# Patient Record
Sex: Male | Born: 1955 | Race: White | Hispanic: No | Marital: Single | Smoking: Current some day smoker
Health system: Southern US, Community
[De-identification: ages and names within clinical notes are randomized; demographics above are authoritative.]

## PROBLEM LIST (undated history)

## (undated) VITALS — BP 98/57 | HR 63 | Temp 97.7°F | Resp 18 | Ht 74.0 in | Wt 186.1 lb

## (undated) DIAGNOSIS — I519 Heart disease, unspecified: Secondary | ICD-10-CM

## (undated) DIAGNOSIS — M5136 Other intervertebral disc degeneration, lumbar region: Secondary | ICD-10-CM

## (undated) DIAGNOSIS — I5189 Other ill-defined heart diseases: Secondary | ICD-10-CM

## (undated) DIAGNOSIS — E785 Hyperlipidemia, unspecified: Secondary | ICD-10-CM

## (undated) DIAGNOSIS — I509 Heart failure, unspecified: Secondary | ICD-10-CM

## (undated) DIAGNOSIS — I200 Unstable angina: Secondary | ICD-10-CM

## (undated) DIAGNOSIS — Z7902 Long term (current) use of antithrombotics/antiplatelets: Secondary | ICD-10-CM

## (undated) DIAGNOSIS — I2510 Atherosclerotic heart disease of native coronary artery without angina pectoris: Secondary | ICD-10-CM

## (undated) DIAGNOSIS — M51369 Other intervertebral disc degeneration, lumbar region without mention of lumbar back pain or lower extremity pain: Secondary | ICD-10-CM

## (undated) DIAGNOSIS — I70229 Atherosclerosis of native arteries of extremities with rest pain, unspecified extremity: Secondary | ICD-10-CM

## (undated) DIAGNOSIS — I10 Essential (primary) hypertension: Secondary | ICD-10-CM

## (undated) DIAGNOSIS — I70221 Atherosclerosis of native arteries of extremities with rest pain, right leg: Secondary | ICD-10-CM

## (undated) HISTORY — PX: CORONARY ANGIOPLASTY: SHX604

---

## 1978-04-10 HISTORY — PX: HAND SURGERY: SHX662

## 1978-04-10 HISTORY — PX: OTHER SURGICAL HISTORY: SHX169

## 2011-09-02 ENCOUNTER — Emergency Department: Payer: Self-pay | Admitting: *Deleted

## 2014-08-09 ENCOUNTER — Inpatient Hospital Stay (HOSPITAL_COMMUNITY)
Admission: EM | Admit: 2014-08-09 | Discharge: 2014-08-12 | DRG: 247 | Disposition: A | Discharge status: Home / Self Care | Payer: Self-pay | Source: Other Acute Inpatient Hospital | Attending: Cardiovascular Disease | Admitting: Cardiovascular Disease

## 2014-08-09 ENCOUNTER — Other Ambulatory Visit: Payer: Self-pay

## 2014-08-09 ENCOUNTER — Emergency Department
Admission: EM | Admit: 2014-08-09 | Discharge: 2014-08-09 | Disposition: A | Discharge status: Other Acute Inpatient Hospital | Payer: Self-pay | Attending: Cardiovascular Disease | Admitting: Cardiovascular Disease

## 2014-08-09 ENCOUNTER — Encounter (HOSPITAL_COMMUNITY): Payer: Self-pay | Admitting: *Deleted

## 2014-08-09 ENCOUNTER — Encounter: Payer: Self-pay | Admitting: *Deleted

## 2014-08-09 ENCOUNTER — Encounter
Admission: EM | Disposition: A | Discharge status: Other Acute Inpatient Hospital | Payer: Self-pay | Source: Home / Self Care | Attending: Emergency Medicine

## 2014-08-09 DIAGNOSIS — I2129 ST elevation (STEMI) myocardial infarction involving other sites: Secondary | ICD-10-CM | POA: Insufficient documentation

## 2014-08-09 DIAGNOSIS — I2510 Atherosclerotic heart disease of native coronary artery without angina pectoris: Secondary | ICD-10-CM

## 2014-08-09 DIAGNOSIS — I213 ST elevation (STEMI) myocardial infarction of unspecified site: Secondary | ICD-10-CM | POA: Diagnosis present

## 2014-08-09 DIAGNOSIS — Z9861 Coronary angioplasty status: Secondary | ICD-10-CM | POA: Insufficient documentation

## 2014-08-09 DIAGNOSIS — F17200 Nicotine dependence, unspecified, uncomplicated: Secondary | ICD-10-CM

## 2014-08-09 DIAGNOSIS — Z886 Allergy status to analgesic agent status: Secondary | ICD-10-CM

## 2014-08-09 DIAGNOSIS — Z720 Tobacco use: Secondary | ICD-10-CM | POA: Insufficient documentation

## 2014-08-09 DIAGNOSIS — F17210 Nicotine dependence, cigarettes, uncomplicated: Secondary | ICD-10-CM | POA: Diagnosis present

## 2014-08-09 DIAGNOSIS — I2121 ST elevation (STEMI) myocardial infarction involving left circumflex coronary artery: Secondary | ICD-10-CM

## 2014-08-09 DIAGNOSIS — E876 Hypokalemia: Secondary | ICD-10-CM

## 2014-08-09 HISTORY — PX: CARDIAC CATHETERIZATION: SHX172

## 2014-08-09 HISTORY — PX: CORONARY ANGIOPLASTY: SHX604

## 2014-08-09 HISTORY — DX: ST elevation (STEMI) myocardial infarction involving other sites: I21.29

## 2014-08-09 LAB — CBC
HCT: 41.8 % (ref 40.0–52.0)
HCT: 37.4 % — ABNORMAL LOW (ref 39.0–52.0)
HEMOGLOBIN: 14 g/dL (ref 13.0–18.0)
HEMOGLOBIN: 12.6 g/dL — AB (ref 13.0–17.0)
MCH: 30.7 pg (ref 26.0–34.0)
MCH: 30.3 pg (ref 26.0–34.0)
MCHC: 33.5 g/dL (ref 32.0–36.0)
MCHC: 33.7 g/dL (ref 30.0–36.0)
MCV: 91.5 fL (ref 80.0–100.0)
MCV: 89.9 fL (ref 78.0–100.0)
PLATELETS: 187 10*3/uL (ref 150–440)
Platelets: 172 10*3/uL (ref 150–400)
RBC: 4.57 MIL/uL (ref 4.40–5.90)
RBC: 4.16 MIL/uL — AB (ref 4.22–5.81)
RDW: 12.9 % (ref 11.5–14.5)
RDW: 12.8 % (ref 11.5–15.5)
WBC: 7 10*3/uL (ref 3.8–10.6)
WBC: 5.7 10*3/uL (ref 4.0–10.5)

## 2014-08-09 LAB — COMPREHENSIVE METABOLIC PANEL
ALK PHOS: 83 U/L (ref 38–126)
ALT: 24 U/L (ref 17–63)
ANION GAP: 11 (ref 5–15)
AST: 46 U/L — ABNORMAL HIGH (ref 15–41)
Albumin: 4.4 g/dL (ref 3.5–5.0)
BUN: 12 mg/dL (ref 6–20)
CHLORIDE: 100 mmol/L — AB (ref 101–111)
CO2: 28 mmol/L (ref 22–32)
Calcium: 11.1 mg/dL — ABNORMAL HIGH (ref 8.9–10.3)
Creatinine, Ser: 1.16 mg/dL (ref 0.61–1.24)
GFR calc Af Amer: >60 mL/min (ref >60)
GLUCOSE: 99 mg/dL (ref 65–99)
POTASSIUM: 3.1 mmol/L — AB (ref 3.5–5.1)
SODIUM: 139 mmol/L (ref 135–145)
Total Bilirubin: 1 mg/dL (ref 0.3–1.2)
Total Protein: 7.4 g/dL (ref 6.5–8.1)

## 2014-08-09 LAB — CREATININE, SERUM
Creatinine, Ser: 1.14 mg/dL (ref 0.61–1.24)
GFR calc Af Amer: >60 mL/min (ref >60)
GFR calc non Af Amer: >60 mL/min (ref >60)

## 2014-08-09 LAB — MRSA PCR SCREENING: MRSA by PCR: NEGATIVE

## 2014-08-09 LAB — APTT: aPTT: 29 seconds (ref 24–36)

## 2014-08-09 LAB — TROPONIN I: TROPONIN I: 2.25 ng/mL — AB (ref <0.031)

## 2014-08-09 SURGERY — CORONARY STENT INTERVENTION

## 2014-08-09 MED ORDER — BIVALIRUDIN 250 MG IV SOLR
INTRAVENOUS | Status: DC | PRN
  Administered 2014-08-09: 142.8 mg via INTRAVENOUS

## 2014-08-09 MED ORDER — TICAGRELOR 90 MG PO TABS
180.0000 mg | ORAL_TABLET | ORAL | Status: AC
  Administered 2014-08-09: 180 mg via ORAL

## 2014-08-09 MED ORDER — ONDANSETRON HCL 4 MG/2ML IJ SOLN: 4.0000 mg | Freq: Four times a day (QID) | INTRAMUSCULAR | Status: DC | PRN

## 2014-08-09 MED ORDER — SODIUM CHLORIDE 0.9 % IV SOLN
INTRAVENOUS | Status: DC
  Administered 2014-08-09: 20 mL via INTRAVENOUS

## 2014-08-09 MED ORDER — TICAGRELOR 90 MG PO TABS
ORAL_TABLET | ORAL | Status: AC
  Filled 2014-08-09: qty 2

## 2014-08-09 MED ORDER — VERAPAMIL HCL 2.5 MG/ML IV SOLN
INTRAVENOUS | Status: DC | PRN
  Administered 2014-08-09: 2.5 mg via INTRA_ARTERIAL

## 2014-08-09 MED ORDER — ACETAMINOPHEN 325 MG PO TABS: 650.0000 mg | ORAL_TABLET | ORAL | Status: DC | PRN

## 2014-08-09 MED ORDER — ALPRAZOLAM 0.25 MG PO TABS: 0.2500 mg | ORAL_TABLET | Freq: Two times a day (BID) | ORAL | Status: DC | PRN

## 2014-08-09 MED ORDER — NITROGLYCERIN 1 MG/10 ML FOR IR/CATH LAB
INTRA_ARTERIAL | Status: DC | PRN
  Administered 2014-08-09: 100 ug via INTRA_ARTERIAL
  Administered 2014-08-09: 200 ug via INTRA_ARTERIAL

## 2014-08-09 MED ORDER — SODIUM CHLORIDE 0.9 % IV SOLN: 3.0000 mL/kg/h | INTRAVENOUS | Status: AC

## 2014-08-09 MED ORDER — SODIUM CHLORIDE 0.9 % IJ SOLN
3.0000 mL | INTRAMUSCULAR | Status: DC | PRN
Start: 2014-08-09 — End: 2014-08-12

## 2014-08-09 MED ORDER — FENTANYL BOLUS VIA INFUSION
INTRAVENOUS | Status: DC | PRN
  Administered 2014-08-09: 50 ug via INTRAVENOUS

## 2014-08-09 MED ORDER — BIVALIRUDIN 250 MG IV SOLR
INTRAVENOUS | Status: DC | PRN
  Administered 2014-08-09: 20.4 mg via INTRAVENOUS

## 2014-08-09 MED ORDER — HEPARIN SODIUM (PORCINE) 5000 UNIT/ML IJ SOLN
4000.0000 [IU] | INTRAMUSCULAR | Status: AC
  Administered 2014-08-09: 4000 [IU] via INTRAVENOUS

## 2014-08-09 MED ORDER — TICAGRELOR 90 MG PO TABS: 90.0000 mg | ORAL_TABLET | Freq: Two times a day (BID) | ORAL | Status: DC

## 2014-08-09 MED ORDER — SODIUM CHLORIDE 0.9 % IV SOLN
0.1500 mg/kg/h | INTRAVENOUS | Status: DC
  Filled 2014-08-09: qty 250

## 2014-08-09 MED ORDER — HEPARIN SODIUM (PORCINE) 1000 UNIT/ML IJ SOLN
INTRAMUSCULAR | Status: DC | PRN
Start: 2014-08-09 — End: 2014-08-09
  Administered 2014-08-09: 2000 [IU] via INTRAVENOUS

## 2014-08-09 MED ORDER — MORPHINE SULFATE 4 MG/ML IJ SOLN: 4.0000 mg | INTRAMUSCULAR | Status: DC | PRN

## 2014-08-09 MED ORDER — IOHEXOL 350 MG/ML SOLN
INTRAVENOUS | Status: DC | PRN
  Administered 2014-08-09: 100 mL via INTRA_ARTERIAL
  Administered 2014-08-09: 125 mL via INTRA_ARTERIAL

## 2014-08-09 MED ORDER — TICAGRELOR 90 MG PO TABS
90.0000 mg | ORAL_TABLET | Freq: Two times a day (BID) | ORAL | Status: DC
  Administered 2014-08-09 – 2014-08-12 (×6): 90 mg via ORAL
  Filled 2014-08-09 (×7): qty 1

## 2014-08-09 MED ORDER — MORPHINE SULFATE 4 MG/ML IJ SOLN
INTRAMUSCULAR | Status: AC
  Administered 2014-08-09: 15:00:00
  Filled 2014-08-09: qty 1

## 2014-08-09 MED ORDER — ZOLPIDEM TARTRATE 5 MG PO TABS: 5.0000 mg | ORAL_TABLET | Freq: Every evening | ORAL | Status: DC | PRN

## 2014-08-09 MED ORDER — BIVALIRUDIN BOLUS VIA INFUSION
INTRAVENOUS | Status: DC | PRN
  Administered 2014-08-09: .75 mg/kg/h via INTRAVENOUS

## 2014-08-09 MED ORDER — ATORVASTATIN CALCIUM 80 MG PO TABS
80.0000 mg | ORAL_TABLET | Freq: Every day | ORAL | Status: DC
  Administered 2014-08-09 – 2014-08-11 (×3): 80 mg via ORAL
  Filled 2014-08-09 (×4): qty 1

## 2014-08-09 MED ORDER — SODIUM CHLORIDE 0.9 % IJ SOLN: 3.0000 mL | Freq: Two times a day (BID) | INTRAMUSCULAR | Status: DC

## 2014-08-09 MED ORDER — LISINOPRIL 5 MG PO TABS: 5.0000 mg | ORAL_TABLET | Freq: Every day | ORAL | Status: DC

## 2014-08-09 MED ORDER — POTASSIUM CHLORIDE CRYS ER 20 MEQ PO TBCR: 20.0000 meq | EXTENDED_RELEASE_TABLET | Freq: Once | ORAL | Status: DC

## 2014-08-09 MED ORDER — BIVALIRUDIN 250 MG IV SOLR
0.2500 mg/kg/h | INTRAVENOUS | Status: DC
  Filled 2014-08-09: qty 250

## 2014-08-09 MED ORDER — CARVEDILOL 3.125 MG PO TABS: 3.1250 mg | ORAL_TABLET | Freq: Two times a day (BID) | ORAL | Status: DC

## 2014-08-09 MED ORDER — MIDAZOLAM HCL 2 MG/2ML IJ SOLN
INTRAMUSCULAR | Status: DC | PRN
  Administered 2014-08-09: 1 mg via INTRAVENOUS

## 2014-08-09 MED ORDER — HEPARIN SODIUM (PORCINE) 1000 UNIT/ML IJ SOLN
INTRAMUSCULAR | Status: DC | PRN
  Administered 2014-08-09: 2000 [IU] via INTRAVENOUS

## 2014-08-09 MED ORDER — SODIUM CHLORIDE 0.9 % IV SOLN: 250.0000 mL | INTRAVENOUS | Status: DC | PRN

## 2014-08-09 MED ORDER — SODIUM CHLORIDE 0.9 % IV SOLN: INTRAVENOUS | Status: DC

## 2014-08-09 MED ORDER — ATORVASTATIN CALCIUM 20 MG PO TABS: 80.0000 mg | ORAL_TABLET | Freq: Every day | ORAL | Status: DC

## 2014-08-09 MED ORDER — HEPARIN SODIUM (PORCINE) 5000 UNIT/ML IJ SOLN
5000.0000 [IU] | Freq: Three times a day (TID) | INTRAMUSCULAR | Status: DC
  Administered 2014-08-10 – 2014-08-12 (×7): 5000 [IU] via SUBCUTANEOUS
  Filled 2014-08-09 (×10): qty 1

## 2014-08-09 MED ORDER — SODIUM CHLORIDE 0.9 % IJ SOLN: 3.0000 mL | INTRAMUSCULAR | Status: DC | PRN

## 2014-08-09 SURGICAL SUPPLY — 21 items
BALLN EUPHORA RX 2.5X12 (BALLOONS) ×3
BALLN ~~LOC~~ EUPHORA RX 3.5X8 (BALLOONS) ×3
BALLOON EUPHORA RX 2.5X12 (BALLOONS) ×1 IMPLANT
BALLOON ~~LOC~~ EUPHORA RX 3.5X8 (BALLOONS) ×1 IMPLANT
CATH HEARTRAIL 6F IL3.5 (CATHETERS) ×3 IMPLANT
CATH INFINITI 5FR ANG PIGTAIL (CATHETERS) ×3 IMPLANT
CATH INFINITI 5FR MULTPACK ANG (CATHETERS) IMPLANT
CATH OPTITORQUE JACKY 4.0 5F (CATHETERS) ×3 IMPLANT
DEVICE RAD COMP TR BAND LRG (VASCULAR PRODUCTS) ×3 IMPLANT
GLIDESHEATH SLEND SS 6F .021 (SHEATH) ×3 IMPLANT
KIT ENCORE 26 ADVANTAGE (KITS) ×3 IMPLANT
KIT HEART LEFT (KITS) ×3 IMPLANT
PACK CARDIAC CATHETERIZATION (CUSTOM PROCEDURE TRAY) ×3 IMPLANT
SHEATH PINNACLE 5F 10CM (SHEATH) IMPLANT
STENT RESOLUTE INTEG 3.0X15 (Permanent Stent) ×3 IMPLANT
SYR MEDRAD MARK V 150ML (SYRINGE) ×3 IMPLANT
TRANSDUCER W/STOPCOCK (MISCELLANEOUS) ×3 IMPLANT
TUBING CIL FLEX 10 FLL-RA (TUBING) ×3 IMPLANT
WIRE EMERALD 3MM-J .035X150CM (WIRE) IMPLANT
WIRE INTUITION PROPEL ST 180CM (WIRE) ×3 IMPLANT
WIRE SAFE-T 1.5MM-J .035X260CM (WIRE) ×3 IMPLANT

## 2014-08-09 NOTE — ED Notes (Signed)
Pt left ED via  EMS, report given to Cath lab at [facility].

## 2014-08-09 NOTE — ED Provider Notes (Signed)
[facility] Emergency Department Provider Note    ____________________________________________  Time seen: 2:52 PM I have reviewed the triage vital signs and the nursing notes.   HISTORY  Chief Complaint Chest Pain   EM caveat. History of exam is limited by acuity of condition. Patient presented with acute cardiovascular event requiring immediate stabilization and transfer.    HPI Aaron Knox is a 59 y.o. male presents with sudden onset of 30 minutes of substernal chest pain rated as a 10 out of 10. Pain is nonradiating, severe, associated with mild dyspnea sweats and nausea.  EM caveat history of present illness review systems exam is limited due to acuity of condition or need for emergent transfer   No past medical history on file.  There are no active problems to display for this patient.   No past surgical history on file.  No current outpatient prescriptions on file.  Allergies Review of patient's allergies indicates not on file.  No family history on file.  Social History History  Substance Use Topics  . Smoking status: Not on file  . Smokeless tobacco: Not on file  . Alcohol Use: Not on file   patient reports he is a long-standing smoker.    Review of Systems   EM caveat  Severe substernal chest pain starting 30 minutes ago as noted in history of present illness. Patient denies history of bleeding disorder. Patient does not take any medications for erectile dysfunction. Patient denies any other acute conditions in his normal state of health until approximately 30 minutes ago.    ____________________________________________   PHYSICAL EXAM:  VITAL SIGNS: ED Triage Vitals  Enc Vitals Group     BP --      Pulse --      Resp --      Temp --      Temp src --      SpO2 --      Weight --      Height --      Head Cir --      Peak Flow --      Pain Score --      Pain Loc --      Pain Edu? --      Excl. in GC? --       Constitutional: Alert and oriented. Well appearing and in no distress. Cardiovascular: Normal rate, regular rhythm. Normal and symmetric distal pulses are present in all extremities. No murmurs, rubs, or gallops. Respiratory: Normal respiratory effort without tachypnea nor retractions. Breath sounds are clear and equal bilaterally. No wheezes/rales/rhonchi. Gastrointestinal: Soft and nontender. No distention. No abdominal bruits. There is no CVA tenderness.Neurologic:  Normal speech and language. No gross focal neurologic deficits are appreciated. Speech is normal. No gait instability. Skin:  Skin is warm, dry and intact. No rash noted. Psychiatric: Mood and affect are normal. Speech and behavior are normal. Patient exhibits appropriate insight and judgment.  ____________________________________________   EKG  EKG demonstrates acute ST elevation MI. Heart rate 114. A 144 QRS 86 QTc 485 demonstrates diffuse ST depressions in V2 through V6 greater than 2 mm. This appears to be consistent with acute posterior myocardial infarction. _Discussed with Dr. Sloan Leiter  RADIOLOGY    258p discussed with Dr. Romilda Joy at [facility] accepted in transfer for STEMI service. ____________________________________________   PROCEDURES  Procedure(s) performed: None  Critical Care performed: Yes, see critical care note(s)  CRITICAL CARE Performed by: Sharyn Creamer   Total  critical care time: 35  Critical care time was exclusive of separately billable procedures and treating other patients.  Critical care was necessary to treat or prevent imminent or life-threatening deterioration.  Critical care was time spent personally by me on the following activities: development of treatment plan with patient and/or surrogate as well as nursing, discussions with consultants, evaluation of patient's response to treatment, examination of patient, obtaining history from patient or surrogate, ordering  and performing treatments and interventions, ordering and review of laboratory studies, ordering and review of radiographic studies, pulse oximetry and re-evaluation of patient's condition.   ____________________________________________   INITIAL IMPRESSION / ASSESSMENT AND PLAN / ED COURSE  Pertinent labs & imaging results that were available during my care of the patient were reviewed by me and considered in my medical decision making (see chart for details).  Patient presentation appears most consistent with acute ST elevation MI. Catheter lab is not open at [facility] at this time. Discussed with interventional cardiology, given patient's allergy to aspirin we will treat him with Brilinta, heparin, morphine, nitroglycerin and [facility].  Discussed the risks of transfer and the benefits with the patient who is very agreeable to transfer given his acute heart attack concern.  ----------------------------------------- 3:07 PM on 08/09/2014 ----------------------------------------- Patient vital signs remained stable pain is slightly improved however ongoing at this time. Patient has received heparin, morphine, nitroglycerin, Brilinta. Blood pressure 159/99, pulse 123, temperature 97.6 F (36.4 C), temperature source Oral, resp. rate 20, height 6\' 2"  (1.88 m), weight 180 lb (81.647 kg), SpO2 100 %.      ____________________________________________   FINAL CLINICAL IMPRESSION(S) / ED DIAGNOSES  Final diagnoses:  ST elevation myocardial infarction (STEMI) of true posterior wall, initial episode of care     Sharyn CreamerMark Cason Dabney, MD 08/09/14 [phone]

## 2014-08-09 NOTE — ED Notes (Signed)
Pt to ED from home with onset of right substernal chest pain that began this morning around 1130. Pt denies n/v/d. On assesment pt is AAOx3, lethargic, pale, SOB. Pain 10/10. Pt tachy at 135-155. MD quale at bedside

## 2014-08-09 NOTE — ED Notes (Signed)
CRITICAL VALUE ALERT  Critical value received:  Troponin 2.25  Date of notification:  08/09/14  Time of notification:  1535  Critical value read back:Yes.    Nurse who received alert:  Junie PanningLauryn RN  MD notified (1st page):  MD Fanny BienQuale

## 2014-08-09 NOTE — H&P (Signed)
History and Physical  Patient ID: Aaron Knox MRN: [id number] DOB/AGE: 61-Apr-1957 61 y.o. Admit date: 08/09/2014  Primary Care Physician: No primary care provider on file. Primary Cardiologist : New (will follow up with Dr. Kirke Corin in [place])  HPI:  This is a 59 year old male with no previous cardiac history. He has prolonged history of tobacco use and has been smoking one and a half pack per day since he was 59 years old. He has no chronic medical conditions and does not have a primary care physician. He does not take any medications. He started having intermittent chest pain on Wednesday which resolved without intervention. This continued since then but was not constant. However, today around 11 PM, he started having severe substernal chest tightness associated with shortness of breath with no radiation. The pain was severe and thus he went [facility] the emergency room at [facility]. His ECG showed evidence of posterior myocardial infarction on thus he was transferred emergently. The patient received heparin and Bilinta. He reports allergy to aspirin with previous throat swelling. By the time he arrived to the cath lab, his chest pain was down to 2 out of 10.  Review of systems complete and found to be negative unless listed above  History reviewed. No pertinent past medical history.  History reviewed. No pertinent family history.  History   Social History  . Marital Status: Single    Spouse Name: N/A  . Number of Children: N/A  . Years of Education: N/A   Occupational History  . Not on file.   Social History Main Topics  . Smoking status: Current Every Day Smoker -- 1.00 packs/day    Types: Cigarettes  . Smokeless tobacco: Not on file  . Alcohol Use: 1.8 oz/week    3 Glasses of wine per week  . Drug Use: No  . Sexual Activity: Not on file   Other Topics Concern  . Not on file   Social History Narrative    Past Surgical History  Procedure Laterality Date  . Coronary  angioplasty        (Not in a hospital admission)  Physical Exam: Blood pressure 145/95, pulse 98, temperature 97.6 F (36.4 C), temperature source Oral, resp. rate 25, height  (1.88 m), weight 81.647 kg (180 lb), SpO2 100 %.  Constitutional: He is oriented to person, place, and time. He appears well-developed and well-nourished. No distress.  HENT: No nasal discharge.  Head: Normocephalic and atraumatic.  Eyes: Pupils are equal and round.  No discharge. Neck: Normal range of motion. Neck supple. No JVD present. No thyromegaly present.  Cardiovascular: Normal rate, regular rhythm, normal heart sounds. Exam reveals no gallop and no friction rub. No murmur heard.  Pulmonary/Chest: Effort normal and breath sounds normal. No stridor. No respiratory distress. He has no wheezes. He has no rales. He exhibits no tenderness.  Abdominal: Soft. Bowel sounds are normal. He exhibits no distension. There is no tenderness. There is no rebound and no guarding.  Musculoskeletal: Normal range of motion. He exhibits no edema and no tenderness.  Neurological: He is alert and oriented to person, place, and time. Coordination normal.  Skin: Skin is warm and dry. No rash noted. He is not diaphoretic. No erythema. No pallor.  Psychiatric: He has a normal mood and affect. His behavior is normal. Judgment and thought content normal.      Labs:   Lab Results  Component Value Date   WBC 7.0 08/09/2014   HGB 14.0 08/09/2014  HCT 41.8 08/09/2014   MCV 91.5 08/09/2014   PLT 187 08/09/2014    Recent Labs Lab 08/09/14 1452  NA 139  K 3.1*  CL 100*  CO2 28  BUN 12  CREATININE 1.16  CALCIUM 11.1*  PROT 7.4  BILITOT 1.0  ALKPHOS 83  ALT 24  AST 46*  GLUCOSE 99   Lab Results  Component Value Date   TROPONINI 2.25* 08/09/2014   Radiology: No results found.  EKG:  Sinus tachycardia with significant ST depression in the precordial leads suggestive of posterior myocardial  infarction.  ASSESSMENT AND PLAN:   1. Posterior ST elevation myocardial infarction: Emergent cardiac catheterization was done via the right radial artery which showed 99% subtotal occlusion in the proximal left circumflex which was the culprit for presentation. He underwent successful angioplasty and drug-eluting stent placement. He has residual mild to moderate coronary artery disease in the LAD, left circumflex and RCA. Given that he is truly allergic to aspirin, he was treated with Bilinta monotherapy. This should be continued for a minimum of 12 months with the option of switching to clopidogrel after that. I started high dose atorvastatin.  Aggressive treatment of risk factors recommended.  2. Ischemic cardiomyopathy with an ejection fraction of 40%: I started small dose carvedilol and lisinopril. I ordered an echocardiogram.  3. Tobacco use: I strongly advised him to quit smoking.  Signed: Lorine BearsMuhammad Katey Barrie MD, [facility] 08/09/2014, 5:18 PM

## 2014-08-10 ENCOUNTER — Encounter (HOSPITAL_COMMUNITY): Payer: Self-pay | Admitting: Cardiovascular Disease

## 2014-08-10 DIAGNOSIS — I2121 ST elevation (STEMI) myocardial infarction involving left circumflex coronary artery: Secondary | ICD-10-CM

## 2014-08-10 LAB — CBC
HEMATOCRIT: 36.1 % — AB (ref 39.0–52.0)
Hemoglobin: 12.3 g/dL — ABNORMAL LOW (ref 13.0–17.0)
MCH: 31 pg (ref 26.0–34.0)
MCHC: 34.1 g/dL (ref 30.0–36.0)
MCV: 90.9 fL (ref 78.0–100.0)
Platelets: 160 10*3/uL (ref 150–400)
RBC: 3.97 MIL/uL — ABNORMAL LOW (ref 4.22–5.81)
RDW: 12.7 % (ref 11.5–15.5)
WBC: 6.1 10*3/uL (ref 4.0–10.5)

## 2014-08-10 LAB — BASIC METABOLIC PANEL
Anion gap: 9 (ref 5–15)
BUN: 8 mg/dL (ref 6–20)
CHLORIDE: 104 mmol/L (ref 101–111)
CO2: 26 mmol/L (ref 22–32)
CREATININE: 1.18 mg/dL (ref 0.61–1.24)
Calcium: 9.5 mg/dL (ref 8.9–10.3)
GFR calc Af Amer: >60 mL/min (ref >60)
Glucose, Bld: 101 mg/dL — ABNORMAL HIGH (ref 70–99)
Potassium: 3.4 mmol/L — ABNORMAL LOW (ref 3.5–5.1)
Sodium: 139 mmol/L (ref 135–145)

## 2014-08-10 LAB — POCT ACTIVATED CLOTTING TIME: Activated Clotting Time: 589 seconds

## 2014-08-10 MED ORDER — POTASSIUM CHLORIDE CRYS ER 20 MEQ PO TBCR
40.0000 meq | EXTENDED_RELEASE_TABLET | Freq: Two times a day (BID) | ORAL | Status: DC
  Administered 2014-08-10 – 2014-08-11 (×3): 40 meq via ORAL
  Filled 2014-08-10 (×4): qty 2

## 2014-08-10 MED FILL — Midazolam HCl Inj 2 MG/2ML (Base Equivalent): INTRAMUSCULAR | Qty: 2 | Status: AC

## 2014-08-10 MED FILL — Bivalirudin For IV Soln 250 MG: INTRAVENOUS | Qty: 250 | Status: AC

## 2014-08-10 MED FILL — Fentanyl Citrate Preservative Free (PF) Inj 100 MCG/2ML: INTRAMUSCULAR | Qty: 2 | Status: AC

## 2014-08-10 MED FILL — Nitroglycerin IV Soln 100 MCG/ML in D5W: INTRA_ARTERIAL | Qty: 10 | Status: AC

## 2014-08-10 MED FILL — Verapamil HCl IV Soln 2.5 MG/ML: INTRAVENOUS | Qty: 2 | Status: AC

## 2014-08-10 NOTE — Care Management Note (Addendum)
Case Management Note  Patient Details  Name: Aaron Knox MRN: [id number] Date of Birth: [birth year]  Subjective/Objective:                    Action/Plan:   Expected Discharge Date:                  Expected Discharge Plan:  Home/Self Care  In-House Referral:     Discharge planning Services  CM Consult, Medication Assistance, Indigent Health Clinic  Post Acute Care Choice:    Choice offered to:     DME Arranged:    DME Agency:     HH Arranged:    HH Agency:     Status of Service:  Completed, signed off  Medicare Important Message Given:    Date Medicare IM Given:    Medicare IM give by:    Date Additional Medicare IM Given:    Additional Medicare Important Message give by:     If discussed at Long Length of Stay Meetings, dates discussed:    Additional Comments: spoke w pt. 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 Gave pt 30day brilinta card. Placed brilinta pt assist form on shadow chart. Gave pt inform on [place] [facility] where he could establish for pcp.  Hanley Haysowell, Kayron Kalmar T, RN 08/10/2014, 10:00 AM

## 2014-08-10 NOTE — Progress Notes (Signed)
CARDIAC REHAB PHASE I   PRE:  Rate/Rhythm: 70 SR    BP: sitting 113/71 (just out of bathroom)    SaO2:   MODE:  Ambulation: 350 ft   POST:  Rate/Rhythm: 98 SR    BP: sitting 96/60     SaO2:   Pt eager to walk. Denies c/o. No SOB or dizziness. To bed, BP low. Began discussing MI/stent. Reinforced Brilinta. Pt is wanting to quit smoking and discussed ways to quit. Will f/u tomorrow for more ed. [id number]  Aaron LovettReeve, Aaron Knox Aaron Knox CES, ACSM 08/10/2014 1:36 PM

## 2014-08-10 NOTE — Progress Notes (Signed)
Patient ID: Aaron BleacherGary W Helmes, male   DOB: 01-24-1956, 59 y.o.   MRN: [id number]    Subjective:  Denies SSCP, palpitations or Dyspnea Allergic to aspirin.    Objective:  Filed Vitals:   08/10/14 0600 08/10/14 0630 08/10/14 0700 08/10/14 0705  BP: 86/54 82/50 63/32  83/62  Pulse: 68 59 62 71  Temp:      TempSrc:      Resp: 15 17 19 20   Height:      Weight:      SpO2: 97% 96% 96% 98%    Intake/Output from previous day:  Intake/Output Summary (Last 24 hours) at 08/10/14 [id number] Last data filed at 08/10/14 0800  Gross per 24 hour  Intake    826 ml  Output   1150 ml  Net   -324 ml    Physical Exam: Affect appropriate Healthy:  appears stated age HEENT: normal Neck supple with no adenopathy JVP normal no bruits no thyromegaly Lungs clear with no wheezing and good diaphragmatic motion Heart:  S1/S2 no murmur, no rub, gallop or click PMI normal Abdomen: benighn, BS positve, no tenderness, no AAA no bruit.  No HSM or HJR Distal pulses intact with no bruits No edema Neuro non-focal Skin warm and dry No muscular weakness   Lab Results: Basic Metabolic Panel:  Recent Labs  56/21/3003/04/26 1452 08/09/14 2039 08/10/14 0242  NA 139  --  139  K 3.1*  --  3.4*  CL 100*  --  104  CO2 28  --  26  GLUCOSE 99  --  101*  BUN 12  --  8  CREATININE 1.16 1.14 1.18  CALCIUM 11.1*  --  9.5   Liver Function Tests:  Recent Labs  08/09/14 1452  AST 46*  ALT 24  ALKPHOS 83  BILITOT 1.0  PROT 7.4  ALBUMIN 4.4   CBC:  Recent Labs  08/09/14 2039 08/10/14 0242  WBC 5.7 6.1  HGB 12.6* 12.3*  HCT 37.4* 36.1*  MCV 89.9 90.9  PLT 172 160   Cardiac Enzymes:  Recent Labs  08/09/14 1452  TROPONINI 2.25*   BNP: Invalid input(s): POCBNP D-Dimer: No results for input(s): DDIMER in the last 72 hours. Hemoglobin A1C: No results for input(s): HGBA1C in the last 72 hours. Fasting Lipid Panel: No results for input(s): CHOL, HDL, LDLCALC, TRIG, CHOLHDL, LDLDIRECT in the last  72 hours. Thyroid Function Tests: No results for input(s): TSH, T4TOTAL, T3FREE, THYROIDAB in the last 72 hours.  Invalid input(s): FREET3 Anemia Panel: No results for input(s): VITAMINB12, FOLATE, FERRITIN, TIBC, IRON, RETICCTPCT in the last 72 hours.  Imaging: No results found.  Cardiac Studies:  ECG:  SR QT 537c  Inferolateral T wave changes   Telemetry:  SB rate 60 no VT   Echo:  pending  Medications:   . atorvastatin  80 mg Oral q1800  . heparin  5,000 Units Subcutaneous 3 times per day  . ticagrelor  90 mg Oral BID       Assessment/Plan:  CAD:  Posterior MI no murmur Echo pending allergic to aspirin ticagrelor monoRx.  No beta blocker due to bradycardia Smoking :  Discussed cessation no nicotine patch in setting of acute MI Chol:  On statin  Tentative d/c 48 hrs  Charlton Hawseter Masaji Billups 08/10/2014, 8:33 AM

## 2014-08-11 ENCOUNTER — Inpatient Hospital Stay: Payer: MEDICAID

## 2014-08-11 ENCOUNTER — Inpatient Hospital Stay (HOSPITAL_COMMUNITY): Payer: Self-pay

## 2014-08-11 DIAGNOSIS — E876 Hypokalemia: Secondary | ICD-10-CM

## 2014-08-11 DIAGNOSIS — I2129 ST elevation (STEMI) myocardial infarction involving other sites: Principal | ICD-10-CM

## 2014-08-11 DIAGNOSIS — I2510 Atherosclerotic heart disease of native coronary artery without angina pectoris: Secondary | ICD-10-CM

## 2014-08-11 DIAGNOSIS — F17200 Nicotine dependence, unspecified, uncomplicated: Secondary | ICD-10-CM

## 2014-08-11 LAB — BASIC METABOLIC PANEL
ANION GAP: 10 (ref 5–15)
BUN: 11 mg/dL (ref 6–20)
CO2: 26 mmol/L (ref 22–32)
Calcium: 9.7 mg/dL (ref 8.9–10.3)
Chloride: 105 mmol/L (ref 101–111)
Creatinine, Ser: 1.07 mg/dL (ref 0.61–1.24)
GFR calc Af Amer: >60 mL/min (ref >60)
GFR calc non Af Amer: >60 mL/min (ref >60)
GLUCOSE: 91 mg/dL (ref 70–99)
POTASSIUM: 4.6 mmol/L (ref 3.5–5.1)
SODIUM: 141 mmol/L (ref 135–145)

## 2014-08-11 MED FILL — Heparin Sodium (Porcine) 2 Unit/ML in Sodium Chloride 0.9%: INTRAMUSCULAR | Qty: 1500 | Status: AC

## 2014-08-11 MED FILL — Lidocaine HCl Local Preservative Free (PF) Inj 1%: INTRAMUSCULAR | Qty: 30 | Status: AC

## 2014-08-11 NOTE — Progress Notes (Signed)
Echocardiogram 2D Echocardiogram has been performed.  Dorothey BasemanReel, Skyy Mcknight M 08/11/2014, 12:12 PM

## 2014-08-11 NOTE — Progress Notes (Signed)
CARDIAC REHAB PHASE I   PRE:  Rate/Rhythm: 62 SR    BP: sitting 105/62    SaO2:   MODE:  Ambulation: 700 ft   POST:  Rate/Rhythm: 108 ST with fast week    BP: sitting 112/70     SaO2:   Pt wanted to walk fast pace, able to do so twice around unit without sx. Sts he feels normal. HR 108 ST, BP stable. Ed completed. Pt motivated to quit smoking. Not interested in CRPII as he wants to ex on his own. [id number]   Elissa LovettReeve, Cresencio Reesor ValmyKristan CES, ACSM 08/11/2014 1:15 PM

## 2014-08-11 NOTE — Progress Notes (Signed)
Patient ID: Aaron BleacherGary W Knox, male   DOB: June 04, 1955, 59 y.o.   MRN: [id number]    Subjective:  [name] was seen and examined this morning.  He denies CP, palpitation, dyspnea or lightheadedness.  His appetite, bowel and bladder are normal.  He ambulated around unit yesterday without problem.   Objective:  Filed Vitals:   08/11/14 0000 08/11/14 0200 08/11/14 0400 08/11/14 0600  BP: 89/56 84/43 101/57 96/49  Pulse:      Temp: 98.4 F (36.9 C)  98.2 F (36.8 C)   TempSrc: Oral  Oral   Resp:      Height:      Weight:      SpO2: 95%  96%     Intake/Output from previous day:  Intake/Output Summary (Last 24 hours) at 08/11/14 0810 Last data filed at 08/10/14 2000  Gross per 24 hour  Intake    860 ml  Output      0 ml  Net    860 ml    Physical Exam: General: sitting up in chair, in NAD, pleasant and cooperative with exam HEENT: [name], MMM, no JVD, no thyromegaly, no carotid bruits Cardiac: RRR, no rubs, murmurs or gallops; distal pulses intact and equal Pulm: clear to auscultation bilaterally, moving normal volumes of air, no wheezes/rales/rhonchi Abd: soft, nontender, nondistended, BS present Ext: warm and well perfused, no pedal edema Neuro: alert and oriented X3, moving extremities spontaneously, 5/5 MMS upper and lower extremities  Lab Results: Basic Metabolic Panel:  Recent Labs  40/98/1103/04/26 1452 08/09/14 2039 08/10/14 0242  NA 139  --  139  K 3.1*  --  3.4*  CL 100*  --  104  CO2 28  --  26  GLUCOSE 99  --  101*  BUN 12  --  8  CREATININE 1.16 1.14 1.18  CALCIUM 11.1*  --  9.5   Liver Function Tests:  Recent Labs  08/09/14 1452  AST 46*  ALT 24  ALKPHOS 83  BILITOT 1.0  PROT 7.4  ALBUMIN 4.4   CBC:  Recent Labs  08/09/14 2039 08/10/14 0242  WBC 5.7 6.1  HGB 12.6* 12.3*  HCT 37.4* 36.1*  MCV 89.9 90.9  PLT 172 160   Cardiac Enzymes:  Recent Labs  08/09/14 1452  TROPONINI 2.25*   BNP: Invalid input(s): POCBNP D-Dimer: No  results for input(s): DDIMER in the last 72 hours. Hemoglobin A1C: No results for input(s): HGBA1C in the last 72 hours. Fasting Lipid Panel: No results for input(s): CHOL, HDL, LDLCALC, TRIG, CHOLHDL, LDLDIRECT in the last 72 hours. Thyroid Function Tests: No results for input(s): TSH, T4TOTAL, T3FREE, THYROIDAB in the last 72 hours.  Invalid input(s): FREET3 Anemia Panel: No results for input(s): VITAMINB12, FOLATE, FERRITIN, TIBC, IRON, RETICCTPCT in the last 72 hours.  Imaging: No results found.  Cardiac Studies:  ECG:  SR QT 537c  Inferolateral T wave changes             Telemetry:  Sinus 70-90s no VT   Echo:  pending  Medications:   . atorvastatin  80 mg Oral q1800  . heparin  5,000 Units Subcutaneous 3 times per day  . potassium chloride  40 mEq Oral BID  . ticagrelor  90 mg Oral BID       Assessment/Plan:  Posterior MI s/p DES LCX:  He is on ticagrelor because of ASA allergy.  He is not on a beta blocker due to bradycardia.  Atorvastatin 80mg  was started this  admission.  There are no murmurs on cardiac exam.  2D ECHO is pending.  Will repeat EKG to check QTc.  He is stable for transfer to telemetry today with expected discharge home tomorrow. Tobacco abuse:  Encouraged cessation.  Patient interested in quitting.  No nicotine patch in setting of acute MI. Hypokalemia:  Improving but still low at 3.4.  Continue BID po K supplement while inpatient.  Recheck BMP tomorrow AM.  Doubt he will need continued supplementation at discharge.  Tentative d/c 24 hrs  Evelena Peat, DO IMTS, PGY2 08/11/2014, 8:10 AM   Patient examined chart reviewed  Suspect EF about 45%  RAO v gram would not show lateral infarcted wall No pain transfer to floor.  Tent d/c in am.  Discussed smoking cessation  Supple K no murmur on exam  Charlton Haws

## 2014-08-12 LAB — BASIC METABOLIC PANEL
ANION GAP: 9 (ref 5–15)
BUN: 13 mg/dL (ref 6–20)
CALCIUM: 8.9 mg/dL (ref 8.9–10.3)
CO2: 25 mmol/L (ref 22–32)
Chloride: 102 mmol/L (ref 101–111)
Creatinine, Ser: 0.99 mg/dL (ref 0.61–1.24)
GFR calc non Af Amer: >60 mL/min (ref >60)
Glucose, Bld: 99 mg/dL (ref 70–99)
Potassium: 3.8 mmol/L (ref 3.5–5.1)
SODIUM: 136 mmol/L (ref 135–145)

## 2014-08-12 MED ORDER — TICAGRELOR 90 MG PO TABS: 90.0000 mg | ORAL_TABLET | Freq: Two times a day (BID) | ORAL | Status: DC

## 2014-08-12 MED ORDER — ATORVASTATIN CALCIUM 80 MG PO TABS: 80.0000 mg | ORAL_TABLET | Freq: Every day | ORAL | Status: DC

## 2014-08-12 NOTE — Progress Notes (Signed)
    Subjective: No CP or SOB  Objective: Vital signs in last 24 hours: Temp:  [97.5 F (36.4 C)-98.3 F (36.8 C)] 97.7 F (36.5 C) (05/04 0344) Pulse Rate:  [63-80] 63 (05/04 0344) Resp:  [15-18] 18 (05/04 0344) BP: (98-122)/(42-62) 98/57 mmHg (05/04 0344) SpO2:  [94 %-100 %] 94 % (05/04 0344) Last BM Date: 08/10/14  Intake/Output from previous day: 05/03 0701 - 05/04 0700 In: 1320 [P.O.:1320] Out: 1850 [Urine:1850] Intake/Output this shift:    Medications Scheduled Meds: . atorvastatin  80 mg Oral q1800  . heparin  5,000 Units Subcutaneous 3 times per day  . ticagrelor  90 mg Oral BID   Continuous Infusions:  PRN Meds:.sodium chloride, acetaminophen, ALPRAZolam, morphine injection, ondansetron (ZOFRAN) IV, sodium chloride, zolpidem  PE: General appearance: alert, cooperative and no distress Lungs: clear to auscultation bilaterally Heart: regular rate and rhythm, S1, S2 normal, no murmur, click, rub or gallop Extremities: No LEE Pulses: 2+ and symmetric Skin: Warm and dry.  Cath site stable.  Neurologic: Grossly normal  Lab Results:   Recent Labs  08/09/14 1452 08/09/14 2039 08/10/14 0242  WBC 7.0 5.7 6.1  HGB 14.0 12.6* 12.3*  HCT 41.8 37.4* 36.1*  PLT 187 172 160   BMET  Recent Labs  08/10/14 0242 08/11/14 1215 08/12/14 0335  NA 139 141 136  K 3.4* 4.6 3.8  CL 104 105 102  CO2 26 26 25   GLUCOSE 101* 91 99  BUN 8 11 13   CREATININE 1.18 1.07 0.99  CALCIUM 9.5 9.7 8.9     Assessment/Plan   Principal Problem:   ST elevation myocardial infarction (STEMI) of true posterior wall Active Problems:   Hypokalemia   Tobacco abuse  Posterior MI s/p DES LCX: He is on ticagrelor.   Has ASA allergy(Years ago-throat swelled.  Never took it since). He is not on a beta blocker due to bradycardia. Atorvastatin 80mg  was started this admission. 2D ECHO:  EF 45-50%. Mild LVH, Inferior hypokinesis, G1DD. Repeat EKG, QTc-4257ms.   Ambulated 73000ft  yesterday at quick pace without difficulties.  Not interested in CRPII.    Tobacco abuse: Encouraged cessation. Patient interested in quitting. Doing well without nicotine patch.  Hypokalemia: WNL now.     Looks great.  DC home today.  [place] in [place] with Dr. Kirke CorinArida.      LOS: 3 days    HAGER, BRYAN PA-C 08/12/2014 8:37 AM  Patient examined chart reviewed Type A circumflex lesion nice result  DAT  No murmurs D/C home  Charlton HawsPeter Alexys Gassett

## 2014-08-12 NOTE — Progress Notes (Signed)
Pt discharging to discharge lounge via wheelchair, escorted by staff. Daughter to pick him up after her class. Discharge instructions provided to patient with emphasis on follow up care, cardiac diet and smoking cessation; verbalizes understanding and able to provide appropriate teach back. PIVs discontinued, sites without s/s of complication. Tele box removed and returned to unit. Denies needs or questions at this time.

## 2014-08-12 NOTE — Discharge Summary (Signed)
Physician Discharge Summary    Cardiologist:  Kirke CorinArida: new  Patient ID: Aaron BleacherGary W Knox MRN: [id number] DOB/AGE: [birth year] 59 y.o.  Admit date: 08/09/2014 Discharge date: 08/12/2014  Admission Diagnoses:  STEMI  Discharge Diagnoses:  Principal Problem:   ST elevation myocardial infarction (STEMI) of true posterior wall Active Problems:   Hypokalemia   Tobacco abuse   Discharged Condition: stable  Hospital Course:   This is a 59 year old male with no previous cardiac history. He has prolonged history of tobacco use and has been smoking one and a half pack per day since he was 59 years old. He has no chronic medical conditions and does not have a primary care physician. He does not take any medications. He started having intermittent chest pain on Wednesday which resolved without intervention. This continued since then but was not constant. However, today around 11 PM, he started having severe substernal chest tightness associated with shortness of breath with no radiation. The pain was severe and thus he went [facility]. His ECG showed evidence of posterior myocardial infarction on thus he was transferred emergently. The patient received heparin and Bilinta. He reports allergy to aspirin with previous throat swelling. By the time he arrived to the cath lab, his chest pain was down to 2 out of 10.  The left heart cath revealed prox Cx lesion, 99% stenosed. He underwent successful angioplasty and drug-eluting stent placement to the proximal left circumflex.(see cath note below).   No beta blocker due to bradycardia.  Tobacco cessation discussed.  On a statin.  2D ECHO: EF 45-50%. Mild LVH, Inferior hypokinesis, G1DD. Repeat EKG, QTc-48357ms. Ambulated with cardiac rehab 74200ft at quick pace without difficulties. Not interested in CRPII. Motivated to quit smoking.  The patient was seen by Dr. Eden EmmsNishan who felt he was stable for DC home.  Follow up with Dr. Kirke CorinArida arranged.  Will  need LFTs in 6 weeks since new to statin.   Consults: Cardiac Rehab  Significant Diagnostic Studies:    Mid LAD lesion, 30% stenosed.  Mid Cx lesion, 45% stenosed.  Prox RCA lesion, 20% stenosed.  Dist RCA lesion, 40% stenosed.  Prox Cx lesion, 99% stenosed. There is a 10% residual stenosis post intervention.  A drug-eluting stent was placed.  1. Severe one-vessel coronary artery disease. 2. Mildly to moderately reduced LV systolic function. 3. Successful angioplasty and drug-eluting stent placement to the proximal left circumflex. There was a 40-50% mid left circumflex stenosis which was left to be treated medically. 4. LV pressure was 113/12 with a left ventricular end-diastolic pressure of 22. Aortic pressure was 110/68.  Echo cardiogram- Study Conclusions  - Left ventricle: The cavity size was normal. Wall thickness was increased in a pattern of mild LVH. Systolic function was mildly reduced. The estimated ejection fraction was in the range of 45% to 50%. Inferior hypokinesis. Doppler parameters are consistent with abnormal left ventricular relaxation (grade 1 diastolic dysfunction). The E/e&' ratio is <8, suggesting normal LV filling pressure. - Aortic valve: Sclerosis without stenosis. There was no regurgitation. - Left atrium: The atrium was normal in size.  Impressions:  - LVEF 45-50%, inferior hypokinesis, diastolic dysfunction, elevated LV filling pressure, aortic sclerosis, normal LA size.   Treatments: See Above  Discharge Exam: Blood pressure 98/57, pulse 63, temperature 97.7 F (36.5 C), temperature source Oral, resp. rate 18, height 6\' 2"  (1.88 m), weight 186 lb 1.1 oz (84.4 kg), SpO2 94 %.   Dis[facility]  Medication List    TAKE these medications        atorvastatin 80 MG tablet  Commonly known as:  LIPITOR  Take 1 tablet (80 mg total) by mouth daily at 6 PM.     ticagrelor 90 MG Tabs tablet    Commonly known as:  BRILINTA  Take 1 tablet (90 mg total) by mouth 2 (two) times daily.       Follow-up Information    Follow up with Lorine BearsMuhammad Arida, MD On 08/31/2014.   Specialty:  Cardiology   Why:  11:30 AM   Contact information:   [address] [place] [address] [phone]      Greater than 30 minutes was spent completing the patient's discharge.    SignedWilburt Finlay: Zuriel Roskos, PAC 08/12/2014, 9:59 AM

## 2014-08-20 ENCOUNTER — Encounter (HOSPITAL_COMMUNITY): Payer: Self-pay | Admitting: Cardiovascular Disease

## 2014-08-31 ENCOUNTER — Encounter: Payer: Self-pay | Admitting: Cardiovascular Disease

## 2014-08-31 ENCOUNTER — Ambulatory Visit (INDEPENDENT_AMBULATORY_CARE_PROVIDER_SITE_OTHER): Payer: Self-pay | Admitting: Cardiovascular Disease

## 2014-08-31 VITALS — BP 128/78 | HR 70 | Ht 74.0 in | Wt 181.0 lb

## 2014-08-31 DIAGNOSIS — I2510 Atherosclerotic heart disease of native coronary artery without angina pectoris: Secondary | ICD-10-CM | POA: Insufficient documentation

## 2014-08-31 DIAGNOSIS — E785 Hyperlipidemia, unspecified: Secondary | ICD-10-CM

## 2014-08-31 DIAGNOSIS — Z720 Tobacco use: Secondary | ICD-10-CM

## 2014-08-31 DIAGNOSIS — Z9889 Other specified postprocedural states: Secondary | ICD-10-CM

## 2014-08-31 MED ORDER — CLOPIDOGREL BISULFATE 75 MG PO TABS: 75.0000 mg | ORAL_TABLET | Freq: Every day | ORAL | Status: DC

## 2014-08-31 NOTE — Assessment & Plan Note (Signed)
Continue high dose atorvastatin. Check fasting lipid and liver profile in 3 weeks.

## 2014-08-31 NOTE — Patient Instructions (Addendum)
Medication Instructions:  Your physician has recommended you make the following change in your medication:  1) START taking Plavix 75mg  daily after you finish Brilinta that you have at home.  2)STOP taking Brilinta after you finish what you have at home. Do not refill Brilinta.  Labwork: Your physician recommends that you return for a FASTING lipid and liver profile in THREE WEEKS.   Testing/Procedures: None  Follow-Up: Your physician recommends that you schedule a follow-up appointment in: THREE MONTHS with Dr. Kirke CorinArida.    Any Other Special Instructions Will Be Listed Below (If Applicable).

## 2014-08-31 NOTE — Progress Notes (Signed)
HPI  This is a 59 year old man who is here today for a follow-up visit after recent hospitalization for posterior ST elevation myocardial infarction. He has known history of tobacco use. He presented on May 1 with acute onset of chest pain and was found to have evidence of posterior ST elevation myocardial infarction. I proceeded with emergent cardiac catheterization via the right renal artery which showed 99% stenosis in the proximal left circumflex, 40-50% mid left circumflex stenosis and mild nonobstructive disease affecting the LAD and RCA. I performed successful angioplasty and drug-eluting stent placement to the proximal left circumflex. Ejection fraction was 40%. He did not tolerate an ACE inhibitor or a beta blocker due to hypotension. He is allergic to aspirin and thus was treated with Brilinta   monotherapy. He has been doing reasonably well and denies any recurrent chest pain. He has been taking his medications regularly. He does not have any health insurance and thus has hard time affording brand name medications. 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 He is trying to quit smoking and down from 40 cigarettes a day to 10 cigarettes a day.     Allergies  Allergen Reactions  . Aspirin Anaphylaxis  . Penicillins Anaphylaxis     Current Outpatient Prescriptions on File Prior to Visit  Medication Sig Dispense Refill  . atorvastatin (LIPITOR) 80 MG tablet Take 1 tablet (80 mg total) by mouth daily at 6 PM. 30 tablet 11   No current facility-administered medications on file prior to visit.     History reviewed. No pertinent past medical history.   Past Surgical History  Procedure Laterality Date  . Coronary angioplasty    . Cardiac catheterization N/A 08/09/2014    Procedure: Left Heart Cath and Coronary Angiography;  Surgeon: Iran Ouch, MD;  Location: [facility];  Service: Cardiovascular;  Laterality: N/A;  . Cardiac catheterization N/A 08/09/2014    Procedure: Coronary Stent Intervention;  Surgeon: Iran Ouch, MD;  Location: [facility];  Service: Cardiovascular;  Laterality: N/A;     History reviewed. No pertinent family history.   History   Social History  . Marital Status: Single    Spouse Name: N/A  . Number of Children: N/A  . Years of Education: N/A   Occupational History  . Not on file.   Social History Main Topics  . Smoking status: Current Every Day Smoker -- 1.00 packs/day    Types: Cigarettes  . Smokeless tobacco: Not on file  . Alcohol Use: 1.8 oz/week    3 Glasses of wine per week  . Drug Use: No  . Sexual Activity: Not on file   Other Topics Concern  . Not on file   Social History Narrative      PHYSICAL EXAM   BP 128/78 mmHg  Pulse 70  Ht  (1.88 m)  Wt 82.101 kg (181 lb)  BMI 23.23 kg/m2 Constitutional: He is oriented to person, place, and time. He appears well-developed and well-nourished. No distress.  HENT: No nasal discharge.  Head: Normocephalic and atraumatic.  Eyes: Pupils are equal and round.  No discharge. Neck: Normal range of motion. Neck supple. No JVD present. No thyromegaly present.  Cardiovascular: Normal rate, regular rhythm, normal heart sounds. Exam reveals no gallop and no friction rub. No murmur heard.  Pulmonary/Chest: Effort normal and breath sounds normal. No stridor. No respiratory distress. He has no wheezes. He has no rales. He exhibits  no tenderness.  Abdominal: Soft. Bowel sounds are normal. He exhibits no distension. There is no tenderness. There is no rebound and no guarding.  Musculoskeletal: Normal range of motion. He exhibits no edema and no tenderness.  Neurological: He is alert and oriented to person, place, and time. Coordination normal.  Skin: Skin is warm and dry. No rash noted. He is not diaphoretic. No erythema. No pallor.  Psychiatric: He has a normal mood and affect. His behavior is normal. Judgment and thought content normal.    Right radial pulse is normal with no hematoma.     ZOX:WRUEAEKG:Sinus  Rhythm  -  T-abnormality  -Possible inferior ischemia  -consider inferior infarct (age undetermined).   ABNORMAL     ASSESSMENT AND PLAN

## 2014-08-31 NOTE — Assessment & Plan Note (Signed)
He is trying to quit smoking and currently is down to 10 cigarettes a day. He is determined to quit and this was discussed with him.

## 2014-08-31 NOTE — Assessment & Plan Note (Signed)
He is doing reasonably well after recent posterior myocardial infarction status post drug-eluting stent placement to the left circumflex. He is truly allergic to aspirin and thus he is being treated with Brilinta. However, he is not able to afford this medication long term and thus I decided to switch him to Plavix 75 mg once daily after he finishes current supplies. He can resume work on June 1 with no restrictions. He has been doing very well with no recurrent angina. Unfortunately, he is not able to attend cardiac rehabilitation for financial reasons. He is going to exercise on his own. I will consider starting small dose carvedilol upon follow-up.

## 2014-09-10 ENCOUNTER — Telehealth: Payer: Self-pay | Admitting: *Deleted

## 2014-09-10 MED ORDER — ATORVASTATIN CALCIUM 80 MG PO TABS
80.0000 mg | ORAL_TABLET | Freq: Every day | ORAL | Status: DC
Start: 2014-09-10 — End: 2018-01-30

## 2014-09-10 MED ORDER — CLOPIDOGREL BISULFATE 75 MG PO TABS: 75.0000 mg | ORAL_TABLET | Freq: Every day | ORAL | Status: DC

## 2014-09-10 NOTE — Telephone Encounter (Signed)
Rx sent for generic plavix and lipitor 90 day supply sent to [facility].

## 2014-09-10 NOTE — Telephone Encounter (Signed)
Pt went to his pharmacy and it came out to being 500 would like us to send the medication to  [facility] [place] will be only 50 dollars  Generic please on lipitor and plavix for 90 days.

## 2014-09-22 ENCOUNTER — Other Ambulatory Visit: Payer: Self-pay

## 2014-12-03 ENCOUNTER — Ambulatory Visit: Payer: Self-pay | Admitting: Cardiovascular Disease

## 2014-12-03 ENCOUNTER — Encounter: Payer: Self-pay | Admitting: *Deleted

## 2014-12-24 ENCOUNTER — Encounter: Payer: Self-pay | Admitting: *Deleted

## 2014-12-24 ENCOUNTER — Ambulatory Visit: Payer: Self-pay | Admitting: Cardiovascular Disease

## 2018-01-28 ENCOUNTER — Encounter: Payer: Self-pay | Admitting: Emergency Medicine

## 2018-01-28 ENCOUNTER — Observation Stay
Admission: EM | Admit: 2018-01-28 | Discharge: 2018-01-30 | Disposition: A | Discharge status: Home / Self Care | Payer: Self-pay | Attending: Internal Medicine | Admitting: Internal Medicine

## 2018-01-28 ENCOUNTER — Other Ambulatory Visit: Payer: Self-pay

## 2018-01-28 ENCOUNTER — Emergency Department: Payer: Self-pay

## 2018-01-28 ENCOUNTER — Observation Stay (HOSPITAL_BASED_OUTPATIENT_CLINIC_OR_DEPARTMENT_OTHER)
Admit: 2018-01-28 | Discharge: 2018-01-28 | Disposition: A | Discharge status: Home / Self Care | Payer: Self-pay | Attending: Physician Assistant | Admitting: Physician Assistant

## 2018-01-28 DIAGNOSIS — I5020 Unspecified systolic (congestive) heart failure: Secondary | ICD-10-CM | POA: Insufficient documentation

## 2018-01-28 DIAGNOSIS — I252 Old myocardial infarction: Secondary | ICD-10-CM | POA: Insufficient documentation

## 2018-01-28 DIAGNOSIS — R072 Precordial pain: Secondary | ICD-10-CM

## 2018-01-28 DIAGNOSIS — I200 Unstable angina: Secondary | ICD-10-CM

## 2018-01-28 DIAGNOSIS — I25110 Atherosclerotic heart disease of native coronary artery with unstable angina pectoris: Principal | ICD-10-CM | POA: Insufficient documentation

## 2018-01-28 DIAGNOSIS — Z955 Presence of coronary angioplasty implant and graft: Secondary | ICD-10-CM | POA: Insufficient documentation

## 2018-01-28 DIAGNOSIS — Z8249 Family history of ischemic heart disease and other diseases of the circulatory system: Secondary | ICD-10-CM | POA: Insufficient documentation

## 2018-01-28 DIAGNOSIS — F17210 Nicotine dependence, cigarettes, uncomplicated: Secondary | ICD-10-CM | POA: Insufficient documentation

## 2018-01-28 DIAGNOSIS — Z720 Tobacco use: Secondary | ICD-10-CM

## 2018-01-28 DIAGNOSIS — E785 Hyperlipidemia, unspecified: Secondary | ICD-10-CM | POA: Insufficient documentation

## 2018-01-28 DIAGNOSIS — Z886 Allergy status to analgesic agent status: Secondary | ICD-10-CM | POA: Insufficient documentation

## 2018-01-28 DIAGNOSIS — R079 Chest pain, unspecified: Secondary | ICD-10-CM

## 2018-01-28 DIAGNOSIS — I5030 Unspecified diastolic (congestive) heart failure: Secondary | ICD-10-CM

## 2018-01-28 DIAGNOSIS — Z880 Allergy status to penicillin: Secondary | ICD-10-CM | POA: Insufficient documentation

## 2018-01-28 HISTORY — DX: Atherosclerotic heart disease of native coronary artery without angina pectoris: I25.10

## 2018-01-28 HISTORY — DX: Heart disease, unspecified: I51.9

## 2018-01-28 LAB — LIPID PANEL
CHOLESTEROL: 215 mg/dL — AB (ref 0–200)
HDL: 28 mg/dL — ABNORMAL LOW (ref >40)
LDL CALC: 135 mg/dL — AB (ref 0–99)
TRIGLYCERIDES: 260 mg/dL — AB (ref <150)
Total CHOL/HDL Ratio: 7.7 RATIO
VLDL: 52 mg/dL — AB (ref 0–40)

## 2018-01-28 LAB — BASIC METABOLIC PANEL
ANION GAP: 8 (ref 5–15)
BUN: 13 mg/dL (ref 8–23)
CHLORIDE: 105 mmol/L (ref 98–111)
CO2: 25 mmol/L (ref 22–32)
Calcium: 9.3 mg/dL (ref 8.9–10.3)
Creatinine, Ser: 0.98 mg/dL (ref 0.61–1.24)
GFR calc non Af Amer: >60 mL/min (ref >60)
Glucose, Bld: 113 mg/dL — ABNORMAL HIGH (ref 70–99)
POTASSIUM: 3.9 mmol/L (ref 3.5–5.1)
Sodium: 138 mmol/L (ref 135–145)

## 2018-01-28 LAB — CBC
HEMATOCRIT: 42.5 % (ref 39.0–52.0)
HEMOGLOBIN: 14.4 g/dL (ref 13.0–17.0)
MCH: 31.2 pg (ref 26.0–34.0)
MCHC: 33.9 g/dL (ref 30.0–36.0)
MCV: 92 fL (ref 80.0–100.0)
NRBC: 0 % (ref 0.0–0.2)
Platelets: 183 10*3/uL (ref 150–400)
RBC: 4.62 MIL/uL (ref 4.22–5.81)
RDW: 12.5 % (ref 11.5–15.5)
WBC: 5.1 10*3/uL (ref 4.0–10.5)

## 2018-01-28 LAB — TROPONIN I
Troponin I: <0.03 ng/mL (ref <0.03)
Troponin I: <0.03 ng/mL (ref <0.03)
Troponin I: <0.03 ng/mL (ref <0.03)

## 2018-01-28 LAB — ECHOCARDIOGRAM COMPLETE
Height: 74 in
WEIGHTICAEL: 2960 [oz_av]

## 2018-01-28 MED ORDER — CLOPIDOGREL BISULFATE 75 MG PO TABS
300.0000 mg | ORAL_TABLET | Freq: Once | ORAL | Status: AC
  Administered 2018-01-28: 300 mg via ORAL
  Filled 2018-01-28: qty 4

## 2018-01-28 MED ORDER — CLOPIDOGREL BISULFATE 75 MG PO TABS
75.0000 mg | ORAL_TABLET | Freq: Every day | ORAL | Status: DC
  Administered 2018-01-29 – 2018-01-30 (×2): 75 mg via ORAL
  Filled 2018-01-28 (×2): qty 1

## 2018-01-28 MED ORDER — METOPROLOL TARTRATE 25 MG PO TABS
25.0000 mg | ORAL_TABLET | Freq: Two times a day (BID) | ORAL | Status: DC
  Administered 2018-01-28 (×2): 25 mg via ORAL
  Filled 2018-01-28 (×3): qty 1

## 2018-01-28 MED ORDER — ACETAMINOPHEN 325 MG PO TABS: 650.0000 mg | ORAL_TABLET | ORAL | Status: DC | PRN

## 2018-01-28 MED ORDER — SODIUM CHLORIDE 0.9 % IV SOLN: 250.0000 mL | INTRAVENOUS | Status: DC | PRN

## 2018-01-28 MED ORDER — ENOXAPARIN SODIUM 40 MG/0.4ML ~~LOC~~ SOLN
40.0000 mg | SUBCUTANEOUS | Status: DC
  Administered 2018-01-28: 40 mg via SUBCUTANEOUS
  Filled 2018-01-28: qty 0.4

## 2018-01-28 MED ORDER — ATORVASTATIN CALCIUM 20 MG PO TABS
80.0000 mg | ORAL_TABLET | Freq: Every day | ORAL | Status: DC
  Administered 2018-01-28 – 2018-01-29 (×2): 80 mg via ORAL
  Filled 2018-01-28 (×2): qty 4

## 2018-01-28 MED ORDER — NITROGLYCERIN 0.4 MG SL SUBL: 0.4000 mg | SUBLINGUAL_TABLET | SUBLINGUAL | Status: DC | PRN

## 2018-01-28 MED ORDER — SODIUM CHLORIDE 0.9 % WEIGHT BASED INFUSION
3.0000 mL/kg/h | INTRAVENOUS | Status: DC
  Administered 2018-01-29: 3 mL/kg/h via INTRAVENOUS

## 2018-01-28 MED ORDER — SODIUM CHLORIDE 0.9 % WEIGHT BASED INFUSION
1.0000 mL/kg/h | INTRAVENOUS | Status: DC
  Administered 2018-01-29 (×2): 1 mL/kg/h via INTRAVENOUS

## 2018-01-28 MED ORDER — ONDANSETRON HCL 4 MG/2ML IJ SOLN: 4.0000 mg | Freq: Four times a day (QID) | INTRAMUSCULAR | Status: DC | PRN

## 2018-01-28 MED ORDER — SODIUM CHLORIDE 0.9% FLUSH: 3.0000 mL | INTRAVENOUS | Status: DC | PRN

## 2018-01-28 MED ORDER — SODIUM CHLORIDE 0.9% FLUSH
3.0000 mL | Freq: Two times a day (BID) | INTRAVENOUS | Status: DC
  Administered 2018-01-28 – 2018-01-29 (×2): 3 mL via INTRAVENOUS

## 2018-01-28 NOTE — ED Triage Notes (Signed)
Pt reports awoke this am and had  CP, and SOB. Pt reports pain was radiating to left side. Pt reports hx of MI 3-4 years ago.

## 2018-01-28 NOTE — H&P (View-Only) (Signed)
   Cardiology Consultation:   Patient ID: Aaron Knox; [id number]; 05/19/1955   Admit date: 01/28/2018 Date of Consult: 01/28/2018  Primary Care Provider: Patient, No Pcp Per Primary Cardiologist: Arida (lost to follow up since 2016)   Patient Profile:   Aaron W Lynch is a 62 y.o. male with a hx of CAD with posterior ST elevation MI in 08/2014 as detailed below, systolic dysfunction, medical noncompliance, and tobacco abuse abuse who is being seen today for the evaluation of chest pain at the request of Dr. Patel.  History of Present Illness:   Mr. Raptis was admitted in 08/2014 with acute onset of chest pain and was found to have evidence of posterior ST elevation myocardial infarction. He underwent emergent cardiac catheterization via the right radial artery which showed 99% stenosis in the proximal left circumflex, 40-50% mid left circumflex stenosis and mild nonobstructive disease affecting the LAD and RCA. He underwent successful angioplasty and drug-eluting stent placement to the proximal left circumflex. Ejection fraction was 40%. He did not tolerate an ACE inhibitor or a beta blocker due to hypotension. He was allergic to aspirin and thus was treated with Brilinta monotherapy. He was last seen in the office in hospital follow up in 08/2014 and was doing reasonably well. He was having a hard time affording medications. He was down from 40 cigarettes daily to 10 daily at that time. He was changed to Plavix monotherapy given ASA allergy. He has been lost to follow up since.   Patient has been feeling quite fatigued for the past 2-3 weeks noting he needs to stop and take a break after working for just 1-2 hours. In this setting, he woke up at his typical 5 AM this morning and developed severe, sudden onset SOB and chest pressure that radiated from substernal to the left lateral chest wall. There was no associated nausea, vomiting, diaphoresis, palpitations, dizziness, presyncope  or syncope. Pain was rated a 2/10 and not as severe as his MI. Pain lasted from 5 AM until he got to [facility] and was given Plavix. He continues to smoke 1 pack of cigarettes about every 2-2.5 days. He rarely drinks etoh and denies illegal drugs (prior THC in the military years ago). He reports compliance with his Plavix for ~ 12 months following his PCI in 2016 and since then has not been on any medications.   Upon the patient's arrival to [facility] they were found to have stable vitals. EKG as below, CXR showed no acute process. Labs showed troponin negative x 1, cbc unremarkable, K+ 3.9, SCr 0.98. In the ED he was given Plavix 300 mg x 1, due to ASA allergy. Upon admission, cardiology was asked to evaluate. Currently, chest pain free.   Past Medical History:  Diagnosis Date  . CAD in native artery    a. posterior STEMI 08/2014; b. LHC 5/16 - 99% stenosis in the proximal left circumflex, 40-50% mid left circumflex stenosis and mild nonobstructive disease affecting the LAD and RCA, successful PCI/DES to pLCx, EF 40%  . Systolic dysfunction    a. TTE 5/16: EF 45-50%, Gr1DD, inferior HK, aortic sclerosis w/o stenosis    Past Surgical History:  Procedure Laterality Date  . CARDIAC CATHETERIZATION N/A 08/09/2014   Procedure: Left Heart Cath and Coronary Angiography;  Surgeon: Muhammad A Arida, MD;  Location: [facility];  Service: Cardiovascular;  Laterality: N/A;  . CARDIAC CATHETERIZATION N/A 08/09/2014   Procedure: Coronary Stent Intervention;  Surgeon: Muhammad A Arida, MD;    Location: [facility];  Service: Cardiovascular;  Laterality: N/A;  . CORONARY ANGIOPLASTY       Home Meds: Prior to Admission medications   Medication Sig Start Date End Date Taking? Authorizing Provider  atorvastatin (LIPITOR) 80 MG tablet Take 1 tablet (80 mg total) by mouth daily at 6 PM. Patient not taking: Reported on 01/28/2018 09/10/14   Gollan, Timothy J, MD  clopidogrel (PLAVIX) 75 MG tablet Take 1 tablet (75  mg total) by mouth daily. Patient not taking: Reported on 01/28/2018 09/10/14   Gollan, Timothy J, MD    Inpatient Medications: Scheduled Meds: . [START ON 01/29/2018] clopidogrel  75 mg Oral Daily  . enoxaparin (LOVENOX) injection  40 mg Subcutaneous Q24H  . metoprolol tartrate  25 mg Oral BID   Continuous Infusions:  PRN Meds: acetaminophen, nitroGLYCERIN, ondansetron (ZOFRAN) IV  Allergies:   Allergies  Allergen Reactions  . Aspirin Anaphylaxis  . Penicillins Anaphylaxis    Has patient had a PCN reaction causing immediate rash, facial/tongue/throat swelling, SOB or lightheadedness with hypotension: Yes Has patient had a PCN reaction causing severe rash involving mucus membranes or skin necrosis: No Has patient had a PCN reaction that required hospitalization: Yes Has patient had a PCN reaction occurring within the last 10 years: No If all of the above answers are "NO", then may proceed with Cephalosporin use.    Social History:   Social History   Socioeconomic History  . Marital status: Single    Spouse name: Not on file  . Number of children: Not on file  . Years of education: Not on file  . Highest education level: Not on file  Occupational History  . Not on file  Social Needs  . Financial resource strain: Not on file  . Food insecurity:    Worry: Not on file    Inability: Not on file  . Transportation needs:    Medical: Not on file    Non-medical: Not on file  Tobacco Use  . Smoking status: Current Every Day Smoker    Packs/day: 1.00    Types: Cigarettes  Substance and Sexual Activity  . Alcohol use: Yes    Alcohol/week: 3.0 standard drinks    Types: 3 Glasses of wine per week  . Drug use: No  . Sexual activity: Not on file  Lifestyle  . Physical activity:    Days per week: Not on file    Minutes per session: Not on file  . Stress: Not on file  Relationships  . Social connections:    Talks on phone: Not on file    Gets together: Not on file     Attends religious service: Not on file    Active member of club or organization: Not on file    Attends meetings of clubs or organizations: Not on file    Relationship status: Not on file  . Intimate partner violence:    Fear of current or ex partner: Not on file    Emotionally abused: Not on file    Physically abused: Not on file    Forced sexual activity: Not on file  Other Topics Concern  . Not on file  Social History Narrative  . Not on file     Family History:   Family History  Problem Relation Age of Onset  . CAD Maternal Grandfather     ROS:  Review of Systems  Constitutional: Positive for malaise/fatigue. Negative for chills, diaphoresis, fever and weight loss.  HENT:   Negative for congestion.   Eyes: Negative for discharge and redness.  Respiratory: Negative for cough, hemoptysis, sputum production, shortness of breath and wheezing.   Cardiovascular: Positive for chest pain. Negative for palpitations, orthopnea, claudication, leg swelling and PND.  Gastrointestinal: Negative for abdominal pain, blood in stool, heartburn, melena, nausea and vomiting.  Genitourinary: Negative for hematuria.  Musculoskeletal: Negative for falls and myalgias.  Skin: Negative for rash.  Neurological: Positive for dizziness and weakness. Negative for tingling, tremors, sensory change, speech change, focal weakness and loss of consciousness.       Dizzy if stands too fast  Endo/Heme/Allergies: Does not bruise/bleed easily.  Psychiatric/Behavioral: Negative for substance abuse. The patient is not nervous/anxious.   All other systems reviewed and are negative.     Physical Exam/Data:   Vitals:   01/28/18 0923 01/28/18 1000 01/28/18 1100 01/28/18 1200  BP:  (!) 143/77 136/77 139/83  Pulse: 87 85 66 71  Resp: 20 12 14 13  Temp: 97.8 F (36.6 C)     TempSrc: Oral     SpO2: 100% 98% 98% 96%  Weight: 83.9 kg     Height: 6' 2" (1.88 m)      No intake or output data in the 24 hours ending  01/28/18 1216 Filed Weights   01/28/18 0923  Weight: 83.9 kg   Body mass index is 23.75 kg/m.   Physical Exam: General: Well developed, well nourished, in no acute distress. Head: Normocephalic, atraumatic, sclera non-icteric, no xanthomas, nares without discharge.  Neck: Negative for carotid bruits. JVD not elevated. Lungs: Clear bilaterally to auscultation without wheezes, rales, or rhonchi. Breathing is unlabored. Heart: RRR with S1 S2. No murmurs, rubs, or gallops appreciated. Abdomen: Soft, non-tender, non-distended with normoactive bowel sounds. No hepatomegaly. No rebound/guarding. No obvious abdominal masses. Msk:  Strength and tone appear normal for age. Extremities: No clubbing or cyanosis. No edema. Distal pedal pulses are 2+ and equal bilaterally. Neuro: Alert and oriented X 3. No facial asymmetry. No focal deficit. Moves all extremities spontaneously. Psych:  Responds to questions appropriately with a normal affect.   EKG:  The EKG was personally reviewed and demonstrates: sinus tachycardia, 101 bpm, PAC, nonspecific st/t changes Telemetry:  Telemetry was personally reviewed and demonstrates: NSR  Weights: Filed Weights   01/28/18 0923  Weight: 83.9 kg    Relevant CV Studies: LHC 08/2014: Conclusion    Mid LAD lesion, 30% stenosed.  Mid Cx lesion, 45% stenosed.  Prox RCA lesion, 20% stenosed.  Dist RCA lesion, 40% stenosed.  Prox Cx lesion, 99% stenosed. There is a 10% residual stenosis post intervention.  A drug-eluting stent was placed.   1. Severe one-vessel coronary artery disease. 2. Mildly to moderately reduced LV systolic function. 3. Successful angioplasty and drug-eluting stent placement to the proximal left circumflex. There was a 40-50% mid left circumflex stenosis which was left to be treated medically. 4. LV pressure was 113/12 with a left ventricular end-diastolic pressure of 22. Aortic pressure was 110/68.  Recommendations: The patient  is allergic to aspirin. Thus, he will be treated with Brilinta monotherapy. Aggressive treatment of risk factors and smoking cessation is recommended. I started small dose carvedilol. Start small dose ACE inhibitor if blood pressure tolerates.    __________  Echo 08/2014: Study Conclusions  - Left ventricle: The cavity size was normal. Wall thickness was   increased in a pattern of mild LVH. Systolic function was mildly   reduced. The estimated ejection fraction was in the range   of 45%   to 50%. Inferior hypokinesis. Doppler parameters are consistent   with abnormal left ventricular relaxation (grade 1 diastolic   dysfunction). The E/e&' ratio is <8, suggesting normal LV filling   pressure. - Aortic valve: Sclerosis without stenosis. There was no   regurgitation. - Left atrium: The atrium was normal in size.  Impressions:  - LVEF 45-50%, inferior hypokinesis, diastolic dysfunction,   elevated LV filling pressure, aortic sclerosis, normal LA size. __________   Laboratory Data:  Chemistry Recent Labs  Lab 01/28/18 1005  NA 138  K 3.9  CL 105  CO2 25  GLUCOSE 113*  BUN 13  CREATININE 0.98  CALCIUM 9.3  GFRNONAA >60  GFRAA >60  ANIONGAP 8    No results for input(s): PROT, ALBUMIN, AST, ALT, ALKPHOS, BILITOT in the last 168 hours. Hematology Recent Labs  Lab 01/28/18 1005  WBC 5.1  RBC 4.62  HGB 14.4  HCT 42.5  MCV 92.0  MCH 31.2  MCHC 33.9  RDW 12.5  PLT 183   Cardiac Enzymes Recent Labs  Lab 01/28/18 1005  TROPONINI <0.03   No results for input(s): TROPIPOC in the last 168 hours.  BNPNo results for input(s): BNP, PROBNP in the last 168 hours.  DDimer No results for input(s): DDIMER in the last 168 hours.  Radiology/Studies:  Dg Chest 2 View  Result Date: 01/28/2018 IMPRESSION: No acute cardiopulmonary disease. Electronically Signed   By: Hetal  Patel   On: 01/28/2018 10:10    Assessment and Plan:   1. Unstable angina/CAD: -Currently, chest  pain free -Initial troponin negative, continue to cycle until rule out -No indication for heparin gtt at this time unless there is dynamic elevation of troponin (> 1.0 generally) -Not on ASA due to allergy (anaphylaxis) -Loaded with 300 mg Plavix in the ED, continue Plavix 75 mg daily monotherapy  -Lopressor (previously did not tolerate secondary to hypotension  -Check lipid and A1c for further risk stratification  -Goal LDL < 70 -Given his history, ongoing tobacco abuse, and prior loss to follow up; it is likely best if we proceed with a LHC on 10/22 to definitively evaluate for ischemia. Options of Myoview and cath were discussed. He does agree to proceed with LHC on 10/22 with Dr. End -Check echo -NPO at midnight -Risks and benefits of cardiac catheterization have been discussed with the patient including risks of bleeding, bruising, infection, kidney damage, stroke, heart attack, and death. The patient understands these risks and is willing to proceed with the procedure. All questions have been answered and concerns listened to  2. Systolic dysfunction: -He does not appear grossly volume up at this time -Check echo -Lopressor  3. Tobacco abuse: -Complete cessation advised -Declines nicotine patch at this time   For questions or updates, please contact [facility] Please consult [email or web address] for contact info under Cardiology/STEMI.   Signed, Franck Vinal, PA-C [facility] Pager: [phone] 01/28/2018, 12:16 PM    

## 2018-01-28 NOTE — H&P (Signed)
[facility] - [place] at [facility]   PATIENT NAME: Aaron Knox    MR#:  [id number]  DATE OF BIRTH:  [birth year]  DATE OF ADMISSION:  01/28/2018  PRIMARY CARE PHYSICIAN: Patient, No Pcp Per   REQUESTING/REFERRING PHYSICIAN: Dr. Scotty Court  CHIEF COMPLAINT:  chest pain shortness of breath since morning  HISTORY OF PRESENT ILLNESS:  Aaron Knox  is a 62 y.o. male with a known history of CAD status post drug-eluting stent in proximal circumflex three years ago finished the course with Belinda and statin, ongoing tobacco abuse comes to the emergency room after he woke up feeling short winded and heaviness around his chest. Patient said it was around 7/10 and at present it is around 4/10. He doesn't take any medications. Does not have primary care and neither does he follow up with Dr. Kirke Corin due to insurance issues.  Troponin is negative. EKG shows sinus tachycardia.  Patient is being admitted for chest pain rule out MI.  PAST MEDICAL HISTORY:   Past Medical History:  Diagnosis Date  . MI (myocardial infarction) (HCC)     PAST SURGICAL HISTOIRY:   Past Surgical History:  Procedure Laterality Date  . CARDIAC CATHETERIZATION N/A 08/09/2014   Procedure: Left Heart Cath and Coronary Angiography;  Surgeon: Iran Ouch, MD;  Location: [facility] INVASIVE CV LAB;  Service: Cardiovascular;  Laterality: N/A;  . CARDIAC CATHETERIZATION N/A 08/09/2014   Procedure: Coronary Stent Intervention;  Surgeon: Iran Ouch, MD;  Location: [facility] INVASIVE CV LAB;  Service: Cardiovascular;  Laterality: N/A;  . CORONARY ANGIOPLASTY      SOCIAL HISTORY:   Social History   Tobacco Use  . Smoking status: Current Every Day Smoker    Packs/day: 1.00    Types: Cigarettes  Substance Use Topics  . Alcohol use: Yes    Alcohol/week: 3.0 standard drinks    Types: 3 Glasses of wine per week    FAMILY HISTORY:  No family history on file.  DRUG ALLERGIES:   Allergies   Allergen Reactions  . Aspirin Anaphylaxis  . Penicillins Anaphylaxis    Has patient had a PCN reaction causing immediate rash, facial/tongue/throat swelling, SOB or lightheadedness with hypotension: Yes Has patient had a PCN reaction causing severe rash involving mucus membranes or skin necrosis: No Has patient had a PCN reaction that required hospitalization: Yes Has patient had a PCN reaction occurring within the last 10 years: No If all of the above answers are "NO", then may proceed with Cephalosporin use.    REVIEW OF SYSTEMS:  Review of Systems  Constitutional: Negative for chills, fever and weight loss.  HENT: Negative for ear discharge, ear pain and nosebleeds.   Eyes: Negative for blurred vision, pain and discharge.  Respiratory: Positive for shortness of breath. Negative for sputum production, wheezing and stridor.   Cardiovascular: Positive for chest pain. Negative for palpitations, orthopnea and PND.  Gastrointestinal: Negative for abdominal pain, diarrhea, nausea and vomiting.  Genitourinary: Negative for frequency and urgency.  Musculoskeletal: Negative for back pain and joint pain.  Neurological: Negative for sensory change, speech change, focal weakness and weakness.  Psychiatric/Behavioral: Negative for depression and hallucinations. The patient is not nervous/anxious.      MEDICATIONS AT HOME:   Prior to Admission medications   Medication Sig Start Date End Date Taking? Authorizing Provider  atorvastatin (LIPITOR) 80 MG tablet Take 1 tablet (80 mg total) by mouth daily at 6 PM. Patient not taking: Reported on 01/28/2018 09/10/14  Antonieta Iba, MD  clopidogrel (PLAVIX) 75 MG tablet Take 1 tablet (75 mg total) by mouth daily. Patient not taking: Reported on 01/28/2018 09/10/14   Antonieta Iba, MD      VITAL SIGNS:  Blood pressure (!) 143/77, pulse 85, temperature 97.8 F (36.6 C), temperature source Oral, resp. rate 12, height 6\' 2"  (1.88 m), weight 83.9  kg, SpO2 98 %.  PHYSICAL EXAMINATION:  GENERAL:  62 y.o.-year-old patient lying in the bed with no acute distress.  EYES: Pupils equal, round, reactive to light and accommodation. No scleral icterus. Extraocular muscles intact.  HEENT: Head atraumatic, normocephalic. Oropharynx and nasopharynx clear.  NECK:  Supple, no jugular venous distention. No thyroid enlargement, no tenderness.  LUNGS: Normal breath sounds bilaterally, no wheezing, rales,rhonchi or crepitation. No use of accessory muscles of respiration.  CARDIOVASCULAR: S1, S2 normal. No murmurs, rubs, or gallops.  ABDOMEN: Soft, nontender, nondistended. Bowel sounds present. No organomegaly or mass.  EXTREMITIES: No pedal edema, cyanosis, or clubbing.  NEUROLOGIC: Cranial nerves II through XII are intact. Muscle strength 5/5 in all extremities. Sensation intact. Gait not checked.  PSYCHIATRIC: The patient is alert and oriented x 3.  SKIN: No obvious rash, lesion, or ulcer.   LABORATORY PANEL:   CBC Recent Labs  Lab 01/28/18 1005  WBC 5.1  HGB 14.4  HCT 42.5  PLT 183   ------------------------------------------------------------------------------------------------------------------  Chemistries  Recent Labs  Lab 01/28/18 1005  NA 138  K 3.9  CL 105  CO2 25  GLUCOSE 113*  BUN 13  CREATININE 0.98  CALCIUM 9.3   ------------------------------------------------------------------------------------------------------------------  Cardiac Enzymes Recent Labs  Lab 01/28/18 1005  TROPONINI <0.03   ------------------------------------------------------------------------------------------------------------------  RADIOLOGY:  Dg Chest 2 View  Result Date: 01/28/2018 CLINICAL DATA:  Chest pain and shortness of breath EXAM: CHEST - 2 VIEW COMPARISON:  None. FINDINGS: There is a nodular opacity over the left mid lung most consistent with a nipple shadow on the lateral view. There is no focal consolidation. The lungs are  hyperinflated likely secondary to COPD. There is no pleural effusion or pneumothorax. The heart and mediastinal contours are unremarkable. There is no acute osseous abnormality. There is mild wedge compression deformity of a midthoracic vertebral body likely chronic. IMPRESSION: No acute cardiopulmonary disease. Electronically Signed   By: Elige Ko   On: 01/28/2018 10:10    EKG:   Sinus tachycardia IMPRESSION AND PLAN:   Aaron Knox  is a 62 y.o. male with a known history of CAD status post drug-eluting stent in proximal circumflex three years ago finished the course with Belinda and statin, ongoing tobacco abuse comes to the emergency room after he woke up feeling short winded and heaviness around his chest. Patient said it was around 7/10 and at present it is around 4/10  1. chest pain rule out MI -patient has history of CAD with stent placement in proximal circumflex three years ago -currently does not take any medication. Continues with tobacco use. -Admit to overnight observation -start Plavix and beta-blockers -cycle cardiac enzymes times three -Dr. Kirke Corin to see patient. Message sent  2. tobacco abuse smoking cessation discussed  3. Hypertension start beta-blockers  4. DVT prophylaxis subcu Lovenox   All the records are reviewed and case discussed with ED provider. Management plans discussed with the patient, family and they are in agreement.  CODE STATUS: full  TOTAL TIME TAKING CARE OF THIS PATIENT: *50* minutes.    Enedina Finner M.D on 01/28/2018 at 11:26 AM  Between 7am to 6pm - Pager - [phone]  After 6pm go to [email or web address] - password [email or web address] [facility]  [facility]  Office  [phone]  CC: Primary care physician; Patient, No Pcp Per

## 2018-01-28 NOTE — Consult Note (Signed)
Cardiology Consultation:   Patient ID: Aaron Knox [facility]; [id number]; [birth year]   Admit date: 01/28/2018 Date of Consult: 01/28/2018  Primary Care Provider: Patient, No Pcp Per Primary Cardiologist: Kirke Corin (lost to follow up since 2016)   Patient Profile:   Aaron Knox is a 62 y.o. male with a hx of CAD with posterior ST elevation MI in 08/2014 as detailed below, systolic dysfunction, medical noncompliance, and tobacco abuse abuse who is being seen today for the evaluation of chest pain at the request of Dr. Allena Katz.  History of Present Illness:   [name] was admitted in 08/2014 with acute onset of chest pain and was found to have evidence of posterior ST elevation myocardial infarction. He underwent emergent cardiac catheterization via the right radial artery which showed 99% stenosis in the proximal left circumflex, 40-50% mid left circumflex stenosis and mild nonobstructive disease affecting the LAD and RCA. He underwent successful angioplasty and drug-eluting stent placement to the proximal left circumflex. Ejection fraction was 40%. He did not tolerate an ACE inhibitor or a beta blocker due to hypotension. He was allergic to aspirin and thus was treated with Brilinta monotherapy. He was last seen in the office in hospital follow up in 08/2014 and was doing reasonably well. He was having a hard time affording medications. He was down from 40 cigarettes daily to 10 daily at that time. He was changed to Plavix monotherapy given ASA allergy. He has been lost to follow up since.   Patient has been feeling quite fatigued for the past 2-3 weeks noting he needs to stop and take a break after working for just 1-2 hours. In this setting, he woke up at his typical 5 AM this morning and developed severe, sudden onset SOB and chest pressure that radiated from substernal to the left lateral chest wall. There was no associated nausea, vomiting, diaphoresis, palpitations, dizziness, presyncope  or syncope. Pain was rated a 2/10 and not as severe as his MI. Pain lasted from 5 AM until he got to [facility] and was given Plavix. He continues to smoke 1 pack of cigarettes about every 2-2.5 days. He rarely drinks etoh and denies illegal drugs (prior THC in the [employer] years ago). He reports compliance with his Plavix for ~ 12 months following his PCI in 2016 and since then has not been on any medications.   Upon the patient's arrival to [facility] they were found to have stable vitals. EKG as below, CXR showed no acute process. Labs showed troponin negative x 1, cbc unremarkable, K+ 3.9, SCr 0.98. In the ED he was given Plavix 300 mg x 1, due to ASA allergy. Upon admission, cardiology was asked to evaluate. Currently, chest pain free.   Past Medical History:  Diagnosis Date  . CAD in native artery    a. posterior STEMI 08/2014; b. LHC 5/16 - 99% stenosis in the proximal left circumflex, 40-50% mid left circumflex stenosis and mild nonobstructive disease affecting the LAD and RCA, successful PCI/DES to pLCx, EF 40%  . Systolic dysfunction    a. TTE 5/16: EF 45-50%, Gr1DD, inferior HK, aortic sclerosis w/o stenosis    Past Surgical History:  Procedure Laterality Date  . CARDIAC CATHETERIZATION N/A 08/09/2014   Procedure: Left Heart Cath and Coronary Angiography;  Surgeon: Iran Ouch, MD;  Location: [facility] INVASIVE CV LAB;  Service: Cardiovascular;  Laterality: N/A;  . CARDIAC CATHETERIZATION N/A 08/09/2014   Procedure: Coronary Stent Intervention;  Surgeon: Iran Ouch, MD;  Location: [facility] INVASIVE CV LAB;  Service: Cardiovascular;  Laterality: N/A;  . CORONARY ANGIOPLASTY       Home Meds: Prior to Admission medications   Medication Sig Start Date End Date Taking? Authorizing Provider  atorvastatin (LIPITOR) 80 MG tablet Take 1 tablet (80 mg total) by mouth daily at 6 PM. Patient not taking: Reported on 01/28/2018 09/10/14   Antonieta Iba, MD  clopidogrel (PLAVIX) 75 MG tablet Take 1 tablet (75  mg total) by mouth daily. Patient not taking: Reported on 01/28/2018 09/10/14   Antonieta Iba, MD    Inpatient Medications: Scheduled Meds: . [START ON 01/29/2018] clopidogrel  75 mg Oral Daily  . enoxaparin (LOVENOX) injection  40 mg Subcutaneous Q24H  . metoprolol tartrate  25 mg Oral BID   Continuous Infusions:  PRN Meds: acetaminophen, nitroGLYCERIN, ondansetron (ZOFRAN) IV  Allergies:   Allergies  Allergen Reactions  . Aspirin Anaphylaxis  . Penicillins Anaphylaxis    Has patient had a PCN reaction causing immediate rash, facial/tongue/throat swelling, SOB or lightheadedness with hypotension: Yes Has patient had a PCN reaction causing severe rash involving mucus membranes or skin necrosis: No Has patient had a PCN reaction that required hospitalization: Yes Has patient had a PCN reaction occurring within the last 10 years: No If all of the above answers are "NO", then may proceed with Cephalosporin use.    Social History:   Social History   Socioeconomic History  . Marital status: Single    Spouse name: Not on file  . Number of children: Not on file  . Years of education: Not on file  . Highest education level: Not on file  Occupational History  . Not on file  Social Needs  . Financial resource strain: Not on file  . Food insecurity:    Worry: Not on file    Inability: Not on file  . Transportation needs:    Medical: Not on file    Non-medical: Not on file  Tobacco Use  . Smoking status: Current Every Day Smoker    Packs/day: 1.00    Types: Cigarettes  Substance and Sexual Activity  . Alcohol use: Yes    Alcohol/week: 3.0 standard drinks    Types: 3 Glasses of wine per week  . Drug use: No  . Sexual activity: Not on file  Lifestyle  . Physical activity:    Days per week: Not on file    Minutes per session: Not on file  . Stress: Not on file  Relationships  . Social connections:    Talks on phone: Not on file    Gets together: Not on file     Attends religious service: Not on file    Active member of club or organization: Not on file    Attends meetings of clubs or organizations: Not on file    Relationship status: Not on file  . Intimate partner violence:    Fear of current or ex partner: Not on file    Emotionally abused: Not on file    Physically abused: Not on file    Forced sexual activity: Not on file  Other Topics Concern  . Not on file  Social History Narrative  . Not on file     Family History:   Family History  Problem Relation Age of Onset  . CAD Maternal Grandfather     ROS:  Review of Systems  Constitutional: Positive for malaise/fatigue. Negative for chills, diaphoresis, fever and weight loss.  HENT:  Negative for congestion.   Eyes: Negative for discharge and redness.  Respiratory: Negative for cough, hemoptysis, sputum production, shortness of breath and wheezing.   Cardiovascular: Positive for chest pain. Negative for palpitations, orthopnea, claudication, leg swelling and PND.  Gastrointestinal: Negative for abdominal pain, blood in stool, heartburn, melena, nausea and vomiting.  Genitourinary: Negative for hematuria.  Musculoskeletal: Negative for falls and myalgias.  Skin: Negative for rash.  Neurological: Positive for dizziness and weakness. Negative for tingling, tremors, sensory change, speech change, focal weakness and loss of consciousness.       Dizzy if stands too fast  Endo/Heme/Allergies: Does not bruise/bleed easily.  Psychiatric/Behavioral: Negative for substance abuse. The patient is not nervous/anxious.   All other systems reviewed and are negative.     Physical Exam/Data:   Vitals:   01/28/18 0923 01/28/18 1000 01/28/18 1100 01/28/18 1200  BP:  (!) 143/77 136/77 139/83  Pulse: 87 85 66 71  Resp: 20 12 14 13   Temp: 97.8 F (36.6 C)     TempSrc: Oral     SpO2: 100% 98% 98% 96%  Weight: 83.9 kg     Height: 6\' 2"  (1.88 m)      No intake or output data in the 24 hours ending  01/28/18 1216 Filed Weights   01/28/18 0923  Weight: 83.9 kg   Body mass index is 23.75 kg/m.   Physical Exam: General: Well developed, well nourished, in no acute distress. Head: Normocephalic, atraumatic, sclera non-icteric, no xanthomas, nares without discharge.  Neck: Negative for carotid bruits. JVD not elevated. Lungs: Clear bilaterally to auscultation without wheezes, rales, or rhonchi. Breathing is unlabored. Heart: RRR with S1 S2. No murmurs, rubs, or gallops appreciated. Abdomen: Soft, non-tender, non-distended with normoactive bowel sounds. No hepatomegaly. No rebound/guarding. No obvious abdominal masses. Msk:  Strength and tone appear normal for age. Extremities: No clubbing or cyanosis. No edema. Distal pedal pulses are 2+ and equal bilaterally. Neuro: Alert and oriented X 3. No facial asymmetry. No focal deficit. Moves all extremities spontaneously. Psych:  Responds to questions appropriately with a normal affect.   EKG:  The EKG was personally reviewed and demonstrates: sinus tachycardia, 101 bpm, PAC, nonspecific st/t changes Telemetry:  Telemetry was personally reviewed and demonstrates: NSR  Weights: [employer]   01/28/18 0923  Weight: 83.9 kg    Relevant CV Studies: LHC 08/2014: Conclusion    Mid LAD lesion, 30% stenosed.  Mid Cx lesion, 45% stenosed.  Prox RCA lesion, 20% stenosed.  Dist RCA lesion, 40% stenosed.  Prox Cx lesion, 99% stenosed. There is a 10% residual stenosis post intervention.  A drug-eluting stent was placed.   1. Severe one-vessel coronary artery disease. 2. Mildly to moderately reduced LV systolic function. 3. Successful angioplasty and drug-eluting stent placement to the proximal left circumflex. There was a 40-50% mid left circumflex stenosis which was left to be treated medically. 4. LV pressure was 113/12 with a left ventricular end-diastolic pressure of 22. Aortic pressure was 110/68.  Recommendations: The patient  is allergic to aspirin. Thus, he will be treated with Brilinta monotherapy. Aggressive treatment of risk factors and smoking cessation is recommended. I started small dose carvedilol. Start small dose ACE inhibitor if blood pressure tolerates.    __________  Echo 08/2014: Study Conclusions  - Left ventricle: The cavity size was normal. Wall thickness was   increased in a pattern of mild LVH. Systolic function was mildly   reduced. The estimated ejection fraction was in the range  of 45%   to 50%. Inferior hypokinesis. Doppler parameters are consistent   with abnormal left ventricular relaxation (grade 1 diastolic   dysfunction). The E/e&' ratio is <8, suggesting normal LV filling   pressure. - Aortic valve: Sclerosis without stenosis. There was no   regurgitation. - Left atrium: The atrium was normal in size.  Impressions:  - LVEF 45-50%, inferior hypokinesis, diastolic dysfunction,   elevated LV filling pressure, aortic sclerosis, normal LA size. __________   Laboratory Data:  Chemistry Recent Labs  Lab 01/28/18 1005  NA 138  K 3.9  CL 105  CO2 25  GLUCOSE 113*  BUN 13  CREATININE 0.98  CALCIUM 9.3  GFRNONAA >60  GFRAA >60  ANIONGAP 8    No results for input(s): PROT, ALBUMIN, AST, ALT, ALKPHOS, BILITOT in the last 168 hours. Hematology Recent Labs  Lab 01/28/18 1005  WBC 5.1  RBC 4.62  HGB 14.4  HCT 42.5  MCV 92.0  MCH 31.2  MCHC 33.9  RDW 12.5  PLT 183   Cardiac Enzymes Recent Labs  Lab 01/28/18 1005  TROPONINI <0.03   No results for input(s): TROPIPOC in the last 168 hours.  BNPNo results for input(s): BNP, PROBNP in the last 168 hours.  DDimer No results for input(s): DDIMER in the last 168 hours.  Radiology/Studies:  Dg Chest 2 View  Result Date: 01/28/2018 IMPRESSION: No acute cardiopulmonary disease. Electronically Signed   By: Elige Ko   On: 01/28/2018 10:10    Assessment and Plan:   1. Unstable angina/CAD: -Currently, chest  pain free -Initial troponin negative, continue to cycle until rule out -No indication for heparin gtt at this time unless there is dynamic elevation of troponin (> 1.0 generally) -Not on ASA due to allergy (anaphylaxis) -Loaded with 300 mg Plavix in the ED, continue Plavix 75 mg daily monotherapy  -Lopressor (previously did not tolerate secondary to hypotension  -Check lipid and A1c for further risk stratification  -Goal LDL < 70 -Given his history, ongoing tobacco abuse, and prior loss to follow up; it is likely best if we proceed with a LHC on 10/22 to definitively evaluate for ischemia. Options of Myoview and cath were discussed. He does agree to proceed with LHC on 10/22 with Dr. Okey Dupre -Check echo -NPO at midnight -Risks and benefits of cardiac catheterization have been discussed with the patient including risks of bleeding, bruising, infection, kidney damage, stroke, heart attack, and death. The patient understands these risks and is willing to proceed with the procedure. All questions have been answered and concerns listened to  2. Systolic dysfunction: -He does not appear grossly volume up at this time -Check echo -Lopressor  3. Tobacco abuse: -Complete cessation advised -Declines nicotine patch at this time   For questions or updates, please contact [facility] Please consult [email or web address] for contact info under Cardiology/STEMI.   Signed, Eula Listen, PA-C [facility] Pager: [phone] 01/28/2018, 12:16 PM

## 2018-01-28 NOTE — Progress Notes (Signed)
Family Meeting Note  Patient has history of CAD with stent placement about three years ago. Ongoing tobacco abuse. Comes in with chest pain shortness of breath. Troponin force had negative EKG shows tachycardia. Patient is being admitted for chest pain overnight observation. Discuss code status patient wishes to be full code. No family in the ER.  Spent 16 minutes.   Enedina Finner, MD

## 2018-01-28 NOTE — Progress Notes (Signed)
*  PRELIMINARY RESULTS* Echocardiogram 2D Echocardiogram has been performed.  Cristela Blue 01/28/2018, 3:08 PM

## 2018-01-28 NOTE — ED Provider Notes (Signed)
[facility] Emergency Department Provider Note  ____________________________________________  Time seen: Approximately 10:03 AM  I have reviewed the triage vital signs and the nursing notes.   HISTORY  Chief Complaint Chest Pain and Shortness of Breath    HPI Aaron Knox is a 62 y.o. male with a history of hyperlipidemia, smoking, and posterior wall STEMI status post stent who complains of central chest pain described as tightness, radiating toward the left, 6/10 intensity, associated with shortness of breath.   No vomiting diaphoresis dizziness or syncope.  No aggravating or alleviating factors.  After his STEMI, he was on Brilinta for 1 year after which he discontinued.  He has not followed up with cardiology since.  Denies any recent exertional symptoms.     Past Medical History:  Diagnosis Date  . MI (myocardial infarction) [facility])      Patient Active Problem List   Diagnosis Date Noted  . Coronary artery disease involving native coronary artery without angina pectoris 08/31/2014  . Hyperlipidemia 08/31/2014  . Hypokalemia 08/11/2014  . Tobacco abuse 08/11/2014  . ST elevation myocardial infarction (STEMI) of true posterior wall (HCC) 08/09/2014     Past Surgical History:  Procedure Laterality Date  . CARDIAC CATHETERIZATION N/A 08/09/2014   Procedure: Left Heart Cath and Coronary Angiography;  Surgeon: Iran Ouch, MD;  Location: [facility] INVASIVE CV LAB;  Service: Cardiovascular;  Laterality: N/A;  . CARDIAC CATHETERIZATION N/A 08/09/2014   Procedure: Coronary Stent Intervention;  Surgeon: Iran Ouch, MD;  Location: [facility] INVASIVE CV LAB;  Service: Cardiovascular;  Laterality: N/A;  . CORONARY ANGIOPLASTY       Prior to Admission medications   Medication Sig Start Date End Date Taking? Authorizing Provider  atorvastatin (LIPITOR) 80 MG tablet Take 1 tablet (80 mg total) by mouth daily at 6 PM. 09/10/14   Gollan, Tollie Pizza, MD   clopidogrel (PLAVIX) 75 MG tablet Take 1 tablet (75 mg total) by mouth daily. 09/10/14   Antonieta Iba, MD     Allergies Aspirin and Penicillins   No family history on file.  Social History Social History   Tobacco Use  . Smoking status: Current Every Day Smoker    Packs/day: 1.00    Types: Cigarettes  Substance Use Topics  . Alcohol use: Yes    Alcohol/week: 3.0 standard drinks    Types: 3 Glasses of wine per week  . Drug use: No    Review of Systems  Constitutional:   No fever or chills.  ENT:   No sore throat. No rhinorrhea. Cardiovascular:   Positive as above chest pain without syncope. Respiratory: Positive shortness of breath without cough. Gastrointestinal:   Negative for abdominal pain, vomiting and diarrhea.  Musculoskeletal:   Negative for focal pain or swelling All other systems reviewed and are negative except as documented above in ROS and HPI.  ____________________________________________   PHYSICAL EXAM:  VITAL SIGNS: ED Triage Vitals [01/28/18 0923]  Enc Vitals Group     BP      Pulse Rate 87     Resp 20     Temp 97.8 F (36.6 C)     Temp Source Oral     SpO2 100 %     Weight 185 lb (83.9 kg)     Height 6\' 2"  (1.88 m)     Head Circumference      Peak Flow      Pain Score 8     Pain Loc  Pain Edu?      Excl. in GC?     Vital signs reviewed, nursing assessments reviewed.   Constitutional:   Alert and oriented. Non-toxic appearance. Eyes:   Conjunctivae are normal. EOMI. PERRL. ENT      Head:   Normocephalic and atraumatic.      Nose:   No congestion/rhinnorhea.       Mouth/Throat:   Dry mucous membranes, no pharyngeal erythema. No peritonsillar mass.       Neck:   No meningismus. Full ROM. Hematological/Lymphatic/Immunilogical:   No cervical lymphadenopathy. Cardiovascular:   RRR. Symmetric bilateral radial and DP pulses.  No murmurs. Cap refill less than 2 seconds. Respiratory:   Normal respiratory effort without  tachypnea/retractions. Breath sounds are clear and equal bilaterally. No wheezes/rales/rhonchi.  No inducible wheezing or cough with FEV1 maneuver. Gastrointestinal:   Soft and nontender. Non distended. There is no CVA tenderness.  No rebound, rigidity, or guarding. Musculoskeletal:   Normal range of motion in all extremities. No joint effusions.  No lower extremity tenderness.  No edema. Neurologic:   Normal speech and language.  Motor grossly intact. No acute focal neurologic deficits are appreciated.  Skin:    Skin is warm, dry and intact. No rash noted.  No petechiae, purpura, or bullae.  ____________________________________________    LABS (pertinent positives/negatives) (all labs ordered are listed, but only abnormal results are displayed) Labs Reviewed  BASIC METABOLIC PANEL  CBC  TROPONIN I   ____________________________________________   EKG  Interpreted by me Sinus tachycardia rate 101, normal axis and intervals.  Inferior Q waves indicative of prior MI.  Slight anterior ST depression, normal T waves.  Not meeting diagnostic criteria.  ____________________________________________    RADIOLOGY  No results found.  ____________________________________________   PROCEDURES Procedures  ____________________________________________   CLINICAL IMPRESSION / ASSESSMENT AND PLAN / ED COURSE  Pertinent labs & imaging results that were available during my care of the patient were reviewed by me and considered in my medical decision making (see chart for details).      Clinical Course as of Jan 28 1058  Mon Jan 28, 2018  1610 Patient presents with concerning precordial pain.  Syndrome is not consistent with unstable angina.  Check labs, nitroglycerin sublingual for pain relief.  Oral Plavix since the patient is allergic to aspirin.  Patient is an elevated risk and will need hospitalization for further telemetry monitoring and cardiac work-up.   [PS]  1058 Troponin  negative.  Discussed with hospitalist   [PS]    Clinical Course User Index [PS] Sharman Cheek, MD     ____________________________________________   FINAL CLINICAL IMPRESSION(S) / ED DIAGNOSES    Final diagnoses:  Precordial pain  Chest pain with moderate risk for cardiac etiology     ED Discharge Orders    None      Portions of this note were generated with dragon dictation software. Dictation errors may occur despite best attempts at proofreading.    Sharman Cheek, MD 01/28/18 1100

## 2018-01-29 ENCOUNTER — Encounter
Admission: EM | Disposition: A | Discharge status: Home / Self Care | Payer: Self-pay | Source: Home / Self Care | Attending: Emergency Medicine

## 2018-01-29 DIAGNOSIS — I200 Unstable angina: Secondary | ICD-10-CM

## 2018-01-29 DIAGNOSIS — I25110 Atherosclerotic heart disease of native coronary artery with unstable angina pectoris: Principal | ICD-10-CM

## 2018-01-29 HISTORY — PX: LEFT HEART CATH AND CORONARY ANGIOGRAPHY: CATH118249

## 2018-01-29 LAB — HEMOGLOBIN A1C
HEMOGLOBIN A1C: 5.5 % (ref 4.8–5.6)
MEAN PLASMA GLUCOSE: 111 mg/dL

## 2018-01-29 SURGERY — LEFT HEART CATH AND CORONARY ANGIOGRAPHY
Anesthesia: Moderate Sedation

## 2018-01-29 MED ORDER — ENOXAPARIN SODIUM 40 MG/0.4ML ~~LOC~~ SOLN
40.0000 mg | SUBCUTANEOUS | Status: DC
  Filled 2018-01-29: qty 0.4

## 2018-01-29 MED ORDER — HEPARIN (PORCINE) IN NACL 1000-0.9 UT/500ML-% IV SOLN
INTRAVENOUS | Status: AC
  Filled 2018-01-29: qty 1000

## 2018-01-29 MED ORDER — FENTANYL CITRATE (PF) 100 MCG/2ML IJ SOLN
INTRAMUSCULAR | Status: DC | PRN
  Administered 2018-01-29: 50 ug via INTRAVENOUS

## 2018-01-29 MED ORDER — SODIUM CHLORIDE 0.9 % IV SOLN
INTRAVENOUS | Status: AC
  Administered 2018-01-29: 18:00:00 via INTRAVENOUS

## 2018-01-29 MED ORDER — HEPARIN SODIUM (PORCINE) 1000 UNIT/ML IJ SOLN
INTRAMUSCULAR | Status: DC | PRN
  Administered 2018-01-29: 4000 [IU] via INTRAVENOUS

## 2018-01-29 MED ORDER — SODIUM CHLORIDE 0.9% FLUSH: 3.0000 mL | INTRAVENOUS | Status: DC | PRN

## 2018-01-29 MED ORDER — MIDAZOLAM HCL 2 MG/2ML IJ SOLN
INTRAMUSCULAR | Status: AC
  Filled 2018-01-29: qty 2

## 2018-01-29 MED ORDER — ISOSORBIDE MONONITRATE ER 30 MG PO TB24
30.0000 mg | ORAL_TABLET | Freq: Every day | ORAL | Status: DC
  Administered 2018-01-29: 30 mg via ORAL
  Filled 2018-01-29 (×2): qty 1

## 2018-01-29 MED ORDER — MIDAZOLAM HCL 2 MG/2ML IJ SOLN
INTRAMUSCULAR | Status: DC | PRN
  Administered 2018-01-29: 1 mg via INTRAVENOUS

## 2018-01-29 MED ORDER — HEPARIN SODIUM (PORCINE) 1000 UNIT/ML IJ SOLN
INTRAMUSCULAR | Status: AC
  Filled 2018-01-29: qty 1

## 2018-01-29 MED ORDER — IOPAMIDOL (ISOVUE-300) INJECTION 61%
INTRAVENOUS | Status: DC | PRN
  Administered 2018-01-29: 35 mL via INTRA_ARTERIAL

## 2018-01-29 MED ORDER — SODIUM CHLORIDE 0.9% FLUSH
3.0000 mL | Freq: Two times a day (BID) | INTRAVENOUS | Status: DC
  Administered 2018-01-29: 3 mL via INTRAVENOUS

## 2018-01-29 MED ORDER — FENTANYL CITRATE (PF) 100 MCG/2ML IJ SOLN
INTRAMUSCULAR | Status: AC
  Filled 2018-01-29: qty 2

## 2018-01-29 MED ORDER — SODIUM CHLORIDE 0.9 % IV SOLN: 250.0000 mL | INTRAVENOUS | Status: DC | PRN

## 2018-01-29 MED ORDER — VERAPAMIL HCL 2.5 MG/ML IV SOLN
INTRAVENOUS | Status: DC | PRN
  Administered 2018-01-29: 2.5 mg via INTRA_ARTERIAL

## 2018-01-29 MED ORDER — VERAPAMIL HCL 2.5 MG/ML IV SOLN
INTRAVENOUS | Status: AC
  Filled 2018-01-29: qty 2

## 2018-01-29 SURGICAL SUPPLY — 7 items
CATH INFINITI 5FR ANG PIGTAIL (CATHETERS) ×3 IMPLANT
CATH INFINITI 5FR TG (CATHETERS) ×3 IMPLANT
DEVICE RAD TR BAND REGULAR (VASCULAR PRODUCTS) ×3 IMPLANT
GLIDESHEATH SLEND SS 6F .021 (SHEATH) ×3 IMPLANT
KIT MANI 3VAL PERCEP (MISCELLANEOUS) ×3 IMPLANT
PACK CARDIAC CATH (CUSTOM PROCEDURE TRAY) ×3 IMPLANT
WIRE ROSEN-J .035X260CM (WIRE) ×3 IMPLANT

## 2018-01-29 NOTE — Plan of Care (Signed)

## 2018-01-29 NOTE — Care Management Note (Signed)
Case Management Note  Patient Details  Name: Aaron Knox MRN: [id number] Date of Birth: [birth year]  Subjective/Objective:    Independent from home.  History of CAD, stopped taking medications as he could not afford them.  Plan for cardiac cath today.   No PCP or insurance.  Currently on room air; no services in the home.  Receives [employer] and works 3-4 hrs per day.  Patient is agreeable to establish with medication management and [facility].  In basket sent to [employer] with [facility].  Will continue to follow patient and monitor medications prescribed for discharge planning.  Denies difficulty with transportation.                Action/Plan:   Expected Discharge Date:                  Expected Discharge Plan:  Home/Self Care  In-House Referral:     Discharge planning Services  CM Consult, Medication Assistance  Post Acute Care Choice:    Choice offered to:     DME Arranged:    DME Agency:     HH Arranged:    HH Agency:     Status of Service:  In process, will continue to follow  If discussed at Long Length of Stay Meetings, dates discussed:    Additional Comments:  Sherren Kerns, RN 01/29/2018, 8:56 AM

## 2018-01-29 NOTE — Interval H&P Note (Signed)
History and Physical Interval Note:  01/29/2018 2:54 PM  Aaron Knox  has presented today for cardiac catheterization, with the diagnosis of unstable angina. The various methods of treatment have been discussed with the patient and family. After consideration of risks, benefits and other options for treatment, the patient has consented to  Procedure(s): LEFT HEART CATH AND CORONARY ANGIOGRAPHY (N/A) as a surgical intervention .  The patient's history has been reviewed, patient examined, no change in status, stable for surgery.  I have reviewed the patient's chart and labs.  Questions were answered to the patient's satisfaction.    Cath Lab Visit (complete for each Cath Lab visit)  Clinical Evaluation Leading to the Procedure:   ACS: Yes.    Non-ACS:  N/A  Ernestine Langworthy

## 2018-01-29 NOTE — Plan of Care (Signed)
  Problem: Pain Managment: Goal: General experience of comfort will improve Outcome: Progressing   Problem: Activity: Goal: Ability to return to baseline activity level will improve Outcome: Progressing

## 2018-01-29 NOTE — Progress Notes (Signed)
[employer] - [place] [facility]   PATIENT NAME: Aaron Knox    MR#:  [id number]  DATE OF BIRTH:  07-May-1955  SUBJECTIVE:  CHIEF COMPLAINT:   Chief Complaint  Patient presents with  . Chest Pain  . Shortness of Breath  Patient seen and evaluated today this morning No new complaints of chest pain No shortness of breath  REVIEW OF SYSTEMS:    ROS  CONSTITUTIONAL: No documented fever. No fatigue, weakness. No weight gain, no weight loss.  EYES: No blurry or double vision.  ENT: No tinnitus. No postnasal drip. No redness of the oropharynx.  RESPIRATORY: No cough, no wheeze, no hemoptysis. No dyspnea.  CARDIOVASCULAR: No chest pain. No orthopnea. No palpitations. No syncope.  GASTROINTESTINAL: No nausea, no vomiting or diarrhea. No abdominal pain. No melena or hematochezia.  GENITOURINARY: No dysuria or hematuria.  ENDOCRINE: No polyuria or nocturia. No heat or cold intolerance.  HEMATOLOGY: No anemia. No bruising. No bleeding.  INTEGUMENTARY: No rashes. No lesions.  MUSCULOSKELETAL: No arthritis. No swelling. No gout.  NEUROLOGIC: No numbness, tingling, or ataxia. No seizure-type activity.  PSYCHIATRIC: No anxiety. No insomnia. No ADD.   DRUG ALLERGIES:   Allergies  Allergen Reactions  . Aspirin Anaphylaxis  . Penicillins Anaphylaxis    Has patient had a PCN reaction causing immediate rash, facial/tongue/throat swelling, SOB or lightheadedness with hypotension: Yes Has patient had a PCN reaction causing severe rash involving mucus membranes or skin necrosis: No Has patient had a PCN reaction that required hospitalization: Yes Has patient had a PCN reaction occurring within the last 10 years: No If all of the above answers are "NO", then may proceed with Cephalosporin use.    VITALS:  Blood pressure 121/75, pulse (!) 53, temperature 97.8 F (36.6 C), temperature source Oral, resp. rate 16, height 6\' 2"  (1.88 m), weight 81.8 kg, SpO2 98  %.  PHYSICAL EXAMINATION:   Physical Exam  GENERAL:  62 y.o.-year-old patient lying in the bed with no acute distress.  EYES: Pupils equal, round, reactive to light and accommodation. No scleral icterus. Extraocular muscles intact.  HEENT: Head atraumatic, normocephalic. Oropharynx and nasopharynx clear.  NECK:  Supple, no jugular venous distention. No thyroid enlargement, no tenderness.  LUNGS: Normal breath sounds bilaterally, no wheezing, rales, rhonchi. No use of accessory muscles of respiration.  CARDIOVASCULAR: S1, S2 normal. No murmurs, rubs, or gallops.  ABDOMEN: Soft, nontender, nondistended. Bowel sounds present. No organomegaly or mass.  EXTREMITIES: No cyanosis, clubbing or edema b/l.    NEUROLOGIC: Cranial nerves II through XII are intact. No focal Motor or sensory deficits b/l.   PSYCHIATRIC: The patient is alert and oriented x 3.  SKIN: No obvious rash, lesion, or ulcer.   LABORATORY PANEL:   CBC Recent Labs  Lab 01/28/18 1005  WBC 5.1  HGB 14.4  HCT 42.5  PLT 183   ------------------------------------------------------------------------------------------------------------------ Chemistries  Recent Labs  Lab 01/28/18 1005  NA 138  K 3.9  CL 105  CO2 25  GLUCOSE 113*  BUN 13  CREATININE 0.98  CALCIUM 9.3   ------------------------------------------------------------------------------------------------------------------  Cardiac Enzymes Recent Labs  Lab 01/28/18 1732  TROPONINI <0.03   ------------------------------------------------------------------------------------------------------------------  RADIOLOGY:  Dg Chest 2 View  Result Date: 01/28/2018 CLINICAL DATA:  Chest pain and shortness of breath EXAM: CHEST - 2 VIEW COMPARISON:  None. FINDINGS: There is a nodular opacity over the left mid lung most consistent with a nipple shadow on the lateral view. There is no focal consolidation.  The lungs are hyperinflated likely secondary to COPD. There  is no pleural effusion or pneumothorax. The heart and mediastinal contours are unremarkable. There is no acute osseous abnormality. There is mild wedge compression deformity of a midthoracic vertebral body likely chronic. IMPRESSION: No acute cardiopulmonary disease. Electronically Signed   By: Elige Ko   On: 01/28/2018 10:10     ASSESSMENT AND PLAN:  [age]-year-old male patient with history of coronary artery disease, drug-eluting stent in the proximal circumflex who finished a course of Brilinta and high intensity statin with ongoing tobacco abuse currently under hospitalist service for chest pain  -Chest pain Cardiac cath today by cardiology Status post cardiology follow-up Continue oral Plavix, beta-blocker,statin Serial troponin negative  -Tobacco abuse Tobacco cessation counseled to the patient for 6 minutes Nicotine patch offered  -DVT prophylaxis Continue subcu Lovenox  -Hypertension Continue beta-blocker   All the records are reviewed and case discussed with Care Management/Social Worker. Management plans discussed with the patient, family and they are in agreement.  CODE STATUS: Full code  DVT Prophylaxis: SCDs  TOTAL TIME TAKING CARE OF THIS PATIENT: 35 minutes.   POSSIBLE D/C IN 1 DAYS, DEPENDING ON CLINICAL CONDITION.  Ihor Austin M.D on 01/29/2018 at 3:45 PM  Between 7am to 6pm - Pager - [phone]  After 6pm go to [email or web address] - password [email or web address]  [facility] [place] [facility]  Office  [phone]  CC: Primary care physician; Patient, No Pcp Per  Note: This dictation was prepared with Dragon dictation along with smaller phrase technology. Any transcriptional errors that result from this process are unintentional.

## 2018-01-30 ENCOUNTER — Encounter: Payer: Self-pay | Admitting: Internal Medicine

## 2018-01-30 DIAGNOSIS — E782 Mixed hyperlipidemia: Secondary | ICD-10-CM

## 2018-01-30 DIAGNOSIS — I25118 Atherosclerotic heart disease of native coronary artery with other forms of angina pectoris: Secondary | ICD-10-CM

## 2018-01-30 DIAGNOSIS — F17200 Nicotine dependence, unspecified, uncomplicated: Secondary | ICD-10-CM

## 2018-01-30 LAB — BASIC METABOLIC PANEL
Anion gap: 6 (ref 5–15)
BUN: 15 mg/dL (ref 8–23)
CALCIUM: 8.7 mg/dL — AB (ref 8.9–10.3)
CO2: 26 mmol/L (ref 22–32)
CREATININE: 0.86 mg/dL (ref 0.61–1.24)
Chloride: 106 mmol/L (ref 98–111)
GFR calc Af Amer: >60 mL/min (ref >60)
GLUCOSE: 95 mg/dL (ref 70–99)
POTASSIUM: 4.1 mmol/L (ref 3.5–5.1)
SODIUM: 138 mmol/L (ref 135–145)

## 2018-01-30 LAB — CBC
HCT: 37.9 % — ABNORMAL LOW (ref 39.0–52.0)
HEMOGLOBIN: 12.6 g/dL — AB (ref 13.0–17.0)
MCH: 31 pg (ref 26.0–34.0)
MCHC: 33.2 g/dL (ref 30.0–36.0)
MCV: 93.3 fL (ref 80.0–100.0)
Platelets: 167 10*3/uL (ref 150–400)
RBC: 4.06 MIL/uL — ABNORMAL LOW (ref 4.22–5.81)
RDW: 12.3 % (ref 11.5–15.5)
WBC: 5.3 10*3/uL (ref 4.0–10.5)
nRBC: 0 % (ref 0.0–0.2)

## 2018-01-30 MED ORDER — ISOSORBIDE MONONITRATE ER 30 MG PO TB24: 15.0000 mg | ORAL_TABLET | Freq: Every day | ORAL | 0 refills | Status: DC

## 2018-01-30 MED ORDER — CLOPIDOGREL BISULFATE 75 MG PO TABS: 75.0000 mg | ORAL_TABLET | Freq: Every day | ORAL | 0 refills | Status: DC

## 2018-01-30 MED ORDER — METOPROLOL SUCCINATE ER 25 MG PO TB24
12.5000 mg | ORAL_TABLET | Freq: Every day | ORAL | Status: DC
  Filled 2018-01-30: qty 1

## 2018-01-30 MED ORDER — ISOSORBIDE MONONITRATE ER 30 MG PO TB24: 15.0000 mg | ORAL_TABLET | Freq: Every day | ORAL | Status: DC

## 2018-01-30 MED ORDER — ATORVASTATIN CALCIUM 80 MG PO TABS: 80.0000 mg | ORAL_TABLET | Freq: Every day | ORAL | 1 refills | Status: DC

## 2018-01-30 NOTE — Progress Notes (Signed)
Progress Note  Patient Name: Aaron Knox [facility] Date of Encounter: 01/30/2018  Primary Cardiologist:   Subjective   No chest pain overnight Ambulating around the unit without symptoms Would like to go home No primary care physician Still smoking 1 pack/day down from 2 packs/day  Inpatient Medications    Inpatient Medications: Scheduled Meds: . [START ON 01/29/2018] clopidogrel  75 mg Oral Daily  . enoxaparin (LOVENOX) injection  40 mg Subcutaneous Q24H  . metoprolol tartrate  25 mg Oral BID     Vital Signs    Vitals:   01/29/18 2033 01/30/18 0422 01/30/18 0811 01/30/18 1013  BP: (!) 93/50 (!) 101/55 (!) 109/51 (!) 104/54  Pulse: (!) 59 (!) 51 (!) 57 (!) 58  Resp: 16 18 18    Temp: 98.3 F (36.8 C) 98 F (36.7 C) 98.1 F (36.7 C)   TempSrc: Oral Oral Oral   SpO2: 96% 98% 99%   Weight:      Height:        Intake/Output Summary (Last 24 hours) at 01/30/2018 1716 Last data filed at 01/30/2018 0423 Gross per 24 hour  Intake 952.45 ml  Output 475 ml  Net 477.45 ml   Filed Weights   01/28/18 0923 01/28/18 1233  Weight: 83.9 kg 81.8 kg    Telemetry    Normal sinus rhythm- Personally Reviewed  ECG      Physical Exam   GEN: No acute distress.   Neck: No JVD Cardiac: RRR, no murmurs, rubs, or gallops.  Respiratory: Clear to auscultation bilaterally. GI: Soft, nontender, non-distended  MS: No edema; No deformity. Neuro:  Nonfocal  Psych: Normal affect   Labs    Chemistry Recent Labs  Lab 01/28/18 1005 01/30/18 0408  NA 138 138  K 3.9 4.1  CL 105 106  CO2 25 26  GLUCOSE 113* 95  BUN 13 15  CREATININE 0.98 0.86  CALCIUM 9.3 8.7*  GFRNONAA >60 >60  GFRAA >60 >60  ANIONGAP 8 6     Hematology Recent Labs  Lab 01/28/18 1005 01/30/18 0408  WBC 5.1 5.3  RBC 4.62 4.06*  HGB 14.4 12.6*  HCT 42.5 37.9*  MCV 92.0 93.3  MCH 31.2 31.0  MCHC 33.9 33.2  RDW 12.5 12.3  PLT 183 167    Cardiac Enzymes Recent Labs  Lab 01/28/18 1005  01/28/18 1146 01/28/18 1430 01/28/18 1732  TROPONINI <0.03 <0.03 <0.03 <0.03   No results for input(s): TROPIPOC in the last 168 hours.   BNPNo results for input(s): BNP, PROBNP in the last 168 hours.   DDimer No results for input(s): DDIMER in the last 168 hours.   Radiology    No results found.  Cardiac Studies  Cardiac catheterization yesterday Conclusions: 1. Significant single-vessel coronary artery disease, with 50% proximal and 80% distal RCA stenoses; RCA supplies relatively small PDA and PL branches. 2. Mild to moderate, non-obstructive CAD involving the left coronary artery with widely patent stent in the proximal LCx. 3. Normal left ventricular filling pressure.  Recommendations: 1. Medical therapy of single-vessel coronary artery disease.  I add isosorbide mononitrate 30 mg daily and continue metoprolol tartrate 25 mg twice daily.  If patient has recurrent chest pain, PCI to distal RCA could be considered (will need to discuss aspirin desensitization versus alternative antiplatelet strategies given aspirin allergy). 2. Aggressive secondary prevention, including high-intensity statin therapy and smoking cessation. 3. Monitor overnight.  If no further chest pain, likely discharge home tomorrow.  Recommend clopidogrel 75 mg daily  for moderate to severe CAD and history of aspirin allergy.    Patient Profile     Aaron Knox is a 62 y.o. male with a hx of CAD with posterior ST elevation MI in 08/2014 as detailed below, systolic dysfunction, medical noncompliance, and tobacco abuse abuse who is being seen today for the evaluation of chest pain   Assessment & Plan    1. Unstable angina/CAD: Known coronary artery disease with proximal left circumflex stent in the past Presenting on no medications with chest pain symptoms concerning for unstable angina Cardiac catheterization yesterday showing distal RCA disease, medical management Moderate proximal RCA  disease Patent stent in proximal circumflex No chest pain overnight, ambulating without any symptoms, wishing to go home -Was started on Imdur 30 and metoprolol 25 twice daily Secondary to bradycardia and hypotension metoprolol held Imdur down to 15 mg daily Started on Plavix -Not on ASA due to allergy (anaphylaxis) Started on statin  2) smoker Smoking cessation recommended -Declines nicotine patch at this time   Total encounter time more than 25 minutes  Greater than 50% was spent in counseling and coordination of care with the patient   For questions or updates, please contact [facility] Please consult [email or web address] for contact info under        Signed, Julien Nordmann, MD  01/30/2018, 5:16 PM

## 2018-01-30 NOTE — Plan of Care (Signed)
Right radial site without complications. 

## 2018-01-30 NOTE — Discharge Summary (Signed)
[facility] [place] [facility]   PATIENT NAME: Aaron Knox    MR#:  [id number]  DATE OF BIRTH:  1955-09-16  DATE OF ADMISSION:  01/28/2018 ADMITTING PHYSICIAN: Enedina Finner, MD  DATE OF DISCHARGE: 01/30/2018  1:10 PM  PRIMARY CARE PHYSICIAN: 01/30/2018   ADMISSION DIAGNOSIS:  Precordial pain [R07.2] Chest pain with moderate risk for cardiac etiology [R07.9]  DISCHARGE DIAGNOSIS:  Active Problems:   Chest pain   Unstable angina (HCC) Coronary artery disease Systolic dysfunction  SECONDARY DIAGNOSIS:   Past Medical History:  Diagnosis Date  . CAD in native artery    a. posterior STEMI 08/2014; b. LHC 5/16 - 99% stenosis in the proximal left circumflex, 40-50% mid left circumflex stenosis and mild nonobstructive disease affecting the LAD and RCA, successful PCI/DES to pLCx, EF 40%  . Systolic dysfunction    a. TTE 5/16: EF 45-50%, Gr1DD, inferior HK, aortic sclerosis w/o stenosis     ADMITTING HISTORY Aaron Knox  is a 62 y.o. male with a known history of CAD status post drug-eluting stent in proximal circumflex three years ago finished the course with Belinda and statin, ongoing tobacco abuse comes to the emergency room after he woke up feeling short winded and heaviness around his chest. Patient said it was around 7/10 and at present it is around 4/10. He doesn't take any medications. Does not have primary care and neither does he follow up with Dr. Kirke Corin due to insurance issues.Troponin is negative. EKG shows sinus tachycardia.Patient is being admitted for chest pain rule out MI.  HOSPITAL COURSE:  Patient was admitted to telemetry.  Serial troponins were negative.  Patient was evaluated by cardiology and had cardiac cath.  1. Significant single-vessel coronary artery disease, with 50% proximal and 80% distal RCA stenoses; RCA supplies relatively small PDA and PL branches. 2. Mild to moderate, non-obstructive CAD involving the left coronary artery  with widely patent stent in the proximal LCx. 3. Normal left ventricular filling pressure.  Recommendations: 1. Medical therapy of single-vessel coronary artery disease.  If patient has recurrent chest pain, PCI to distal RCA could be considered  2. Aggressive secondary prevention, including high-intensity statin therapy and smoking cessation. 3. Monitor overnight.  If no further chest pain, likely discharge home tomorrow.  Recommend clopidogrel 75 mg daily for moderate to severe CAD and history of aspirin allergy.  CONSULTS OBTAINED:  Treatment Team:  Iran Ouch, MD  DRUG ALLERGIES:   Allergies  Allergen Reactions  . Aspirin Anaphylaxis  . Penicillins Anaphylaxis    Has patient had a PCN reaction causing immediate rash, facial/tongue/throat swelling, SOB or lightheadedness with hypotension: Yes Has patient had a PCN reaction causing severe rash involving mucus membranes or skin necrosis: No Has patient had a PCN reaction that required hospitalization: Yes Has patient had a PCN reaction occurring within the last 10 years: No If all of the above answers are "NO", then may proceed with Cephalosporin use.    DISCHARGE MEDICATIONS:   Allergies as of 01/30/2018      Reactions   Aspirin Anaphylaxis   Penicillins Anaphylaxis   Has patient had a PCN reaction causing immediate rash, facial/tongue/throat swelling, SOB or lightheadedness with hypotension: Yes Has patient had a PCN reaction causing severe rash involving mucus membranes or skin necrosis: No Has patient had a PCN reaction that required hospitalization: Yes Has patient had a PCN reaction occurring within the last 10 years: No If all of the above answers are "NO", then  may proceed with Cephalosporin use.      Medication List    TAKE these medications   atorvastatin 80 MG tablet Commonly known as:  LIPITOR Take 1 tablet (80 mg total) by mouth daily at 6 PM.   clopidogrel 75 MG tablet Commonly known as:   PLAVIX Take 1 tablet (75 mg total) by mouth daily.   isosorbide mononitrate 30 MG 24 hr tablet Commonly known as:  IMDUR Take 0.5 tablets (15 mg total) by mouth daily. Start taking on:  01/31/2018       Today  Patient seen and evaluated today No chest pain No shortness of breath  VITAL SIGNS:  Blood pressure (!) 104/54, pulse (!) 58, temperature 98.1 F (36.7 C), temperature source Oral, resp. rate 18, height 6\' 2"  (1.88 m), weight 81.8 kg, SpO2 99 %.  I/O:    Intake/Output Summary (Last 24 hours) at 01/30/2018 1555 Last data filed at 01/30/2018 0423 Gross per 24 hour  Intake 1686.58 ml  Output 475 ml  Net 1211.58 ml    PHYSICAL EXAMINATION:  Physical Exam  GENERAL:  62 y.o.-year-old patient lying in the bed with no acute distress.  LUNGS: Normal breath sounds bilaterally, no wheezing, rales,rhonchi or crepitation. No use of accessory muscles of respiration.  CARDIOVASCULAR: S1, S2 normal. No murmurs, rubs, or gallops.  ABDOMEN: Soft, non-tender, non-distended. Bowel sounds present. No organomegaly or mass.  NEUROLOGIC: Moves all 4 extremities. PSYCHIATRIC: The patient is alert and oriented x 3.  SKIN: No obvious rash, lesion, or ulcer.   DATA REVIEW:   CBC Recent Labs  Lab 01/30/18 0408  WBC 5.3  HGB 12.6*  HCT 37.9*  PLT 167    Chemistries  Recent Labs  Lab 01/30/18 0408  NA 138  K 4.1  CL 106  CO2 26  GLUCOSE 95  BUN 15  CREATININE 0.86  CALCIUM 8.7*    Cardiac Enzymes Recent Labs  Lab 01/28/18 1732  TROPONINI <0.03    Microbiology Results  Results for orders placed or performed during the hospital encounter of 08/09/14  MRSA PCR Screening     Status: None   Collection Time: 08/09/14  4:56 PM  Result Value Ref Range Status   MRSA by PCR NEGATIVE NEGATIVE Final    Comment:        The GeneXpert MRSA Assay (FDA approved for NASAL specimens only), is one component of a comprehensive MRSA colonization surveillance program. It is  not intended to diagnose MRSA infection nor to guide or monitor treatment for MRSA infections.     RADIOLOGY:  No results found.  Follow up with PCP in 1 week.  Management plans discussed with the patient, family and they are in agreement.  CODE STATUS: Full code    Code Status Orders  (From admission, onward)         Start     Ordered   01/28/18 1146  Full code  Continuous     01/28/18 1145        Code Status History    Date Active Date Inactive Code Status Order ID Comments User Context   08/09/2014 1940 08/12/2014 1453 Full Code [id number]  Charlann Boxer, RN Inpatient   08/09/2014 1646 08/09/2014 1923 Full Code [id number]  Iran Ouch, MD ED      TOTAL TIME TAKING CARE OF THIS PATIENT ON DAY OF DISCHARGE: more than 34 minutes.   Ihor Austin M.D on 01/30/2018 at 3:55 PM  Between 7am to 6pm -  Pager - [phone]  After 6pm go to [email or web address] - password [email or web address]  [facility]  Office  [phone]  CC: Primary care physician; Patient, No Pcp Per  Note: This dictation was prepared with Dragon dictation along with smaller phrase technology. Any transcriptional errors that result from this process are unintentional.

## 2018-01-30 NOTE — Progress Notes (Signed)
Patient discharged hoem today VSS, medication management set up and prescriptions provided to patient with patient being educated on dosage and regimen. Patient does not have follow up appt provided due to lack of insurance informed [facility] of [facility]  [place] that operates based on sliding scale in order to find a PCP. Patoent has no complaints or complications at radial site at this time, will drive self.

## 2018-01-31 ENCOUNTER — Telehealth: Payer: Self-pay | Admitting: Cardiovascular Disease

## 2018-01-31 NOTE — Telephone Encounter (Signed)
l mom to schedule tcm appointment

## 2018-01-31 NOTE — Telephone Encounter (Signed)
Patient contacted regarding discharge from [facility] on 01/30/18.   Patient understands to follow up with provider ? On 02/11/18 at 09:40 am at [facility].  Patient understands discharge instructions? Yes Patient understands medications and regiment? Yes Patient understands to bring all medications to this visit? Yes

## 2018-01-31 NOTE — Telephone Encounter (Signed)
Patient is scheduled for 02/11/18 for TCM

## 2018-02-10 NOTE — Progress Notes (Signed)
Cardiology Office Note  Date:  02/11/2018   ID:  Aaron Knox, DOB 09-Mar-1956, MRN [id number]  PCP:  Patient, No Pcp Per   Chief Complaint  Patient presents with  . other    F/u post cardiac cath c/o feeling like he has a huge lump in his throat when taking medications. D/C isosorbide due to lightheadness/dizziness. Meds reviewed verbally with pt.    HPI:  62 year old male with history of  coronary artery disease  posterior ST elevation myocardial infarction in May 2016  PCI and drug-eluting stent placement to the proximal left circumflex,  hyperlipidemia  tobacco use.    took his cardiac medications for only 1 year after his myocardial infarction  . Presenting to the hospital 01/2018 with exertional fatigue  worsening shortness of breath.   severe substernal chest pain similar to his myocardial infarction in 2016   allergic to aspirin due to anaphylaxis.   troponin has been negative.  Cardiac cath: 1. Significant single-vessel coronary artery disease, with 50% proximal and 80% distal RCA stenoses; RCA supplies relatively small PDA and PL branches. 2. Mild to moderate, non-obstructive CAD involving the left coronary artery with widely patent stent in the proximal LCx. 3. Normal left ventricular filling pressure. Medical management  If patient has recurrent chest pain, PCI to distal RCA could be considered (will need to discuss aspirin desensitization versus alternative antiplatelet strategies given aspirin allergy).  Echo 01/28/2018 estimated ejection fraction was in the range of 55% to 60%.   Low pressure Did not tolerate imdur, stopped it  plavix sticks in his throat in the AM  SOB and poor energy 9 cigs a day  EKG personally reviewed by myself on todays visit Shows normal sinus rhythm rate 86 bpm consider lateral infarct, APCs no significant ST-T wave changes   PMH:   has a past medical history of CAD in native artery and Systolic dysfunction.  PSH:    Past  Surgical History:  Procedure Laterality Date  . CARDIAC CATHETERIZATION N/A 08/09/2014   Procedure: Left Heart Cath and Coronary Angiography;  Surgeon: Iran Ouch, MD;  [facility]: [facility] INV[facility] CV LAB;  Service: Cardiovascular;  Laterality: N/A;  . CARDIAC CATHETERIZATION N/A 08/09/2014   Procedure: Coronary Stent Intervention;  Surgeon: Iran Ouch, MD;  Location: [facility] INVASIVE CV LAB;  Service: Cardiovascular;  Laterality: N/A;  . CORONARY ANGIOPLASTY    . LEFT HEART CATH AND CORONARY ANGIOGRAPHY N/A 01/29/2018   Procedure: LEFT HEART CATH AND CORONARY ANGIOGRAPHY;  Surgeon: Yvonne Kendall, MD;  Location: [facility] INVASIVE CV LAB;  Service: Cardiovascular;  Laterality: N/A;    Current Outpatient Medications  Medication Sig Dispense Refill  . atorvastatin (LIPITOR) 80 MG tablet Take 1 tablet (80 mg total) by mouth daily at 6 PM. 90 tablet 3  . clopidogrel (PLAVIX) 75 MG tablet Take 1 tablet (75 mg total) by mouth daily. 90 tablet 3  . nitroGLYCERIN (NITROSTAT) 0.4 MG SL tablet Place 1 tablet (0.4 mg total) under the tongue every 5 (five) minutes as needed for chest pain. 25 tablet 3   No current facility-administered medications for this visit.      Allergies:   Aspirin and Penicillins   Social History:  The patient  reports that he has been smoking cigarettes. He has been smoking about 0.25 packs per day. He has never used smokeless tobacco. He reports that he drinks about 3.0 standard drinks of alcohol per week. He reports that he does not use drugs.   Family  History:   family history includes CAD in his maternal grandfather.    Review of Systems: Review of Systems  Constitutional: Negative.   Respiratory: Negative.   Cardiovascular: Negative.   Gastrointestinal: Negative.   Musculoskeletal: Negative.   Neurological: Negative.   Psychiatric/Behavioral: Negative.   All other systems reviewed and are negative.    PHYSICAL EXAM: VS:  BP 108/78 (BP Location: Left Arm, Patient  Position: Sitting, Cuff Size: Normal)   Pulse 86   Ht 6\' 2"  (1.88 m)   Wt 182 lb 12 oz (82.9 kg)   BMI 23.46 kg/m  , BMI Body mass index is 23.46 kg/m. GEN: Well nourished, well developed, in no acute distress  HEENT: normal  Neck: no JVD, carotid bruits, or masses Cardiac: RRR; no murmurs, rubs, or gallops,no edema  Respiratory:  clear to auscultation bilaterally, normal work of breathing GI: soft, nontender, nondistended, + BS MS: no deformity or atrophy  Skin: warm and dry, no rash Neuro:  Strength and sensation are intact Psych: euthymic mood, full affect   Recent Labs: 01/30/2018: BUN 15; Creatinine, Ser 0.86; Hemoglobin 12.6; Platelets 167; Potassium 4.1; Sodium 138    Lipid Panel Lab Results  Component Value Date   CHOL 215 (H) 01/28/2018   HDL 28 (L) 01/28/2018   LDLCALC 135 (H) 01/28/2018   TRIG 260 (H) 01/28/2018      Wt Readings from Last 3 Encounters:  02/11/18 182 lb 12 oz (82.9 kg)  01/28/18 180 lb 4.8 oz (81.8 kg)  08/31/14 181 lb (82.1 kg)       ASSESSMENT AND PLAN:  Atherosclerosis of native coronary artery of native heart with stable angina pectoris (HCC) - Plan: EKG 12-Lead Denies any anginal symptoms We have placed order for sublingual nitro He was unable to tolerate Imdur secondary to hypotension Thinks he is having a problem with Plavix, takes this in the morning, feels like it is sticking in his throat, tightness in lower esophagus Recommend he try this in the evening with his Lipitor If he continues have symptoms recommended he call our office, might need to hold the medication He is concerned as he had similar problems with aspirin  Centrilobular emphysema (HCC) - Plan: EKG 12-Lead Reports he is weaning down on his cigarettes Down to 13/day, previously 2 packs/day Chronic shortness of breath  Mixed hyperlipidemia Recommend he stay on his Lipitor  Smoker We have encouraged him to continue to work on weaning his cigarettes and  smoking cessation. He will continue to work on this and does not want any assistance with chantix.   Disposition:   F/U  12 months   Total encounter time more than 25 minutes  Greater than 50% was spent in counseling and coordination of care with the patient    Orders Placed This Encounter  Procedures  . EKG 12-Lead     Signed, Dossie Arbour, M.D., Ph.D. 02/11/2018  [facility] [place], [place] [phone]

## 2018-02-11 ENCOUNTER — Encounter: Payer: Self-pay | Admitting: Cardiovascular Disease

## 2018-02-11 ENCOUNTER — Ambulatory Visit: Payer: Self-pay | Admitting: Cardiovascular Disease

## 2018-02-11 VITALS — BP 108/78 | HR 86 | Ht 74.0 in | Wt 182.8 lb

## 2018-02-11 DIAGNOSIS — J432 Centrilobular emphysema: Secondary | ICD-10-CM | POA: Insufficient documentation

## 2018-02-11 DIAGNOSIS — F17200 Nicotine dependence, unspecified, uncomplicated: Secondary | ICD-10-CM

## 2018-02-11 DIAGNOSIS — I25118 Atherosclerotic heart disease of native coronary artery with other forms of angina pectoris: Secondary | ICD-10-CM | POA: Insufficient documentation

## 2018-02-11 DIAGNOSIS — E782 Mixed hyperlipidemia: Secondary | ICD-10-CM

## 2018-02-11 MED ORDER — ATORVASTATIN CALCIUM 80 MG PO TABS: 80.0000 mg | ORAL_TABLET | Freq: Every day | ORAL | 3 refills | Status: DC

## 2018-02-11 MED ORDER — NITROGLYCERIN 0.4 MG SL SUBL: 0.4000 mg | SUBLINGUAL_TABLET | SUBLINGUAL | 3 refills | Status: DC | PRN

## 2018-02-11 MED ORDER — CLOPIDOGREL BISULFATE 75 MG PO TABS: 75.0000 mg | ORAL_TABLET | Freq: Every day | ORAL | 3 refills | Status: AC

## 2018-02-11 NOTE — Patient Instructions (Signed)

## 2018-10-22 ENCOUNTER — Encounter: Payer: Self-pay | Admitting: *Deleted

## 2018-10-22 ENCOUNTER — Other Ambulatory Visit: Payer: Self-pay

## 2018-10-22 ENCOUNTER — Emergency Department
Admission: EM | Admit: 2018-10-22 | Discharge: 2018-10-22 | Disposition: A | Discharge status: Home / Self Care | Payer: Medicare Other | Attending: Emergency Medicine | Admitting: Emergency Medicine

## 2018-10-22 ENCOUNTER — Emergency Department: Payer: Medicare Other

## 2018-10-22 DIAGNOSIS — S61432A Puncture wound without foreign body of left hand, initial encounter: Secondary | ICD-10-CM | POA: Diagnosis not present

## 2018-10-22 DIAGNOSIS — Y999 Unspecified external cause status: Secondary | ICD-10-CM | POA: Diagnosis not present

## 2018-10-22 DIAGNOSIS — F17210 Nicotine dependence, cigarettes, uncomplicated: Secondary | ICD-10-CM | POA: Insufficient documentation

## 2018-10-22 DIAGNOSIS — I2510 Atherosclerotic heart disease of native coronary artery without angina pectoris: Secondary | ICD-10-CM | POA: Insufficient documentation

## 2018-10-22 DIAGNOSIS — Y939 Activity, unspecified: Secondary | ICD-10-CM | POA: Diagnosis not present

## 2018-10-22 DIAGNOSIS — Y929 Unspecified place or not applicable: Secondary | ICD-10-CM | POA: Insufficient documentation

## 2018-10-22 DIAGNOSIS — W312XXA Contact with powered woodworking and forming machines, initial encounter: Secondary | ICD-10-CM | POA: Diagnosis not present

## 2018-10-22 DIAGNOSIS — S6992XA Unspecified injury of left wrist, hand and finger(s), initial encounter: Secondary | ICD-10-CM | POA: Diagnosis present

## 2018-10-22 MED ORDER — SULFAMETHOXAZOLE-TRIMETHOPRIM 800-160 MG PO TABS: 1.0000 | ORAL_TABLET | Freq: Two times a day (BID) | ORAL | 0 refills | Status: AC

## 2018-10-22 NOTE — ED Notes (Signed)
Splint applied to left wrist.

## 2018-10-22 NOTE — ED Notes (Signed)
See triage note  States he was using a drill with a long phillips head bit  Slip and went thur left hand

## 2018-10-22 NOTE — ED Notes (Signed)
PA at bedside.

## 2018-10-22 NOTE — ED Triage Notes (Signed)
Pt to ED after puncturing left hand with a drill bit. Pt reporting pain in the left hand middle finger joint. Slight swelling noted. Bleeding under control at this time.

## 2018-10-22 NOTE — ED Provider Notes (Signed)
[facility] Emergency Department Provider Note  ____________________________________________  Time seen: Approximately 8:21 PM  I have reviewed the triage vital signs and the nursing notes.   HISTORY  Chief Complaint Puncture    HPI Aaron Knox is a 63 y.o. male presents to the emergency department with left hand pain after he was using a screw gun and punctured hand.  Patient states that he has been able to move his fingers easily since injury occurred but does have some pain along the dorsal aspect of the left hand.  Patient has had some numbness and tingling around puncture site. Patient reports that he has had multiple surgeries on affected hand due to prior tendon injuries and became concerned.  No alleviating measures have been attempted.        Past Medical History:  Diagnosis Date  . CAD in native artery    a. posterior STEMI 08/2014; b. LHC 5/16 - 99% stenosis in the proximal left circumflex, 40-50% mid left circumflex stenosis and mild nonobstructive disease affecting the LAD and RCA, successful PCI/DES to pLCx, EF 40%  . Systolic dysfunction    a. TTE 5/16: EF 45-50%, Gr1DD, inferior HK, aortic sclerosis w/o stenosis    Patient Active Problem List   Diagnosis Date Noted  . Atherosclerosis of native coronary artery of native heart with stable angina pectoris (HCC) 02/11/2018  . Centrilobular emphysema (HCC) 02/11/2018  . Unstable angina (HCC)   . Chest pain 01/28/2018  . Coronary artery disease involving native coronary artery without angina pectoris 08/31/2014  . Hyperlipidemia 08/31/2014  . Hypokalemia 08/11/2014  . Smoker 08/11/2014  . ST elevation myocardial infarction (STEMI) of true posterior wall (HCC) 08/09/2014    Past Surgical History:  Procedure Laterality Date  . CARDIAC CATHETERIZATION N/A 08/09/2014   Procedure: Left Heart Cath and Coronary Angiography;  Surgeon: Iran OuchMuhammad A Arida, MD;  Location: [facility] IN[facility];   Service: Cardiovascular;  Laterality: N/A;  . CARDIAC CATHETERIZATION N/A 08/09/2014   Procedure: Coronary Stent Intervention;  Surgeon: Iran OuchMuhammad A Arida, MD;  Location: [facility] INVAS[facility] CV LAB;  Service: Cardiovascular;  Laterality: N/A;  . CORONARY ANGIOPLASTY    . LEFT HEART CATH AND CORONARY ANGIOGRAPHY N/A 01/29/2018   Procedure: LEFT HEART CATH AND CORONARY ANGIOGRAPHY;  Surgeon: Yvonne KendallEnd, Christopher, MD;  Location: [facility] INVASIVE CV LAB;  Service: Cardiovascular;  Laterality: N/A;    Prior to Admission medications   Medication Sig Start Date End Date Taking? Authorizing Provider  atorvastatin (LIPITOR) 80 MG tablet Take 1 tablet (80 mg total) by mouth daily at 6 PM. 02/11/18 03/13/18  Gollan, Tollie Pizzaimothy J, MD  nitroGLYCERIN (NITROSTAT) 0.4 MG SL tablet Place 1 tablet (0.4 mg total) under the tongue every 5 (five) minutes as needed for chest pain. 02/11/18   Antonieta IbaGollan, Timothy J, MD  sulfamethoxazole-trimethoprim (BACTRIM DS) 800-160 MG tablet Take 1 tablet by mouth 2 (two) times daily for 7 days. 10/22/18 10/29/18  Orvil FeilWoods, Emelie Newsom M, PA-C    Allergies Aspirin and Penicillins  Family History  Problem Relation Age of Onset  . CAD Maternal Grandfather     Social History Social History   Tobacco Use  . Smoking status: Current Every Day Smoker    Packs/day: 0.25    Types: Cigarettes  . Smokeless tobacco: Never Used  Substance Use Topics  . Alcohol use: Yes    Alcohol/week: 3.0 standard drinks    Types: 3 Glasses of wine per week  . Drug use: No  Review of Systems  Constitutional: No fever/chills Eyes: No visual changes. No discharge ENT: No upper respiratory complaints. Cardiovascular: no chest pain. Respiratory: no cough. No SOB. Gastrointestinal: No abdominal pain.  No nausea, no vomiting.  No diarrhea.  No constipation. Musculoskeletal: Patient has left hand pain.  Skin: Patient has puncture wound of left hand.  Neurological: Negative for headaches, focal weakness or  numbness.   ____________________________________________   PHYSICAL EXAM:  VITAL SIGNS: ED Triage Vitals  Enc Vitals Group     BP 10/22/18 1812 132/78     Pulse Rate 10/22/18 1812 90     Resp 10/22/18 1812 18     Temp 10/22/18 1812 98.2 F (36.8 C)     Temp Source 10/22/18 1812 Oral     SpO2 10/22/18 1812 97 %     Weight 10/22/18 1820 182 lb 12.2 oz (82.9 kg)     Height --      Head Circumference --      Peak Flow --      Pain Score 10/22/18 1820 2     Pain Loc --      Pain Edu? --      Excl. in [place]? --      Constitutional: Alert and oriented. Well appearing and in no acute distress. Eyes: Conjunctivae are normal. PERRL. EOMI. Head: Atraumatic. Cardiovascular: Normal rate, regular rhythm. Normal S1 and S2.  Good peripheral circulation. Respiratory: Normal respiratory effort without tachypnea or retractions. Lungs CTAB. Good air entry to the bases with no decreased or absent breath sounds. Musculoskeletal: Patient is able to move all 5 left fingers.  He has some mild swelling along the dorsal aspect of the left hand.  No flexor or extensor tendon deficits are appreciated with testing of all 5 left fingers.  Palpable radial pulse bilaterally and symmetrically. Neurologic:  Normal speech and language. No gross focal neurologic deficits are appreciated.  Skin: Patient has puncture wound site along the volar aspect of the left hand. Psychiatric: Mood and affect are normal. Speech and behavior are normal. Patient exhibits appropriate insight and judgement.   ____________________________________________   LABS (all labs ordered are listed, but only abnormal results are displayed)  Labs Reviewed - No data to display ____________________________________________  EKG   ____________________________________________  RADIOLOGY I personally viewed and evaluated these images as part of my medical decision making, as well as reviewing the written report by the radiologist.  Dg  Hand Complete Left  Result Date: 10/22/2018 CLINICAL DATA:  Puncture left hand with drill bit. EXAM: LEFT HAND - COMPLETE 3+ VIEW COMPARISON:  None. FINDINGS: Osteoarthritic changes noted at the 1st carpometacarpal joint and 1st MCP joint. No acute bony abnormality. Specifically, no fracture, subluxation, or dislocation. No radiopaque foreign bodies. IMPRESSION: No fracture or foreign body. Electronically Signed   By: Rolm Baptise M.D.   On: 10/22/2018 18:52    ____________________________________________    PROCEDURES  Procedure(s) performed:    Procedures  LACERATION REPAIR Performed by: Lannie Fields Authorized by: Lannie Fields Consent: Verbal consent obtained. Risks and benefits: risks, benefits and alternatives were discussed Consent given by: patient Patient identity confirmed: provided demographic data Prepped and Draped in normal sterile fashion Wound explored  Laceration Location: Left palm.   Laceration Length: 1 cm  No Foreign Bodies seen or palpated  Anesthesia: local infiltration  Irrigation method: syringe Amount of cleaning: standard  Skin closure: Dermabond.    Patient tolerance: Patient tolerated the procedure well with no immediate complications.  Medications - No data to display   ____________________________________________   INITIAL IMPRESSION / ASSESSMENT AND PLAN / ED COURSE  Pertinent labs & imaging results that were available during my care of the patient were reviewed by me and considered in my medical decision making (see chart for details).  Review of the [place] CSRS was performed in accordance of the [facility] prior to dispensing any controlled drugs.         Assessment and plan Puncture 63 year old male presents to the emergency department with a puncture wound of the left hand.  Physical exam was reassuring without obvious flexor or extensor tendon injury.  Patient's wound was irrigated in the emergency department and Dermabond  was applied at  puncture wound site.  A dressing was applied and patient was placed in a Velcro wrist splint.  Patient was discharged with Bactrim.  He was advised to follow-up with Dr. Stephenie AcresSoria.  All patient questions were answered.  ____________________________________________  FINAL CLINICAL IMPRESSION(S) / ED DIAGNOSES  Final diagnoses:  Puncture wound of left hand without foreign body, initial encounter      NEW MEDICATIONS STARTED DURING THIS VISIT:  ED Discharge Orders         Ordered    sulfamethoxazole-trimethoprim (BACTRIM DS) 800-160 MG tablet  2 times daily     10/22/18 2004              This chart was dictated using voice recognition software/Dragon. Despite best efforts to proofread, errors can occur which can change the meaning. Any change was purely unintentional.    Gasper LloydWoods, Raney Koeppen M, PA-C 10/22/18 2032    Sharman CheekStafford, Phillip, MD 10/23/18 [id number]

## 2020-03-25 ENCOUNTER — Telehealth: Payer: Self-pay | Admitting: Cardiovascular Disease

## 2020-03-25 NOTE — Telephone Encounter (Signed)
3 attempts to schedule fu appt from recall list.   Deleting recall.   

## 2021-04-10 DIAGNOSIS — Z9841 Cataract extraction status, right eye: Secondary | ICD-10-CM

## 2021-04-10 HISTORY — DX: Cataract extraction status, right eye: Z98.41

## 2021-04-10 HISTORY — PX: CATARACT EXTRACTION W/ INTRAOCULAR LENS  IMPLANT, BILATERAL: SHX1307

## 2021-07-05 ENCOUNTER — Other Ambulatory Visit: Payer: Self-pay | Admitting: Orthopedic Surgery

## 2021-07-05 DIAGNOSIS — M5416 Radiculopathy, lumbar region: Secondary | ICD-10-CM

## 2021-07-09 ENCOUNTER — Ambulatory Visit
Admission: RE | Admit: 2021-07-09 | Discharge: 2021-07-09 | Disposition: A | Discharge status: Home / Self Care | Payer: Medicare PPO | Source: Home / Self Care | Attending: *Deleted | Admitting: *Deleted

## 2021-07-09 ENCOUNTER — Ambulatory Visit
Admission: RE | Admit: 2021-07-09 | Discharge: 2021-07-09 | Disposition: A | Discharge status: Home / Self Care | Payer: Medicare PPO | Source: Ambulatory Visit | Attending: Orthopedic Surgery | Admitting: Orthopedic Surgery

## 2021-07-09 ENCOUNTER — Ambulatory Visit: Payer: Medicare PPO

## 2021-07-09 DIAGNOSIS — M5416 Radiculopathy, lumbar region: Secondary | ICD-10-CM | POA: Diagnosis present

## 2022-06-06 ENCOUNTER — Other Ambulatory Visit: Payer: Self-pay | Admitting: Student

## 2022-06-06 DIAGNOSIS — I739 Peripheral vascular disease, unspecified: Secondary | ICD-10-CM

## 2022-06-06 DIAGNOSIS — M79604 Pain in right leg: Secondary | ICD-10-CM

## 2022-06-09 ENCOUNTER — Ambulatory Visit
Admission: RE | Admit: 2022-06-09 | Discharge: 2022-06-09 | Disposition: A | Discharge status: Home / Self Care | Payer: Medicare PPO | Source: Ambulatory Visit | Attending: Student | Admitting: Student

## 2022-06-09 DIAGNOSIS — I739 Peripheral vascular disease, unspecified: Secondary | ICD-10-CM | POA: Insufficient documentation

## 2022-06-09 DIAGNOSIS — M79604 Pain in right leg: Secondary | ICD-10-CM | POA: Diagnosis present

## 2022-07-12 ENCOUNTER — Other Ambulatory Visit (INDEPENDENT_AMBULATORY_CARE_PROVIDER_SITE_OTHER): Payer: Self-pay | Admitting: Nurse Practitioner

## 2022-07-12 DIAGNOSIS — I739 Peripheral vascular disease, unspecified: Secondary | ICD-10-CM

## 2022-07-25 ENCOUNTER — Ambulatory Visit (INDEPENDENT_AMBULATORY_CARE_PROVIDER_SITE_OTHER): Payer: Self-pay

## 2022-07-25 ENCOUNTER — Ambulatory Visit (INDEPENDENT_AMBULATORY_CARE_PROVIDER_SITE_OTHER): Payer: Medicare PPO | Admitting: Vascular Surgery

## 2022-07-25 ENCOUNTER — Encounter (INDEPENDENT_AMBULATORY_CARE_PROVIDER_SITE_OTHER): Payer: Self-pay | Admitting: Vascular Surgery

## 2022-07-25 VITALS — BP 157/84 | HR 69 | Resp 18 | Ht 74.0 in | Wt 192.6 lb

## 2022-07-25 DIAGNOSIS — E782 Mixed hyperlipidemia: Secondary | ICD-10-CM | POA: Diagnosis not present

## 2022-07-25 DIAGNOSIS — I70221 Atherosclerosis of native arteries of extremities with rest pain, right leg: Secondary | ICD-10-CM | POA: Diagnosis not present

## 2022-07-25 DIAGNOSIS — I70229 Atherosclerosis of native arteries of extremities with rest pain, unspecified extremity: Secondary | ICD-10-CM | POA: Insufficient documentation

## 2022-07-25 DIAGNOSIS — F17200 Nicotine dependence, unspecified, uncomplicated: Secondary | ICD-10-CM | POA: Diagnosis not present

## 2022-07-25 DIAGNOSIS — I739 Peripheral vascular disease, unspecified: Secondary | ICD-10-CM

## 2022-07-25 NOTE — Patient Instructions (Signed)
Endovascular Therapy for Peripheral Vascular Disease, Care After The following information offers guidance on how to care for yourself after your procedure. Your health care provider may also give you more specific instructions. If you have problems or questions, contact your health care provider. What can I expect after the procedure? After the procedure, it is common to have: Pain. Soreness and bruising around your puncture or incision (access site). Fatigue. Follow these instructions at home: Access site care  Follow instructions from your health care provider about how to take care of your access site. Make sure you: Wash your hands with soap and water for at least 20 seconds before and after you change your bandage (dressing). If soap and water are not available, use hand sanitizer. Change your dressing as told by your health care provider. Leave stitches (sutures), skin glue, or adhesive strips in place. If adhesive strip edges start to loosen and curl up, you may trim the loose edges. Do not remove adhesive strips or skin glue completely unless your health care provider tells you to do that. Check your access site every day for signs of infection. Check for: More redness, swelling, or pain. A lump or bump. Fluid or blood. Warmth. Pus or a bad smell. Medicines Take over-the-counter and prescription medicines only as told by your health care provider. You may need to take medicines to prevent blood clots and to lower your cholesterol. If you were prescribed an antibiotic medicine, take it as told by your health care provider. Do not stop taking the antibiotic even if you start to feel better. Driving Do not drive until your health care provider approves. If you were given a sedative during the procedure, it can affect you for several hours. Do not drive or operate machinery until your health care provider says that it is safe. Ask your health care provider if the medicine prescribed to  you requires you to avoid driving or using machinery. Activity Rest as told by your health care provider. Avoid sitting for a long time without moving. Get up to take short walks every 1-2 hours. This is important to improve blood flow and breathing. Ask for help if you feel weak or unsteady. Do not lift anything that is heavier than 10 lb (4.5 kg), or the limit that you are told, until your health care provider says that it is safe. Avoid activity that requires a lot of effort, such as exercise and sports, as told by your health care provider. Avoid sexual activity until your health care provider says it is safe. Follow your exercise plan as told by your health care provider. Return to your normal activities as told by your health care provider. Ask your health care provider what activities are safe for you. Eating and drinking Drink fluids as instructed to help flush out the dye used during the procedure. Follow instructions from your health care provider about eating or drinking restrictions. You may need to eat a diet that is low in salt (sodium) and fat. Avoid drinking alcohol. General instructions Do not take baths, swim, or use a hot tub until your health care provider approves. Ask your health care provider if you may take showers. You may only be allowed to take sponge baths. Do not use any products that contain nicotine or tobacco. These products include cigarettes, chewing tobacco, and vaping devices, such as e-cigarettes. If you need help quitting, ask your health care provider. Keep all follow-up visits. This is important. Contact a health care   provider if: You have a fever. You have severe pain that does not get better with medicine. You have more redness, swelling, or pain around your access site. You have a lump or bump at your access site. Get help right away if:  You have fluid or blood coming from your access site. If this happens, lie down on your back and apply pressure  to the area. You have chest pain. You have problems breathing. You have pain, numbness, or tingling in your legs. You faint. You have any symptoms of a stroke. "BE FAST" is an easy way to remember the main warning signs of a stroke: B - Balance. Signs are dizziness, sudden trouble walking, or loss of balance. E - Eyes. Signs are trouble seeing or a sudden change in vision. F - Face. Signs are sudden weakness or numbness of the face, or the face or eyelid drooping on one side. A - Arms. Signs are weakness or numbness in an arm. This happens suddenly and usually on one side of the body. S - Speech. Signs are sudden trouble speaking, slurred speech, or trouble understanding what people say. T - Time. Time to call emergency services. Write down what time symptoms started. You have other signs of a stroke, such as: A sudden, severe headache with no known cause. Nausea or vomiting. Seizure. These symptoms may represent a serious problem that is an emergency. Do not wait to see if the symptoms will go away. Get medical help right away. Call your local emergency services (911 in the [place] Do not drive yourself to the hospital. Summary After the procedure, it is common to have pain and soreness near your puncture or incision (access site). Check your access site every day for signs of infection, such as redness, swelling, or pain. You may need to take medicines to prevent blood clots and to lower your cholesterol. If you have any signs of a stroke, get help right away. This information is not intended to replace advice given to you by your health care provider. Make sure you discuss any questions you have with your health care provider. Document Revised: 04/21/2021 Document Reviewed: 09/29/2019 Elsevier Patient Education  2023 [employer]  

## 2022-07-25 NOTE — Assessment & Plan Note (Signed)
He was studied with ABIs today which showed markedly reduced right ABI of 0.53 with monophasic waveforms.  The left ABI was 1.04 with triphasic waveforms.  Recommend:  The patient has evidence of severe atherosclerotic changes of both lower extremities with rest pain that is associated with preulcerative changes and impending tissue loss of the right foot.  This represents a limb threatening ischemia and places the patient at the risk for right limb loss.  Patient should undergo angiography of the right lower extremity with the hope for intervention for limb salvage.  The risks and benefits as well as the alternative therapies was discussed in detail with the patient.  All questions were answered.  Patient agrees to proceed with right lower extremity angiography.  The patient will follow up with me in the office after the procedure.

## 2022-07-25 NOTE — H&P (View-Only) (Signed)
  Patient ID: Aaron Knox, male   DOB: 07/31/1955, 67 y.o.   MRN: [id number]  Chief Complaint  Patient presents with   New Patient (Initial Visit)    np. ABI + claudication. worsening right calf pain + leg giving out underneath him + aching pain at night + sometimes foot turning black and blue. referred by shah, hemang    HPI Aaron Knox is a 67 y.o. male.  I am asked to see the patient by Dr. Shah for evaluation of right leg pain and discoloration.  The patient's been having trouble with his right leg now for about a year and a half.  This is all on his right leg.  He says he can walk short distances before the right calf becomes very tight and painful and the lower leg essentially gives out.  The pain is now waking him up at night.  He says the last night he actually had to get up 3 times and put his leg down.  He denies any open wounds or ulceration.  No fevers or chills.  There is no clear cause or inciting event that started the symptoms.  He has cut back on his tobacco but has continued to smoke and understands this may be part of the issue.  He was studied with ABIs today which showed markedly reduced right ABI of 0.53 with monophasic waveforms.  The left ABI was 1.04 with triphasic waveforms.   Past Medical History:  Diagnosis Date   CAD in native artery    a. posterior STEMI 08/2014; b. LHC 5/16 - 99% stenosis in the proximal left circumflex, 40-50% mid left circumflex stenosis and mild nonobstructive disease affecting the LAD and RCA, successful PCI/DES to pLCx, EF 40%   Systolic dysfunction    a. TTE 5/16: EF 45-50%, Gr1DD, inferior HK, aortic sclerosis w/o stenosis    Past Surgical History:  Procedure Laterality Date   CARDIAC CATHETERIZATION N/A 08/09/2014   Procedure: Left Heart Cath and Coronary Angiography;  Surgeon: Muhammad A Arida, MD;  Location: [facility];  Service: Cardiovascular;  Laterality: N/A;   CARDIAC CATHETERIZATION N/A 08/09/2014   Procedure:  Coronary Stent Intervention;  Surgeon: Muhammad A Arida, MD;  Location: [facility];  Service: Cardiovascular;  Laterality: N/A;   CORONARY ANGIOPLASTY     LEFT HEART CATH AND CORONARY ANGIOGRAPHY N/A 01/29/2018   Procedure: LEFT HEART CATH AND CORONARY ANGIOGRAPHY;  Surgeon: End, Christopher, MD;  Location: [facility];  Service: Cardiovascular;  Laterality: N/A;     Family History  Problem Relation Age of Onset   CAD Maternal Grandfather   No bleeding or clotting disorders No aneurysms   Social History   Tobacco Use   Smoking status: Every Day    Packs/day: .25    Types: Cigarettes   Smokeless tobacco: Never  Substance Use Topics   Alcohol use: Yes    Alcohol/week: 3.0 standard drinks of alcohol    Types: 3 Glasses of wine per week   Drug use: No     Allergies  Allergen Reactions   Aspirin Anaphylaxis   Penicillins Anaphylaxis    Has patient had a PCN reaction causing immediate rash, facial/tongue/throat swelling, SOB or lightheadedness with hypotension: Yes Has patient had a PCN reaction causing severe rash involving mucus membranes or skin necrosis: No Has patient had a PCN reaction that required hospitalization: Yes Has patient had a PCN reaction occurring within the last 10 years: No If   all of the above answers are "NO", then may proceed with Cephalosporin use.    Current Outpatient Medications  Medication Sig Dispense Refill   celecoxib (CELEBREX) 100 MG capsule Take 100 mg by mouth daily.     atorvastatin (LIPITOR) 80 MG tablet Take 1 tablet (80 mg total) by mouth daily at 6 PM. 90 tablet 3   No current facility-administered medications for this visit.      REVIEW OF SYSTEMS (Negative unless checked)  Constitutional: []Weight loss  []Fever  []Chills Cardiac: []Chest pain   []Chest pressure   []Palpitations   []Shortness of breath when laying flat   []Shortness of breath at rest   []Shortness of breath with exertion. Vascular:  [x]Pain in  legs with walking   [x]Pain in legs at rest   []Pain in legs when laying flat   [x]Claudication   []Pain in feet when walking  []Pain in feet at rest  [x]Pain in feet when laying flat   []History of DVT   []Phlebitis   []Swelling in legs   []Varicose veins   []Non-healing ulcers Pulmonary:   []Uses home oxygen   []Productive cough   []Hemoptysis   []Wheeze  []COPD   []Asthma Neurologic:  []Dizziness  []Blackouts   []Seizures   []History of stroke   []History of TIA  []Aphasia   []Temporary blindness   []Dysphagia   []Weakness or numbness in arms   []Weakness or numbness in legs Musculoskeletal:  []Arthritis   []Joint swelling   [x]Joint pain   []Low back pain Hematologic:  []Easy bruising  []Easy bleeding   []Hypercoagulable state   []Anemic  []Hepatitis Gastrointestinal:  []Blood in stool   []Vomiting blood  []Gastroesophageal reflux/heartburn   []Abdominal pain Genitourinary:  []Chronic kidney disease   []Difficult urination  []Frequent urination  []Burning with urination   []Hematuria Skin:  []Rashes   []Ulcers   []Wounds Psychological:  []History of anxiety   [] History of major depression.    Physical Exam BP (!) 157/84 (BP Location: Left Arm)   Pulse 69   Resp 18   Ht 6' 2" (1.88 m)   Wt 192 lb 9.6 oz (87.4 kg)   BMI 24.73 kg/m  Gen:  WD/WN, NAD. Appears younger than stated age.  Head: [place]/AT, No temporalis wasting. Ear/Nose/Throat: Hearing grossly intact, nares w/o erythema or drainage, oropharynx w/o Erythema/Exudate Eyes: Conjunctiva clear, sclera non-icteric  Neck: trachea midline.  No JVD.  Pulmonary:  Good air movement, respirations not labored, no use of accessory muscles  Cardiac: RRR, no JVD Vascular:  Vessel Right Left  Radial Palpable Palpable                          PT NP 2+  DP NP 2+   Gastrointestinal:. No masses, surgical incisions, or scars. Musculoskeletal: M/S 5/5 throughout.  No deformity or atrophy. no edema. Neurologic: Sensation grossly intact in  extremities.  Symmetrical.  Speech is fluent. Motor exam as listed above. Psychiatric: Judgment intact, Mood & affect appropriate for pt's clinical situation. Dermatologic: No rashes or ulcers noted.  No cellulitis or open wounds.    Radiology No results found.  Labs No results found for this or any previous visit (from the past 2160 hour(s)).  Assessment/Plan:  Atherosclerosis of native arteries of extremity with rest pain He was studied with ABIs today which showed markedly reduced right ABI of 0.53 with monophasic waveforms.  The left ABI was 1.04 with triphasic   waveforms.  Recommend:  The patient has evidence of severe atherosclerotic changes of both lower extremities with rest pain that is associated with preulcerative changes and impending tissue loss of the right foot.  This represents a limb threatening ischemia and places the patient at the risk for right limb loss.  Patient should undergo angiography of the right lower extremity with the hope for intervention for limb salvage.  The risks and benefits as well as the alternative therapies was discussed in detail with the patient.  All questions were answered.  Patient agrees to proceed with right lower extremity angiography.  The patient will follow up with me in the office after the procedure.      Hyperlipidemia lipid control important in reducing the progression of atherosclerotic disease. Continue statin therapy   Smoker We had a discussion for approximately 3 minutes regarding the absolute need for smoking cessation due to the deleterious nature of tobacco on the vascular system. We discussed the tobacco use would diminish patency of any intervention, and likely significantly worsen progressio of disease. We discussed multiple agents for quitting including replacement therapy or medications to reduce cravings such as Chantix. The patient voices their understanding of the importance of smoking cessation.      Lamoine Magallon 07/25/2022, 12:20 PM   This note was created with Dragon medical transcription system.  Any errors from dictation are unintentional.   

## 2022-07-25 NOTE — Assessment & Plan Note (Signed)
lipid control important in reducing the progression of atherosclerotic disease. Continue statin therapy  

## 2022-07-25 NOTE — Progress Notes (Signed)
Patient ID: Aaron Knox, male   DOB: 12/31/1955, 67 y.o.   MRN: [id number]  Chief Complaint  Patient presents with   New Patient (Initial Visit)    np. ABI + claudication. worsening right calf pain + leg giving out underneath him + aching pain at night + sometimes foot turning black and blue. referred by shah, hemang    HPI Aaron Knox is a 67 y.o. male.  I am asked to see the patient by Dr. Sherryll Burger for evaluation of right leg pain and discoloration.  The patient's been having trouble with his right leg now for about a year and a half.  This is all on his right leg.  He says he can walk short distances before the right calf becomes very tight and painful and the lower leg essentially gives out.  The pain is now waking him up at night.  He says the last night he actually had to get up 3 times and put his leg down.  He denies any open wounds or ulceration.  No fevers or chills.  There is no clear cause or inciting event that started the symptoms.  He has cut back on his tobacco but has continued to smoke and understands this may be part of the issue.  He was studied with ABIs today which showed markedly reduced right ABI of 0.53 with monophasic waveforms.  The left ABI was 1.04 with triphasic waveforms.   Past Medical History:  Diagnosis Date   CAD in native artery    a. posterior STEMI 08/2014; b. LHC 5/16 - 99% stenosis in the proximal left circumflex, 40-50% mid left circumflex stenosis and mild nonobstructive disease affecting the LAD and RCA, successful PCI/DES to pLCx, EF 40%   Systolic dysfunction    a. TTE 5/16: EF 45-50%, Gr1DD, inferior HK, aortic sclerosis w/o stenosis    Past Surgical History:  Procedure Laterality Date   CARDIAC CATHETERIZATION N/A 08/09/2014   Procedure: Left Heart Cath and Coronary Angiography;  Surgeon: Iran Ouch, MD;  Location: [facility];  Service: Cardiovascular;  Laterality: N/A;   CARDIAC CATHETERIZATION N/A 08/09/2014   Procedure:  Coronary Stent Intervention;  Surgeon: Iran Ouch, MD;  Location: [facility];  Service: Cardiovascular;  Laterality: N/A;   CORONARY ANGIOPLASTY     LEFT HEART CATH AND CORONARY ANGIOGRAPHY N/A 01/29/2018   Procedure: LEFT HEART CATH AND CORONARY ANGIOGRAPHY;  Surgeon: Yvonne Kendall, MD;  Location: [facility];  Service: Cardiovascular;  Laterality: N/A;     Family History  Problem Relation Age of Onset   CAD Maternal Grandfather   No bleeding or clotting disorders No aneurysms   Social History   Tobacco Use   Smoking status: Every Day    Packs/day: .25    Types: Cigarettes   Smokeless tobacco: Never  Substance Use Topics   Alcohol use: Yes    Alcohol/week: 3.0 standard drinks of alcohol    Types: 3 Glasses of wine per week   Drug use: No     Allergies  Allergen Reactions   Aspirin Anaphylaxis   Penicillins Anaphylaxis    Has patient had a PCN reaction causing immediate rash, facial/tongue/throat swelling, SOB or lightheadedness with hypotension: Yes Has patient had a PCN reaction causing severe rash involving mucus membranes or skin necrosis: No Has patient had a PCN reaction that required hospitalization: Yes Has patient had a PCN reaction occurring within the last 10 years: No If  all of the above answers are "NO", then may proceed with Cephalosporin use.    Current Outpatient Medications  Medication Sig Dispense Refill   celecoxib (CELEBREX) 100 MG capsule Take 100 mg by mouth daily.     atorvastatin (LIPITOR) 80 MG tablet Take 1 tablet (80 mg total) by mouth daily at 6 PM. 90 tablet 3   No current facility-administered medications for this visit.      REVIEW OF SYSTEMS (Negative unless checked)  Constitutional: [] Weight loss  [] Fever  [] Chills Cardiac: [] Chest pain   [] Chest pressure   [] Palpitations   [] Shortness of breath when laying flat   [] Shortness of breath at rest   [] Shortness of breath with exertion. Vascular:  [x] Pain in  legs with walking   [x] Pain in legs at rest   [] Pain in legs when laying flat   [x] Claudication   [] Pain in feet when walking  [] Pain in feet at rest  [x] Pain in feet when laying flat   [] History of DVT   [] Phlebitis   [] Swelling in legs   [] Varicose veins   [] Non-healing ulcers Pulmonary:   [] Uses home oxygen   [] Productive cough   [] Hemoptysis   [] Wheeze  [] COPD   [] Asthma Neurologic:  [] Dizziness  [] Blackouts   [] Seizures   [] History of stroke   [] History of TIA  [] Aphasia   [] Temporary blindness   [] Dysphagia   [] Weakness or numbness in arms   [] Weakness or numbness in legs Musculoskeletal:  [] Arthritis   [] Joint swelling   [x] Joint pain   [] Low back pain Hematologic:  [] Easy bruising  [] Easy bleeding   [] Hypercoagulable state   [] Anemic  [] Hepatitis Gastrointestinal:  [] Blood in stool   [] Vomiting blood  [] Gastroesophageal reflux/heartburn   [] Abdominal pain Genitourinary:  [] Chronic kidney disease   [] Difficult urination  [] Frequent urination  [] Burning with urination   [] Hematuria Skin:  [] Rashes   [] Ulcers   [] Wounds Psychological:  [] History of anxiety   []  History of major depression.    Physical Exam BP (!) 157/84 (BP Location: Left Arm)   Pulse 69   Resp 18   Ht 6\' 2"  (1.88 m)   Wt 192 lb 9.6 oz (87.4 kg)   BMI 24.73 kg/m  Gen:  WD/WN, NAD. Appears younger than stated age.  Head: [place]/AT, No temporalis wasting. Ear/Nose/Throat: Hearing grossly intact, nares w/o erythema or drainage, oropharynx w/o Erythema/Exudate Eyes: Conjunctiva clear, sclera non-icteric  Neck: trachea midline.  No JVD.  Pulmonary:  Good air movement, respirations not labored, no use of accessory muscles  Cardiac: RRR, no JVD Vascular:  Vessel Right Left  Radial Palpable Palpable                          PT NP 2+  DP NP 2+   Gastrointestinal:. No masses, surgical incisions, or scars. Musculoskeletal: M/S 5/5 throughout.  No deformity or atrophy. no edema. Neurologic: Sensation grossly intact in  extremities.  Symmetrical.  Speech is fluent. Motor exam as listed above. Psychiatric: Judgment intact, Mood & affect appropriate for pt's clinical situation. Dermatologic: No rashes or ulcers noted.  No cellulitis or open wounds.    Radiology No results found.  Labs No results found for this or any previous visit (from the past 2160 hour(s)).  Assessment/Plan:  Atherosclerosis of native arteries of extremity with rest pain He was studied with ABIs today which showed markedly reduced right ABI of 0.53 with monophasic waveforms.  The left ABI was 1.04 with triphasic  waveforms.  Recommend:  The patient has evidence of severe atherosclerotic changes of both lower extremities with rest pain that is associated with preulcerative changes and impending tissue loss of the right foot.  This represents a limb threatening ischemia and places the patient at the risk for right limb loss.  Patient should undergo angiography of the right lower extremity with the hope for intervention for limb salvage.  The risks and benefits as well as the alternative therapies was discussed in detail with the patient.  All questions were answered.  Patient agrees to proceed with right lower extremity angiography.  The patient will follow up with me in the office after the procedure.      Hyperlipidemia lipid control important in reducing the progression of atherosclerotic disease. Continue statin therapy   Smoker We had a discussion for approximately 3 minutes regarding the absolute need for smoking cessation due to the deleterious nature of tobacco on the vascular system. We discussed the tobacco use would diminish patency of any intervention, and likely significantly worsen progressio of disease. We discussed multiple agents for quitting including replacement therapy or medications to reduce cravings such as Chantix. The patient voices their understanding of the importance of smoking cessation.      Festus Barren 07/25/2022, 12:20 PM   This note was created with Dragon medical transcription system.  Any errors from dictation are unintentional.

## 2022-07-25 NOTE — Assessment & Plan Note (Signed)

## 2022-07-26 LAB — VAS US ABI WITH/WO TBI
Left ABI: 1.04
Right ABI: 0.53

## 2022-07-31 ENCOUNTER — Telehealth (INDEPENDENT_AMBULATORY_CARE_PROVIDER_SITE_OTHER): Payer: Self-pay | Admitting: Vascular Surgery

## 2022-07-31 NOTE — Telephone Encounter (Signed)
Patient called and has a procedure on Monday 08/07/22 but did not get a letter with instructions.  Please advise as to what medications he should take or not.  Please advise

## 2022-08-01 ENCOUNTER — Encounter (INDEPENDENT_AMBULATORY_CARE_PROVIDER_SITE_OTHER): Payer: Self-pay

## 2022-08-01 NOTE — Telephone Encounter (Signed)
Patient notified with pre-procedure instructions and letter will be mailed out today

## 2022-08-07 ENCOUNTER — Other Ambulatory Visit: Payer: Self-pay

## 2022-08-07 ENCOUNTER — Encounter
Admission: RE | Disposition: A | Discharge status: Home / Self Care | Payer: Self-pay | Source: Home / Self Care | Attending: Vascular Surgery

## 2022-08-07 ENCOUNTER — Encounter: Payer: Self-pay | Admitting: Vascular Surgery

## 2022-08-07 ENCOUNTER — Ambulatory Visit
Admission: RE | Admit: 2022-08-07 | Discharge: 2022-08-07 | Disposition: A | Discharge status: Home / Self Care | Payer: Medicare PPO | Attending: Vascular Surgery | Admitting: Vascular Surgery

## 2022-08-07 DIAGNOSIS — I70202 Unspecified atherosclerosis of native arteries of extremities, left leg: Secondary | ICD-10-CM

## 2022-08-07 DIAGNOSIS — I77811 Abdominal aortic ectasia: Secondary | ICD-10-CM | POA: Diagnosis not present

## 2022-08-07 DIAGNOSIS — I70211 Atherosclerosis of native arteries of extremities with intermittent claudication, right leg: Secondary | ICD-10-CM

## 2022-08-07 DIAGNOSIS — E785 Hyperlipidemia, unspecified: Secondary | ICD-10-CM | POA: Insufficient documentation

## 2022-08-07 DIAGNOSIS — I700 Atherosclerosis of aorta: Secondary | ICD-10-CM

## 2022-08-07 DIAGNOSIS — F17210 Nicotine dependence, cigarettes, uncomplicated: Secondary | ICD-10-CM | POA: Diagnosis not present

## 2022-08-07 DIAGNOSIS — I70221 Atherosclerosis of native arteries of extremities with rest pain, right leg: Secondary | ICD-10-CM | POA: Diagnosis present

## 2022-08-07 DIAGNOSIS — I7789 Other specified disorders of arteries and arterioles: Secondary | ICD-10-CM

## 2022-08-07 DIAGNOSIS — I70229 Atherosclerosis of native arteries of extremities with rest pain, unspecified extremity: Secondary | ICD-10-CM

## 2022-08-07 HISTORY — PX: LOWER EXTREMITY ANGIOGRAPHY: CATH118251

## 2022-08-07 LAB — CREATININE, SERUM
Creatinine, Ser: 0.87 mg/dL (ref 0.61–1.24)
GFR, Estimated: >60 mL/min (ref >60)

## 2022-08-07 LAB — BUN: BUN: 13 mg/dL (ref 8–23)

## 2022-08-07 SURGERY — LOWER EXTREMITY ANGIOGRAPHY
Anesthesia: Moderate Sedation | Site: Leg Lower | Laterality: Right

## 2022-08-07 MED ORDER — ONDANSETRON HCL 4 MG/2ML IJ SOLN: 4.0000 mg | Freq: Four times a day (QID) | INTRAMUSCULAR | Status: DC | PRN

## 2022-08-07 MED ORDER — HYDROMORPHONE HCL 1 MG/ML IJ SOLN: 1.0000 mg | Freq: Once | INTRAMUSCULAR | Status: DC | PRN

## 2022-08-07 MED ORDER — METHYLPREDNISOLONE SODIUM SUCC 125 MG IJ SOLR: 125.0000 mg | Freq: Once | INTRAMUSCULAR | Status: DC | PRN

## 2022-08-07 MED ORDER — HEPARIN SODIUM (PORCINE) 1000 UNIT/ML IJ SOLN
INTRAMUSCULAR | Status: AC
  Filled 2022-08-07: qty 10

## 2022-08-07 MED ORDER — FENTANYL CITRATE (PF) 100 MCG/2ML IJ SOLN
INTRAMUSCULAR | Status: DC | PRN
  Administered 2022-08-07: 25 ug via INTRAVENOUS
  Administered 2022-08-07: 50 ug via INTRAVENOUS

## 2022-08-07 MED ORDER — SODIUM CHLORIDE 0.9 % IV SOLN: INTRAVENOUS | Status: DC

## 2022-08-07 MED ORDER — MIDAZOLAM HCL 2 MG/2ML IJ SOLN
INTRAMUSCULAR | Status: DC | PRN
  Administered 2022-08-07: 2 mg via INTRAVENOUS
  Administered 2022-08-07: 1 mg via INTRAVENOUS

## 2022-08-07 MED ORDER — FENTANYL CITRATE (PF) 100 MCG/2ML IJ SOLN
INTRAMUSCULAR | Status: AC
  Filled 2022-08-07: qty 2

## 2022-08-07 MED ORDER — VANCOMYCIN HCL IN DEXTROSE 1-5 GM/200ML-% IV SOLN
1000.0000 mg | INTRAVENOUS | Status: AC
  Administered 2022-08-07: 1000 mg via INTRAVENOUS

## 2022-08-07 MED ORDER — MIDAZOLAM HCL 2 MG/ML PO SYRP: 8.0000 mg | ORAL_SOLUTION | Freq: Once | ORAL | Status: DC | PRN

## 2022-08-07 MED ORDER — MIDAZOLAM HCL 5 MG/5ML IJ SOLN
INTRAMUSCULAR | Status: AC
  Filled 2022-08-07: qty 5

## 2022-08-07 MED ORDER — DIPHENHYDRAMINE HCL 50 MG/ML IJ SOLN: 50.0000 mg | Freq: Once | INTRAMUSCULAR | Status: DC | PRN

## 2022-08-07 MED ORDER — FAMOTIDINE 20 MG PO TABS: 40.0000 mg | ORAL_TABLET | Freq: Once | ORAL | Status: DC | PRN

## 2022-08-07 MED ORDER — VANCOMYCIN HCL IN DEXTROSE 1-5 GM/200ML-% IV SOLN
INTRAVENOUS | Status: AC
  Filled 2022-08-07: qty 200

## 2022-08-07 SURGICAL SUPPLY — 9 items
CATH ANGIO 5F PIGTAIL 65CM (CATHETERS) IMPLANT
COVER PROBE ULTRASOUND 5X96 (MISCELLANEOUS) IMPLANT
DEVICE STARCLOSE SE CLOSURE (Vascular Products) IMPLANT
GOWN STRL REUS W/TWL 2XL LVL3 (GOWN DISPOSABLE) IMPLANT
PACK ANGIOGRAPHY (CUSTOM PROCEDURE TRAY) ×1 IMPLANT
SHEATH BRITE TIP 5FRX11 (SHEATH) IMPLANT
SYR MEDRAD MARK 7 150ML (SYRINGE) IMPLANT
TUBING CONTRAST HIGH PRESS 72 (TUBING) IMPLANT
WIRE GUIDERIGHT .035X150 (WIRE) IMPLANT

## 2022-08-07 NOTE — Op Note (Signed)
[facility]  Percutaneous Study/Intervention Procedural Note   Date of Surgery: 08/07/2022  Surgeon(s):Milani Lowenstein    Assistants:none  Pre-operative Diagnosis: PAD with claudication RLE  Post-operative diagnosis:  Same  Procedure(s) Performed:             1.  Ultrasound guidance for vascular access left femoral artery             2.  Catheter placement into right common iliac artery from left femoral approach             3.  Aortogram and selective right lower extremity angiogram             4.  StarClose closure device left femoral artery  EBL: 5 cc  Contrast: 45 cc  Fluoro Time: 0.7 minutes  Moderate Conscious Sedation Time: approximately 26 minutes using 3 mg of Versed and 75 mcg of Fentanyl              Indications:  Patient is a 67 y.o.male with disabling claudication symptoms in the right lower extremity. The patient has noninvasive study showing markedly reduced right ABI. The patient is brought in for angiography for further evaluation and potential treatment.  Risks and benefits are discussed and informed consent is obtained.   Procedure:  The patient was identified and appropriate procedural time out was performed.  The patient was then placed supine on the table and prepped and draped in the usual sterile fashion. Moderate conscious sedation was administered during a face to face encounter with the patient throughout the procedure with my supervision of the RN administering medicines and monitoring the patient's vital signs, pulse oximetry, telemetry and mental status throughout from the start of the procedure until the patient was taken to the recovery room. Ultrasound was used to evaluate the left common femoral artery.  It was patent .  A digital ultrasound image was acquired.  A Seldinger needle was used to access the left common femoral artery under direct ultrasound guidance and a permanent image was performed.  A 0.035 J wire was advanced without  resistance and a 5Fr sheath was placed.  Pigtail catheter was placed into the aorta and an AP aortogram was performed. This demonstrated normal renal arteries.  The aorta was mildly ectatic and there was a chunk of calcium in the distal portion although it was not creating significant stenosis.  The left common iliac artery was ectatic and possibly mildly aneurysmal.  The left external iliac artery was mildly diseased.  The right common iliac artery was patent initially but occluded at the level of the hypogastric artery and the external iliac artery was occluded.  There was occlusion down through the right common femoral artery. I then crossed the aortic bifurcation and advanced to the right common iliac artery to image the right lower extremity.  Selective right lower extremity angiogram was then performed. This demonstrated occlusion of the right common femoral artery.  There were collaterals feeding the profunda femoris artery which appeared to have some disease at its origin but was a larger vessel.  The SFA was occluded over the first 3 to 4 cm and then reconstituted through some collaterals.  The remainder of the SFA was relatively small due to the underperfusion but did not appear significantly diseased.  There was a normal tibial trifurcation and what appeared to be three-vessel runoff distally although the flow was sluggish due to the inflow lesion. It was felt that it was in the patient's best  interest to have a hybrid procedure with a very extensive femoral endarterectomy in the common and superficial femoral arteries and then endovascular therapy to the iliac disease.  This will be done at a later date in the operating room.  I elected to terminate the procedure. The sheath was removed and StarClose closure device was deployed in the left femoral artery with excellent hemostatic result. The patient was taken to the recovery room in stable condition having tolerated the procedure well.  Findings:                Aortogram:  This demonstrated normal renal arteries.  The aorta was mildly ectatic and there was a chunk of calcium in the distal portion although it was not creating significant stenosis.  The left common iliac artery was ectatic and possibly mildly aneurysmal.  The left external iliac artery was mildly diseased.  The right common iliac artery was patent initially but occluded at the level of the hypogastric artery and the external iliac artery was occluded.  There was occlusion down through the right common femoral artery.             Right Lower Extremity:  This demonstrated occlusion of the right common femoral artery.  There were collaterals feeding the profunda femoris artery which appeared to have some disease at its origin but was a larger vessel.  The SFA was occluded over the first 3 to 4 cm and then reconstituted through some collaterals.  The remainder of the SFA was relatively small due to the underperfusion but did not appear significantly diseased.  There was a normal tibial trifurcation and what appeared to be three-vessel runoff distally although the flow was sluggish due to the inflow lesion   Disposition: Patient was taken to the recovery room in stable condition having tolerated the procedure well.  Complications: None  Festus Barren 08/07/2022 11:13 AM   This note was created with Dragon Medical transcription system. Any errors in dictation are purely unintentional.

## 2022-08-07 NOTE — Interval H&P Note (Signed)
History and Physical Interval Note:  08/07/2022 9:15 AM  Aaron Knox  has presented today for surgery, with the diagnosis of RLE Angio   BARD   ASO w rest pain.  The various methods of treatment have been discussed with the patient and family. After consideration of risks, benefits and other options for treatment, the patient has consented to  Procedure(s): Lower Extremity Angiography (Right) as a surgical intervention.  The patient's history has been reviewed, patient examined, no change in status, stable for surgery.  I have reviewed the patient's chart and labs.  Questions were answered to the patient's satisfaction.     Festus Barren

## 2022-08-08 ENCOUNTER — Encounter: Payer: Self-pay | Admitting: Vascular Surgery

## 2022-08-08 ENCOUNTER — Telehealth: Payer: Self-pay | Admitting: Cardiovascular Disease

## 2022-08-08 NOTE — Telephone Encounter (Signed)
   Name: Aaron Knox [facility]  DOB: March 29, 1956  MRN: [id number]  Primary Cardiologist: None  Chart reviewed as part of pre-operative protocol coverage. Because of [name]'s past medical history and time since last visit, he will require a follow-up in-office visit in order to better assess preoperative cardiovascular risk.  Pre-op covering staff: - Please schedule appointment and call patient to inform them. If patient already had an upcoming appointment within acceptable timeframe, please add "pre-op clearance" to the appointment notes so provider is aware. - Please contact requesting surgeon's office via preferred method (i.e, phone, fax) to inform them of need for appointment prior to surgery.   Napoleon Form, Leodis Rains, NP  08/08/2022, 10:50 AM

## 2022-08-08 NOTE — Telephone Encounter (Signed)
   Pre-operative Risk Assessment    Patient Name: Aaron Knox  DOB: 08-27-1955 MRN: [id number]{      Request for Surgical Clearance    Procedure:   RIGHT FEMORAL ENDARTERECTOMY OF RIGHT ILIAC STENTS  Date of Surgery:  Clearance TBD                               Surgeon:  DR Festus Barren Surgeon's Group or Practice Name:  [facility] Phone number:  [phone] Fax number:  [phone]  Type of Clearance Requested:   - Medical    Type of Anesthesia:  Not Indicated   Additional requests/questions:    Signed, Dalia Heading   08/08/2022, 9:04 AM

## 2022-08-08 NOTE — Telephone Encounter (Signed)
Contacted the patient and scheduled a follow up with app in [place]. Patient agreeable and voiced understanding.

## 2022-08-22 ENCOUNTER — Ambulatory Visit: Payer: Medicare PPO | Attending: Medical | Admitting: Medical

## 2022-08-22 ENCOUNTER — Other Ambulatory Visit
Admission: RE | Admit: 2022-08-22 | Discharge: 2022-08-22 | Disposition: A | Discharge status: Home / Self Care | Payer: Medicare PPO | Source: Ambulatory Visit | Attending: Medical | Admitting: Medical

## 2022-08-22 ENCOUNTER — Encounter: Payer: Self-pay | Admitting: Medical

## 2022-08-22 VITALS — BP 136/82 | HR 74 | Ht 74.0 in | Wt 196.6 lb

## 2022-08-22 DIAGNOSIS — I2510 Atherosclerotic heart disease of native coronary artery without angina pectoris: Secondary | ICD-10-CM | POA: Diagnosis not present

## 2022-08-22 DIAGNOSIS — Z87891 Personal history of nicotine dependence: Secondary | ICD-10-CM

## 2022-08-22 DIAGNOSIS — E7849 Other hyperlipidemia: Secondary | ICD-10-CM | POA: Diagnosis not present

## 2022-08-22 DIAGNOSIS — I10 Essential (primary) hypertension: Secondary | ICD-10-CM | POA: Diagnosis not present

## 2022-08-22 DIAGNOSIS — Z79899 Other long term (current) drug therapy: Secondary | ICD-10-CM

## 2022-08-22 DIAGNOSIS — Z01810 Encounter for preprocedural cardiovascular examination: Secondary | ICD-10-CM

## 2022-08-22 LAB — COMPREHENSIVE METABOLIC PANEL
ALT: 29 U/L (ref 0–44)
AST: 27 U/L (ref 15–41)
Albumin: 4.5 g/dL (ref 3.5–5.0)
Alkaline Phosphatase: 79 U/L (ref 38–126)
Anion gap: 7 (ref 5–15)
BUN: 12 mg/dL (ref 8–23)
CO2: 26 mmol/L (ref 22–32)
Calcium: 9.3 mg/dL (ref 8.9–10.3)
Chloride: 104 mmol/L (ref 98–111)
Creatinine, Ser: 0.85 mg/dL (ref 0.61–1.24)
GFR, Estimated: >60 mL/min (ref >60)
Glucose, Bld: 100 mg/dL — ABNORMAL HIGH (ref 70–99)
Potassium: 3.8 mmol/L (ref 3.5–5.1)
Sodium: 137 mmol/L (ref 135–145)
Total Bilirubin: 0.8 mg/dL (ref 0.3–1.2)
Total Protein: 7.5 g/dL (ref 6.5–8.1)

## 2022-08-22 LAB — CBC
HCT: 41.5 % (ref 39.0–52.0)
Hemoglobin: 13.7 g/dL (ref 13.0–17.0)
MCH: 30.7 pg (ref 26.0–34.0)
MCHC: 33 g/dL (ref 30.0–36.0)
MCV: 93 fL (ref 80.0–100.0)
Platelets: 189 10*3/uL (ref 150–400)
RBC: 4.46 MIL/uL (ref 4.22–5.81)
RDW: 12.4 % (ref 11.5–15.5)
WBC: 4.8 10*3/uL (ref 4.0–10.5)
nRBC: 0 % (ref 0.0–0.2)

## 2022-08-22 LAB — LIPID PANEL
Cholesterol: 195 mg/dL (ref 0–200)
HDL: 29 mg/dL — ABNORMAL LOW (ref >40)
LDL Cholesterol: 119 mg/dL — ABNORMAL HIGH (ref 0–99)
Total CHOL/HDL Ratio: 6.7 RATIO
Triglycerides: 237 mg/dL — ABNORMAL HIGH (ref <150)
VLDL: 47 mg/dL — ABNORMAL HIGH (ref 0–40)

## 2022-08-22 MED ORDER — ATORVASTATIN CALCIUM 80 MG PO TABS: 80.0000 mg | ORAL_TABLET | Freq: Every day | ORAL | 3 refills | Status: DC

## 2022-08-22 NOTE — Patient Instructions (Signed)
Medication Instructions:  Your physician recommends the following medication changes.  RESTART: Atorvastatin (Lipitor) 80 mg by mouth daily  *If you need a refill on your cardiac medications before your next appointment, please call your pharmacy*   Lab Work: Your provider would like for you to have following labs drawn: (Lipid, CMP, CBC).   Please go to the [facility] entrance and check in at the front desk.  You do not need an appointment.  They are open from 7am-6 pm.  s.   Testing/Procedures: None ordered today   Follow-Up: At [facility], you and your health needs are our priority.  As part of our continuing mission to provide you with exceptional heart care, we have created designated Provider Care Teams.  These Care Teams include your primary Cardiologist (physician) and Advanced Practice Providers (APPs -  Physician Assistants and Nurse Practitioners) who all work together to provide you with the care you need, when you need it.  We recommend signing up for the patient portal called "MyChart".  Sign up information is provided on this After Visit Summary.  MyChart is used to connect with patients for Virtual Visits (Telemedicine).  Patients are able to view lab/test results, encounter notes, upcoming appointments, etc.  Non-urgent messages can be sent to your provider as well.   To learn more about what you can do with MyChart, go to [email or web address].    Your next appointment:   3 month(s)  Provider:   You may see Julien Nordmann, MD or one of the following Advanced Practice Providers on your designated Care Team:   Nicolasa Ducking, NP Eula Listen, PA-C Cadence Fransico Michael, PA-C Charlsie Quest, NP

## 2022-08-22 NOTE — Progress Notes (Signed)
Cardiology Office Note:    Date:  08/22/2022   ID:  Aaron Knox, DOB 06/18/1955, MRN [id number]  PCP:  Patient, No Pcp Per  [facility] Cardiologist:  Julien Nordmann, MD  [facility] Electrophysiologist:  None   Referring MD: No ref. provider found   Chief Complaint: Overdue follow-up  History of Present Illness:    Aaron Knox is a 67 y.o. male with a hx of coronary artery disease status post PCI/DES to proximal left circumflex, hyperlipidemia, tobacco use, allergy to aspirin, presents for overdue follow-up.  Patient has a history of CAD with STEMI in May 2016 with PCI and DES placement to the proximal left circumflex.  Cardiac cath in 2019 showed significant single-vessel CAD with 50% proximal and 90% distal RCA; RCA supplies relatively small PDA and PL branches.  Mild to moderate, nonobstructive CAD involving the left coronary artery with widely patent stent in the proximal left circumflex.  Medical therapy was recommended and Imdur was added.  Refractory chest pain, can consider PCI to the RCA.  Echo showed LVEF 55 to 60%, grade 1 diastolic dysfunction.  Today, the patient is here for preop cardiac evaluation prior to right femoral endarterectomy of right iliac stents.  His right leg goes numb and aches like a toothache.  Despite pain patient remains active and is still working.  Overall, patient is stable from a cardiac perspective. He reports occasional shortness of breath on severe exertion. He denies chest pain. No lower leg edema, orthopnea or pnd. He is not taking any medications. He quit smoking, but Vapes now. He no longer eats canned food.  Past Medical History:  Diagnosis Date   CAD in native artery    a. posterior STEMI 08/2014; b. LHC 5/16 - 99% stenosis in the proximal left circumflex, 40-50% mid left circumflex stenosis and mild nonobstructive disease affecting the LAD and RCA, successful PCI/DES to pLCx, EF 40%   Systolic dysfunction    a. TTE 5/16: EF  45-50%, Gr1DD, inferior HK, aortic sclerosis w/o stenosis    Past Surgical History:  Procedure Laterality Date   CARDIAC CATHETERIZATION N/A 08/09/2014   Procedure: Left Heart Cath and Coronary Angiography;  Surgeon: Iran Ouch, MD;  Location: [facility];  Service: Cardiovascular;  Laterality: N/A;   CARDIAC CATHETERIZATION N/A 08/09/2014   Procedure: Coronary Stent Intervention;  Surgeon: Iran Ouch, MD;  Location: [facility];  Service: Cardiovascular;  Laterality: N/A;   CORONARY ANGIOPLASTY     LEFT HEART CATH AND CORONARY ANGIOGRAPHY N/A 01/29/2018   Procedure: LEFT HEART CATH AND CORONARY ANGIOGRAPHY;  Surgeon: Yvonne Kendall, MD;  Location: [facility] INVASIVE CV LAB;  Service: Cardiovascular;  Laterality: N/A;   LOWER EXTREMITY ANGIOGRAPHY Right 08/07/2022   Procedure: Lower Extremity Angiography;  Surgeon: Annice Needy, MD;  Location: [facility] INVASIVE CV LAB;  Service: Cardiovascular;  Laterality: Right;   nerve reconstruction Left 1980   hand    Current Medications: No outpatient medications have been marked as taking for the 08/22/22 encounter (Office Visit) with Fransico Michael, Zahra Peffley H, PA-C.     Allergies:   Aspirin and Penicillins   Social History   Socioeconomic History   Marital status: Single    Spouse name: Not on file   Number of children: Not on file   Years of education: Not on file   Highest education level: Not on file  Occupational History   Not on file  Tobacco Use   Smoking status: Every Day  Packs/day: .25    Types: Cigarettes   Smokeless tobacco: Never  Substance and Sexual Activity   Alcohol use: Yes    Alcohol/week: 3.0 standard drinks of alcohol    Types: 3 Glasses of wine per week   Drug use: No   Sexual activity: Not on file  Other Topics Concern   Not on file  Social History Narrative   Not on file   Social Determinants of Health   Financial Resource Strain: Not on file  Food Insecurity: Not on file  Transportation  Needs: Not on file  Physical Activity: Not on file  Stress: Not on file  Social Connections: Not on file     Family History: The patient's family history includes CAD in his maternal grandfather.  ROS:   Please see the history of present illness.     All other systems reviewed and are negative.  EKGs/Labs/Other Studies Reviewed:    The following studies were reviewed today:  LHC 01/2018 Conclusions: Significant single-vessel coronary artery disease, with 50% proximal and 80% distal RCA stenoses; RCA supplies relatively small PDA and PL branches. Mild to moderate, non-obstructive CAD involving the left coronary artery with widely patent stent in the proximal LCx. Normal left ventricular filling pressure.   Recommendations: Medical therapy of single-vessel coronary artery disease.  I add isosorbide mononitrate 30 mg daily and continue metoprolol tartrate 25 mg twice daily.  If patient has recurrent chest pain, PCI to distal RCA could be considered (will need to discuss aspirin desensitization versus alternative antiplatelet strategies given aspirin allergy). Aggressive secondary prevention, including high-intensity statin therapy and smoking cessation. Monitor overnight.  If no further chest pain, likely discharge home tomorrow.   Recommend clopidogrel 75 mg daily for moderate to severe CAD and history of aspirin allergy.   Yvonne Kendall, MD [facility] Pager: [phone]  Coronary Diagrams  Diagnostic Dominance: Right     Echo 01/2018 Study Conclusions   - Left ventricle: The cavity size was normal. There was mild    concentric hypertrophy. Systolic function was normal. The    estimated ejection fraction was in the range of 55% to 60%. Wall    motion was normal; there were no regional wall motion    abnormalities. Doppler parameters are consistent with abnormal    left ventricular relaxation (grade 1 diastolic dysfunction).  - Pulmonary arteries: Systolic  pressure could not be accurately    estimated.    EKG:  EKG is ordered today.  The ekg ordered today demonstrates normal sinus rhythm, non specific  ST changes  Recent Labs: 08/22/2022: ALT 29; BUN 12; Creatinine, Ser 0.85; Hemoglobin 13.7; Platelets 189; Potassium 3.8; Sodium 137  Recent Lipid Panel    Component Value Date/Time   CHOL 195 08/22/2022 0939   TRIG 237 (H) 08/22/2022 0939   HDL 29 (L) 08/22/2022 0939   CHOLHDL 6.7 08/22/2022 0939   VLDL 47 (H) 08/22/2022 0939   LDLCALC 119 (H) 08/22/2022 0939    Physical Exam:    VS:  BP 136/82 (BP Location: Left Arm, Patient Position: Sitting, Cuff Size: Normal)   Pulse 74   Ht 6\' 2"  (1.88 m)   Wt 196 lb 9.6 oz (89.2 kg)   SpO2 98%   BMI 25.24 kg/m     Wt Readings from Last 3 Encounters:  08/22/22 196 lb 9.6 oz (89.2 kg)  08/07/22 201 lb (91.2 kg)  07/25/22 192 lb 9.6 oz (87.4 kg)     GEN:  Well nourished, well  developed in no acute distress HEENT: Normal NECK: No JVD; No carotid bruits LYMPHATICS: No lymphadenopathy CARDIAC: RRR, no murmurs, rubs, gallops RESPIRATORY:  diffusely diminished  ABDOMEN: Soft, non-tender, non-distended MUSCULOSKELETAL:  No edema; No deformity  SKIN: Warm and dry NEUROLOGIC:  Alert and oriented x 3 PSYCHIATRIC:  Normal affect   ASSESSMENT:    1. Coronary artery disease involving native coronary artery of native heart without angina pectoris   2. Other hyperlipidemia   3. Medication management   4. Essential hypertension   5. History of tobacco use   6. Pre-operative cardiovascular examination    PLAN:    In order of problems listed above:  CAD s/p PCI/DES pLCx Patient denies any chest pain.  He has some shortness of breath on exertion.  Patient overall tries to stay active despite right leg pain.  EKG today normal sinus rhythm and no ischemic changes.  Patient is off all medications.  He has a allergy to aspirin.  Patient may have been on Plavix in the past.  I will check labs  today with CBC, c-Met, fasting lipid panel I will restart Lipitor 80 mg daily.  Follow-up I will look to start Plavix and beta-blocker.  HLD Check labs today.  Restart Lipitor 80 mg daily  HTN BP is reasonable. Will likely start BB at follow-up.   H/o tobacco use He quit smoking and is now vaping  Pre-op cardiac evaluation VVS is planning on right femoral endarterectomy of right iliac stents.  From a cardiac perspective, patient is overall stable.  He denies chest pain, significant shortness of breath, lower leg edema, orthopnea, PND, palpitations.  He has been off cardiac medications for a while due to cost issues.  Patient is now in a better financial place and willing to re-start cardiac medications.  I will restart Lipitor 80 mg daily. I will check general labs today.  We will look to restart Plavix and beta-blocker at follow-up.  EKG today shows normal sinus rhythm with no ischemic changes.  Despite leg pain/claudication, patient stays fairly active with METs greater than 4.  According to the revised cardiac risk index patient is class III risk at 10.1% 30-day risk of death, MI, or cardiac arrest.  No further cardiac workup required prior to procedure.  Disposition: Follow up in 3 month(s) with APP    Signed, Laird Runnion Ardelle Lesches  08/22/2022 12:29 PM    [facility]

## 2022-08-29 ENCOUNTER — Ambulatory Visit (INDEPENDENT_AMBULATORY_CARE_PROVIDER_SITE_OTHER): Payer: Medicare PPO | Admitting: Vascular Surgery

## 2022-08-29 VITALS — BP 158/72 | HR 75 | Resp 16 | Ht 74.0 in | Wt 195.0 lb

## 2022-08-29 DIAGNOSIS — F17200 Nicotine dependence, unspecified, uncomplicated: Secondary | ICD-10-CM | POA: Diagnosis not present

## 2022-08-29 DIAGNOSIS — E782 Mixed hyperlipidemia: Secondary | ICD-10-CM

## 2022-08-29 DIAGNOSIS — I70221 Atherosclerosis of native arteries of extremities with rest pain, right leg: Secondary | ICD-10-CM | POA: Diagnosis not present

## 2022-08-29 NOTE — Assessment & Plan Note (Signed)
A primary underlying cause of vascular disease and he understands the importance of smoking cessation.

## 2022-08-29 NOTE — H&P (View-Only) (Signed)
  MRN : [id number]  Aaron Knox is a 67 y.o. (11/06/1955) male who presents with chief complaint of  Chief Complaint  Patient presents with   Follow-up  .  History of Present Illness: Patient returns today in follow up of his severe PAD.  He is now having pain that wakes him at night in his right leg.  He can only walk very short distances before having to stop.  He had an angiogram a few weeks ago which demonstrated occlusion of the right common femoral artery and occlusion of the proximal right superficial femoral artery with associated iliac artery occlusive disease.  He had no periprocedural complications.  He is here to discuss his surgical options for revascularization  Current Outpatient Medications  Medication Sig Dispense Refill   atorvastatin (LIPITOR) 80 MG tablet Take 1 tablet (80 mg total) by mouth daily at 6 PM. 90 tablet 3   clopidogrel (PLAVIX) 75 MG tablet Take 1 tablet by mouth daily.     No current facility-administered medications for this visit.    Past Medical History:  Diagnosis Date   CAD in native artery    a. posterior STEMI 08/2014; b. LHC 5/16 - 99% stenosis in the proximal left circumflex, 40-50% mid left circumflex stenosis and mild nonobstructive disease affecting the LAD and RCA, successful PCI/DES to pLCx, EF 40%   Systolic dysfunction    a. TTE 5/16: EF 45-50%, Gr1DD, inferior HK, aortic sclerosis w/o stenosis    Past Surgical History:  Procedure Laterality Date   CARDIAC CATHETERIZATION N/A 08/09/2014   Procedure: Left Heart Cath and Coronary Angiography;  Surgeon: Muhammad A Arida, MD;  Location: [facility];  Service: Cardiovascular;  Laterality: N/A;   CARDIAC CATHETERIZATION N/A 08/09/2014   Procedure: Coronary Stent Intervention;  Surgeon: Muhammad A Arida, MD;  Location: [facility];  Service: Cardiovascular;  Laterality: N/A;   CORONARY ANGIOPLASTY     LEFT HEART CATH AND CORONARY ANGIOGRAPHY N/A 01/29/2018   Procedure:  LEFT HEART CATH AND CORONARY ANGIOGRAPHY;  Surgeon: End, Christopher, MD;  Location: [facility];  Service: Cardiovascular;  Laterality: N/A;   LOWER EXTREMITY ANGIOGRAPHY Right 08/07/2022   Procedure: Lower Extremity Angiography;  Surgeon: Sheretha Shadd S, MD;  Location: [facility];  Service: Cardiovascular;  Laterality: Right;   nerve reconstruction Left 1980   hand     Social History   Tobacco Use   Smoking status: Every Day    Packs/day: .25    Types: Cigarettes   Smokeless tobacco: Never  Substance Use Topics   Alcohol use: Yes    Alcohol/week: 3.0 standard drinks of alcohol    Types: 3 Glasses of wine per week   Drug use: No       Family History  Problem Relation Age of Onset   CAD Maternal Grandfather   No bleeding or clotting disorder No aneurysms  Allergies  Allergen Reactions   Aspirin Anaphylaxis   Penicillins Anaphylaxis    Has patient had a PCN reaction causing immediate rash, facial/tongue/throat swelling, SOB or lightheadedness with hypotension: Yes Has patient had a PCN reaction causing severe rash involving mucus membranes or skin necrosis: No Has patient had a PCN reaction that required hospitalization: Yes Has patient had a PCN reaction occurring within the last 10 years: No If all of the above answers are "NO", then may proceed with Cephalosporin use.     REVIEW OF SYSTEMS (Negative unless checked)  Constitutional: []Weight loss  []  Fever  []Chills Cardiac: []Chest pain   []Chest pressure   []Palpitations   []Shortness of breath when laying flat   []Shortness of breath at rest   []Shortness of breath with exertion. Vascular:  [x]Pain in legs with walking   []Pain in legs at rest   []Pain in legs when laying flat   [x]Claudication   []Pain in feet when walking  []Pain in feet at rest  [x]Pain in feet when laying flat   []History of DVT   []Phlebitis   []Swelling in legs   []Varicose veins   []Non-healing ulcers Pulmonary:   []Uses home  oxygen   []Productive cough   []Hemoptysis   []Wheeze  []COPD   []Asthma Neurologic:  []Dizziness  []Blackouts   []Seizures   []History of stroke   []History of TIA  []Aphasia   []Temporary blindness   []Dysphagia   []Weakness or numbness in arms   []Weakness or numbness in legs Musculoskeletal:  []Arthritis   []Joint swelling   []Joint pain   []Low back pain Hematologic:  []Easy bruising  []Easy bleeding   []Hypercoagulable state   []Anemic   Gastrointestinal:  []Blood in stool   []Vomiting blood  []Gastroesophageal reflux/heartburn   []Abdominal pain Genitourinary:  []Chronic kidney disease   []Difficult urination  []Frequent urination  []Burning with urination   []Hematuria Skin:  []Rashes   []Ulcers   []Wounds Psychological:  []History of anxiety   [] History of major depression.  Physical Examination  BP (!) 158/72 (BP Location: Left Arm)   Pulse 75   Resp 16   Ht 6' 2" (1.88 m)   Wt 195 lb (88.5 kg)   BMI 25.04 kg/m  Gen:  WD/WN, NAD Head: [place]/AT, No temporalis wasting. Ear/Nose/Throat: Hearing grossly intact, nares w/o erythema or drainage Eyes: Conjunctiva clear. Sclera non-icteric Neck: Supple.  Trachea midline Pulmonary:  Good air movement, no use of accessory muscles.  Cardiac: RRR, no JVD Vascular:  Vessel Right Left  Radial Palpable Palpable                          PT Not Palpable 1+ Palpable  DP Not Palpable 2+ Palpable   Gastrointestinal: soft, non-tender/non-distended. No guarding/reflex.  Musculoskeletal: M/S 5/5 throughout.  No deformity or atrophy. No edema. Neurologic: Sensation grossly intact in extremities.  Symmetrical.  Speech is fluent.  Psychiatric: Judgment intact, Mood & affect appropriate for pt's clinical situation. Dermatologic: No rashes or ulcers noted.  No cellulitis or open wounds.      Labs Recent Results (from the past 2160 hour(s))  VAS US ABI WITH/WO TBI     Status: None   Collection Time: 07/25/22 11:07 AM  Result Value Ref  Range   Right ABI 0.53    Left ABI 1.04   BUN     Status: None   Collection Time: 08/07/22  9:27 AM  Result Value Ref Range   BUN 13 8 - 23 mg/dL    Comment: Performed at [facility], [address] [place], [place] [address]  Creatinine, serum     Status: None   Collection Time: 08/07/22  9:27 AM  Result Value Ref Range   Creatinine, Ser 0.87 0.61 - 1.24 mg/dL   GFR, Estimated >60 >60 mL/min    Comment: (NOTE) Calculated using the CKD-EPI Creatinine Equation (2021) Performed at [facility], [address] [place], [place] [address]   Comprehensive metabolic panel       Status: Abnormal   Collection Time: 08/22/22  9:39 AM  Result Value Ref Range   Sodium 137 135 - 145 mmol/L   Potassium 3.8 3.5 - 5.1 mmol/L   Chloride 104 98 - 111 mmol/L   CO2 26 22 - 32 mmol/L   Glucose, Bld 100 (H) 70 - 99 mg/dL    Comment: Glucose reference range applies only to samples taken after fasting for at least 8 hours.   BUN 12 8 - 23 mg/dL   Creatinine, Ser 0.85 0.61 - 1.24 mg/dL   Calcium 9.3 8.9 - 10.3 mg/dL   Total Protein 7.5 6.5 - 8.1 g/dL   Albumin 4.5 3.5 - 5.0 g/dL   AST 27 15 - 41 U/L   ALT 29 0 - 44 U/L   Alkaline Phosphatase 79 38 - 126 U/L   Total Bilirubin 0.8 0.3 - 1.2 mg/dL   GFR, Estimated >60 >60 mL/min    Comment: (NOTE) Calculated using the CKD-EPI Creatinine Equation (2021)    Anion gap 7 5 - 15    Comment: Performed at [facility], [address] [place], [place] [address]  Lipid panel     Status: Abnormal   Collection Time: 08/22/22  9:39 AM  Result Value Ref Range   Cholesterol 195 0 - 200 mg/dL   Triglycerides 237 (H) <150 mg/dL   HDL 29 (L) >40 mg/dL   Total CHOL/HDL Ratio 6.7 RATIO   VLDL 47 (H) 0 - 40 mg/dL   LDL Cholesterol 119 (H) 0 - 99 mg/dL    Comment:        Total Cholesterol/HDL:CHD Risk Coronary Heart Disease Risk Table                     Men   Women  1/2 Average Risk   3.4   3.3  Average Risk       5.0   4.4   2 X Average Risk   9.6   7.1  3 X Average Risk  23.4   11.0        Use the calculated Patient Ratio above and the CHD Risk Table to determine the patient's CHD Risk.        ATP III CLASSIFICATION (LDL):  <100     mg/dL   Optimal  100-129  mg/dL   Near or Above                    Optimal  130-159  mg/dL   Borderline  160-189  mg/dL   High  >190     mg/dL   Very High Performed at [facility], [address] [place], [place] [address]   CBC     Status: None   Collection Time: 08/22/22  9:39 AM  Result Value Ref Range   WBC 4.8 4.0 - 10.5 K/uL   RBC 4.46 4.22 - 5.81 MIL/uL   Hemoglobin 13.7 13.0 - 17.0 g/dL   HCT 41.5 39.0 - 52.0 %   MCV 93.0 80.0 - 100.0 fL   MCH 30.7 26.0 - 34.0 pg   MCHC 33.0 30.0 - 36.0 g/dL   RDW 12.4 11.5 - 15.5 %   Platelets 189 150 - 400 K/uL   nRBC 0.0 0.0 - 0.2 %    Comment: Performed at [facility], [address] [place], [place] [address]    Radiology PERIPHERAL VASCULAR CATHETERIZATION  Result Date: 08/07/2022 See surgical note for result.     Assessment/Plan  Atherosclerosis of native arteries of extremity with rest pain (HCC) The patient has extensive right lower extremity peripheral arterial disease with occlusion of the common femoral artery, superficial femoral artery proximally, and associated iliac artery occlusive disease we discussed the general treatment for this would be a hybrid procedure with a femoral endarterectomy with iliac intervention as well as potentially SFA intervention if the endarterectomy cannot be carried down along the we discussed the risk and benefits of the procedure.  We discussed a 2 to 3-day hospital course being typical.  We discussed the reason and rationale for treatment.  Patient is agreeable to proceed.  Hyperlipidemia lipid control important in reducing the progression of atherosclerotic disease. Continue statin therapy   Smoker A primary underlying cause of vascular disease and he  understands the importance of smoking cessation.    Lanique Gonzalo, MD  08/29/2022 2:09 PM    This note was created with Dragon medical transcription system.  Any errors from dictation are purely unintentional 

## 2022-08-29 NOTE — Assessment & Plan Note (Signed)
lipid control important in reducing the progression of atherosclerotic disease. Continue statin therapy  

## 2022-08-29 NOTE — Patient Instructions (Signed)
Femoral Endarterectomy, Care After The following information offers guidance on how to care for yourself after your procedure. Your health care provider may also give you more specific instructions. If you have problems or questions, contact your health care provider. What can I expect after the procedure? After the procedure, it is common to have mild pain in the incision area. Follow these instructions at home: Incision care     Follow instructions from your health care provider about how to take care of your incision. Make sure you: Wash your hands with soap and water for at least 20 seconds before and after you change your bandage (dressing). If soap and water are not available, use hand sanitizer. Change your dressing as told by your health care provider. Leave stitches (sutures), skin glue, or adhesive strips in place. These skin closures may need to stay in place for 2 weeks or longer. If adhesive strip edges start to loosen and curl up, you may trim the loose edges. Do not remove adhesive strips completely unless your health care provider tells you to do that. Do not  apply lotions, creams, or ointments unless your health care provider tells you to do that. Check your incision area every day for signs of infection. Check for: Redness, swelling, or more pain. Fluid or blood. Warmth. Pus or a bad smell. Do not take baths, swim, or use a hot tub until your health care provider approves. Ask your health care provider if you may take showers. You may only be allowed to take sponge baths. Activity If you were given a sedative during the procedure, it can affect you for several hours. Do not drive or operate machinery until your health care provider says that it is safe. Rest as told by your health care provider. Avoid sitting for a long time without moving. Get up to take short walks every 1-2 hours. This is important to improve blood flow and breathing. Ask for help if you feel weak or  unsteady. Until your health care provider says that it is safe: Do not lift anything that is heavier than 10 lb (4.5 kg), or the limit that you are told. Do not do any activities that take a lot of effort or energy. Do not take long car trips. Do not travel by air. Do not stand for long periods at a time. Raise (elevate) your leg above the level of your heart while you are sitting or lying down. Do exercises as told by your health care provider. Talk with your health care provider before starting a new exercise plan. Return to your normal activities as told by your health care provider. Ask your health care provider what activities are safe for you. Eating and drinking Eat a heart-healthy diet that includes fruits, vegetables, and whole grains. Avoid saturated fats. Follow instructions from your health care provider about any other eating and drinking restrictions. Lifestyle  Do not use any products that contain nicotine or tobacco. These products include cigarettes, chewing tobacco, and vaping devices, such as e-cigarettes. These can delay healing after surgery. If you need help quitting, ask your health care provider. Maintain a healthy weight. Talk with your health care provider if you need help. Medicines Take over-the-counter and prescription medicines only as told by your health care provider. Ask your health care provider if the medicine prescribed to you: Requires you to avoid driving or using machinery. Can cause constipation. You may need to take these actions to prevent or treat constipation: Drink enough   fluid to keep your urine pale yellow. Take over-the-counter or prescription medicines. Eat foods that are high in fiber, such as beans, whole grains, and fresh fruits and vegetables. Limit foods that are high in fat and processed sugars, such as fried or sweet foods. If you are taking blood thinners: Talk with your health care provider before you take any medicines that  contain aspirin or NSAIDs, such as ibuprofen. These medicines increase your risk for dangerous bleeding. Take your medicine exactly as told at the same time every day. Avoid activities that could cause injury or bruising, and follow instructions about how to prevent falls. Wear a medical alert bracelet or carry a card that lists what medicines you take. General instructions Wear compression stockings as told by your health care provider. These stockings help to prevent blood clots and reduce swelling in your legs. Keep all follow-up visits. This is important. Contact a health care provider if you have: Signs of infection at your incision, such as: Redness, swelling, or more pain. Fluid or blood. Warmth. Pus or a bad smell. A fever. Pain that does not get better with medicine. Pain in your leg while you walk. Difficulty urinating. Get help right away if: You have severe pain in your leg. You have shortness of breath. You have chest pain. You see red streaks going away from your incision. You have redness, swelling, or warmth in your calf or leg. Your leg or foot turns blue or gray or feels cold or numb. These symptoms may represent a serious problem that is an emergency. Do not wait to see if the symptoms will go away. Get medical help right away. Call your local emergency services (911 in the [place] Do not drive yourself to the hospital. Summary After a femoral endarterectomy, it is common to have mild pain in the incision area. Check your incision area every day for signs of infection, such as redness or swelling. Follow instructions about rest and activity as told by your health care provider. Do not use any products that contain nicotine or tobacco. These products include cigarettes, chewing tobacco, and vaping devices, such as e-cigarettes. If you need help quitting, ask your health care provider. You should get help right away if you notice red streaks leading away from your  incision or if your leg or foot turns blue or gray or feels cold or numb. This information is not intended to replace advice given to you by your health care provider. Make sure you discuss any questions you have with your health care provider. Document Revised: 09/29/2019 Document Reviewed: 09/29/2019 Elsevier Patient Education  2023 [employer]  

## 2022-08-29 NOTE — Assessment & Plan Note (Signed)
The patient has extensive right lower extremity peripheral arterial disease with occlusion of the common femoral artery, superficial femoral artery proximally, and associated iliac artery occlusive disease we discussed the general treatment for this would be a hybrid procedure with a femoral endarterectomy with iliac intervention as well as potentially SFA intervention if the endarterectomy cannot be carried down along the we discussed the risk and benefits of the procedure.  We discussed a 2 to 3-day hospital course being typical.  We discussed the reason and rationale for treatment.  Patient is agreeable to proceed.

## 2022-08-29 NOTE — Progress Notes (Signed)
MRN : [id number]  Aaron Knox is a 67 y.o. ([birth year]) male who presents with chief complaint of  Chief Complaint  Patient presents with   Follow-up  .  History of Present Illness: Patient returns today in follow up of his severe PAD.  He is now having pain that wakes him at night in his right leg.  He can only walk very short distances before having to stop.  He had an angiogram a few weeks ago which demonstrated occlusion of the right common femoral artery and occlusion of the proximal right superficial femoral artery with associated iliac artery occlusive disease.  He had no periprocedural complications.  He is here to discuss his surgical options for revascularization  Current Outpatient Medications  Medication Sig Dispense Refill   atorvastatin (LIPITOR) 80 MG tablet Take 1 tablet (80 mg total) by mouth daily at 6 PM. 90 tablet 3   clopidogrel (PLAVIX) 75 MG tablet Take 1 tablet by mouth daily.     No current facility-administered medications for this visit.    Past Medical History:  Diagnosis Date   CAD in native artery    a. posterior STEMI 08/2014; b. LHC 5/16 - 99% stenosis in the proximal left circumflex, 40-50% mid left circumflex stenosis and mild nonobstructive disease affecting the LAD and RCA, successful PCI/DES to pLCx, EF 40%   Systolic dysfunction    a. TTE 5/16: EF 45-50%, Gr1DD, inferior HK, aortic sclerosis w/o stenosis    Past Surgical History:  Procedure Laterality Date   CARDIAC CATHETERIZATION N/A 08/09/2014   Procedure: Left Heart Cath and Coronary Angiography;  Surgeon: Iran Ouch, MD;  Location: [facility];  Service: Cardiovascular;  Laterality: N/A;   CARDIAC CATHETERIZATION N/A 08/09/2014   Procedure: Coronary Stent Intervention;  Surgeon: Iran Ouch, MD;  Location: [facility];  Service: Cardiovascular;  Laterality: N/A;   CORONARY ANGIOPLASTY     LEFT HEART CATH AND CORONARY ANGIOGRAPHY N/A 01/29/2018   Procedure:  LEFT HEART CATH AND CORONARY ANGIOGRAPHY;  Surgeon: Yvonne Kendall, MD;  Location: [facility] CV [facility];  Service: Cardiovascular;  Laterality: N/A;   LOWER EXTREMITY ANGIOGRAPHY Right 08/07/2022   Procedure: Lower Extremity Angiography;  Surgeon: Annice Needy, MD;  Location: [facility] CV [facility];  Service: Cardiovascular;  Laterality: Right;   nerve reconstruction Left 1980   hand     Social History   Tobacco Use   Smoking status: Every Day    Packs/day: .25    Types: Cigarettes   Smokeless tobacco: Never  Substance Use Topics   Alcohol use: Yes    Alcohol/week: 3.0 standard drinks of alcohol    Types: 3 Glasses of wine per week   Drug use: No       Family History  Problem Relation Age of Onset   CAD Maternal Grandfather   No bleeding or clotting disorder No aneurysms  Allergies  Allergen Reactions   Aspirin Anaphylaxis   Penicillins Anaphylaxis    Has patient had a PCN reaction causing immediate rash, facial/tongue/throat swelling, SOB or lightheadedness with hypotension: Yes Has patient had a PCN reaction causing severe rash involving mucus membranes or skin necrosis: No Has patient had a PCN reaction that required hospitalization: Yes Has patient had a PCN reaction occurring within the last 10 years: No If all of the above answers are "NO", then may proceed with Cephalosporin use.     REVIEW OF SYSTEMS (Negative unless checked)  Constitutional: [] Weight loss  []   Fever  [] Chills Cardiac: [] Chest pain   [] Chest pressure   [] Palpitations   [] Shortness of breath when laying flat   [] Shortness of breath at rest   [] Shortness of breath with exertion. Vascular:  [x] Pain in legs with walking   [] Pain in legs at rest   [] Pain in legs when laying flat   [x] Claudication   [] Pain in feet when walking  [] Pain in feet at rest  [x] Pain in feet when laying flat   [] History of DVT   [] Phlebitis   [] Swelling in legs   [] Varicose veins   [] Non-healing ulcers Pulmonary:   [] Uses home  oxygen   [] Productive cough   [] Hemoptysis   [] Wheeze  [] COPD   [] Asthma Neurologic:  [] Dizziness  [] Blackouts   [] Seizures   [] History of stroke   [] History of TIA  [] Aphasia   [] Temporary blindness   [] Dysphagia   [] Weakness or numbness in arms   [] Weakness or numbness in legs Musculoskeletal:  [] Arthritis   [] Joint swelling   [] Joint pain   [] Low back pain Hematologic:  [] Easy bruising  [] Easy bleeding   [] Hypercoagulable state   [] Anemic   Gastrointestinal:  [] Blood in stool   [] Vomiting blood  [] Gastroesophageal reflux/heartburn   [] Abdominal pain Genitourinary:  [] Chronic kidney disease   [] Difficult urination  [] Frequent urination  [] Burning with urination   [] Hematuria Skin:  [] Rashes   [] Ulcers   [] Wounds Psychological:  [] History of anxiety   []  History of major depression.  Physical Examination  BP (!) 158/72 (BP Location: Left Arm)   Pulse 75   Resp 16   Ht 6\' 2"  (1.88 m)   Wt 195 lb (88.5 kg)   BMI 25.04 kg/m  Gen:  WD/WN, NAD Head: [place]/AT, No temporalis wasting. Ear/Nose/Throat: Hearing grossly intact, nares w/o erythema or drainage Eyes: Conjunctiva clear. Sclera non-icteric Neck: Supple.  Trachea midline Pulmonary:  Good air movement, no use of accessory muscles.  Cardiac: RRR, no JVD Vascular:  Vessel Right Left  Radial Palpable Palpable                          PT Not Palpable 1+ Palpable  DP Not Palpable 2+ Palpable   Gastrointestinal: soft, non-tender/non-distended. No guarding/reflex.  Musculoskeletal: M/S 5/5 throughout.  No deformity or atrophy. No edema. Neurologic: Sensation grossly intact in extremities.  Symmetrical.  Speech is fluent.  Psychiatric: Judgment intact, Mood & affect appropriate for pt's clinical situation. Dermatologic: No rashes or ulcers noted.  No cellulitis or open wounds.      Labs Recent Results (from the past 2160 hour(s))  VAS [place] ABI WITH/WO TBI     Status: None   Collection Time: 07/25/22 11:07 AM  Result Value Ref  Range   Right ABI 0.53    Left ABI 1.04   BUN     Status: None   Collection Time: 08/07/22  9:27 AM  Result Value Ref Range   BUN 13 8 - 23 mg/dL    Comment: Performed at [facility], [address] [place], [place] [address]  Creatinine, serum     Status: None   Collection Time: 08/07/22  9:27 AM  Result Value Ref Range   Creatinine, Ser 0.87 0.61 - 1.24 mg/dL   GFR, Estimated >82 >95 mL/min    Comment: (NOTE) Calculated using the CKD-EPI Creatinine Equation (2021) Performed at [facility], [address], [place], [place] [address]   Comprehensive metabolic panel  Status: Abnormal   Collection Time: 08/22/22  9:39 AM  Result Value Ref Range   Sodium 137 135 - 145 mmol/L   Potassium 3.8 3.5 - 5.1 mmol/L   Chloride 104 98 - 111 mmol/L   CO2 26 22 - 32 mmol/L   Glucose, Bld 100 (H) 70 - 99 mg/dL    Comment: Glucose reference range applies only to samples taken after fasting for at least 8 hours.   BUN 12 8 - 23 mg/dL   Creatinine, Ser 4.09 0.61 - 1.24 mg/dL   Calcium 9.3 8.9 - 81.1 mg/dL   Total Protein 7.5 6.5 - 8.1 g/dL   Albumin 4.5 3.5 - 5.0 g/dL   AST 27 15 - 41 U/L   ALT 29 0 - 44 U/L   Alkaline Phosphatase 79 38 - 126 U/L   Total Bilirubin 0.8 0.3 - 1.2 mg/dL   GFR, Estimated >91 >47 mL/min    Comment: (NOTE) Calculated using the CKD-EPI Creatinine Equation (2021)    Anion gap 7 5 - 15    Comment: Performed at [facility], [address] [place], [place] [address]  Lipid panel     Status: Abnormal   Collection Time: 08/22/22  9:39 AM  Result Value Ref Range   Cholesterol 195 0 - 200 mg/dL   Triglycerides 213 (H) <150 mg/dL   HDL 29 (L) >08 mg/dL   Total CHOL/HDL Ratio 6.7 RATIO   VLDL 47 (H) 0 - 40 mg/dL   LDL Cholesterol 657 (H) 0 - 99 mg/dL    Comment:        Total Cholesterol/HDL:CHD Risk Coronary Heart Disease Risk Table                     Men   Women  1/2 Average Risk   3.4   3.3  Average Risk       5.0   4.4   2 X Average Risk   9.6   7.1  3 X Average Risk  23.4   11.0        Use the calculated Patient Ratio above and the CHD Risk Table to determine the patient's CHD Risk.        ATP III CLASSIFICATION (LDL):  <100     mg/dL   Optimal  846-962  mg/dL   Near or Above                    Optimal  130-159  mg/dL   Borderline  952-841  mg/dL   High  >324     mg/dL   Very High Performed at [facility], [address] [place], [place] [address]   CBC     Status: None   Collection Time: 08/22/22  9:39 AM  Result Value Ref Range   WBC 4.8 4.0 - 10.5 K/uL   RBC 4.46 4.22 - 5.81 MIL/uL   Hemoglobin 13.7 13.0 - 17.0 g/dL   HCT 72.5 36.6 - 44.0 %   MCV 93.0 80.0 - 100.0 fL   MCH 30.7 26.0 - 34.0 pg   MCHC 33.0 30.0 - 36.0 g/dL   RDW 34.7 42.5 - 95.6 %   Platelets 189 150 - 400 K/uL   nRBC 0.0 0.0 - 0.2 %    Comment: Performed at [facility], [address] [place], [place] [address]    Radiology PERIPHERAL VASCULAR CATHETERIZATION  Result Date: 08/07/2022 See surgical note for result.  Assessment/Plan  Atherosclerosis of native arteries of extremity with rest pain [facility]) The patient has extensive right lower extremity peripheral arterial disease with occlusion of the common femoral artery, superficial femoral artery proximally, and associated iliac artery occlusive disease we discussed the general treatment for this would be a hybrid procedure with a femoral endarterectomy with iliac intervention as well as potentially SFA intervention if the endarterectomy cannot be carried down along the we discussed the risk and benefits of the procedure.  We discussed a 2 to 3-day hospital course being typical.  We discussed the reason and rationale for treatment.  Patient is agreeable to proceed.  Hyperlipidemia lipid control important in reducing the progression of atherosclerotic disease. Continue statin therapy   Smoker A primary underlying cause of vascular disease and he  understands the importance of smoking cessation.    Festus Barren, MD  08/29/2022 2:09 PM    This note was created with Dragon medical transcription system.  Any errors from dictation are purely unintentional

## 2022-09-05 ENCOUNTER — Telehealth (INDEPENDENT_AMBULATORY_CARE_PROVIDER_SITE_OTHER): Payer: Self-pay

## 2022-09-05 NOTE — Telephone Encounter (Signed)
Spoke with the patient and he is scheduled with Dr. Wyn Quaker on 09/20/22 for a right femoral endarterectomy with iliac stent placements at [facility]. Pre-op appt is on 09/12/22 at 9:00 am at the [facility]. Pre-surgical instructions were discussed and will be mailed to the patient.

## 2022-09-06 ENCOUNTER — Telehealth: Payer: Self-pay | Admitting: Medical

## 2022-09-06 NOTE — Telephone Encounter (Signed)
Pt's appt on 8/15 with Cadence Furth, PA needs to be rescheduled due to provider being out of office. Left voicemail to reschedule.

## 2022-09-06 NOTE — Telephone Encounter (Signed)
Pt has been rescheduled to 8/23 at 9:15am.

## 2022-09-12 ENCOUNTER — Encounter
Admission: RE | Admit: 2022-09-12 | Discharge: 2022-09-12 | Disposition: A | Discharge status: Home / Self Care | Payer: Medicare PPO | Source: Ambulatory Visit | Attending: Vascular Surgery | Admitting: Vascular Surgery

## 2022-09-12 ENCOUNTER — Other Ambulatory Visit (INDEPENDENT_AMBULATORY_CARE_PROVIDER_SITE_OTHER): Payer: Self-pay | Admitting: Nurse Practitioner

## 2022-09-12 ENCOUNTER — Telehealth: Payer: Self-pay | Admitting: Cardiovascular Disease

## 2022-09-12 VITALS — BP 141/82 | HR 72 | Temp 97.7°F | Resp 14 | Ht 74.0 in | Wt 194.0 lb

## 2022-09-12 DIAGNOSIS — I70221 Atherosclerosis of native arteries of extremities with rest pain, right leg: Secondary | ICD-10-CM

## 2022-09-12 DIAGNOSIS — Z01812 Encounter for preprocedural laboratory examination: Secondary | ICD-10-CM | POA: Insufficient documentation

## 2022-09-12 HISTORY — DX: Hyperlipidemia, unspecified: E78.5

## 2022-09-12 HISTORY — DX: Atherosclerosis of native arteries of extremities with rest pain, unspecified extremity: I70.229

## 2022-09-12 LAB — TYPE AND SCREEN
ABO/RH(D): O POS
Antibody Screen: NEGATIVE

## 2022-09-12 LAB — SURGICAL PCR SCREEN
MRSA, PCR: NEGATIVE
Staphylococcus aureus: NEGATIVE

## 2022-09-12 NOTE — Telephone Encounter (Signed)
Pre-op team,   Will you please contact the patient. It appears he was cleared for surgery at his visit with Cadence Fransico Michael, PA-C on 08/22/2022. At that time it was discussed that patient would restart Plavix at his follow up visit. If he has since restarted the Plavix it is okay for him to stop it 7 days prior to surgery.   Thank you!  DW

## 2022-09-12 NOTE — Pre-Procedure Instructions (Signed)
Secure chat to Dr. Wyn Quaker and Rolla Plate, NP:  Dahlia Bailiff, RN: he is on plavix and has a cardiac stent; surgery femoral endarterectomy; he is not taking aspirin....(allergy to aspirin) should he continue the plavix or stop taking it 7 days prior as your protocol sheet states?   Rolla Plate, NP: Looking at Dr Kristeen Mans notes I am unable to tell if he plans on using a vein or a graft so I would say Plavix hold for 7 days. Thanks

## 2022-09-12 NOTE — Telephone Encounter (Signed)
Pt calling to f/u on Clearance and whether he is able to stop mediations for 7 days due to procedure being scheduled for this Thursdays. Please advise

## 2022-09-12 NOTE — Telephone Encounter (Signed)
Spoke with patient and made him aware of the recommendations to holding Plavix per Carlos Levering, NP. Patient verbalized understanding and thanked me for the call back.

## 2022-09-12 NOTE — Patient Instructions (Addendum)
Your procedure is scheduled on: Wednesday, June 12 Report to the Registration Desk on the 1st floor of the [facility] To find out your arrival time, please call [phone] between 1PM - 3PM on: Tuesday, June 11 If your arrival time is 6:00 am, do not arrive before that time as the [facility] entrance doors do not open until 6:00 am.  REMEMBER: Instructions that are not followed completely may result in serious medical risk, up to and including death; or upon the discretion of your surgeon and anesthesiologist your surgery may need to be rescheduled.  Do not eat food after midnight the night before surgery.  No gum chewing or hard candies.  You may however, drink CLEAR liquids up to 2 hours before you are scheduled to arrive for your surgery. Do not drink anything within 2 hours of your scheduled arrival time.  Clear liquids include: - water  - apple juice without pulp - gatorade (not RED colors) - black coffee or tea (Do NOT add milk or creamers to the coffee or tea) Do NOT drink anything that is not on this list.  One week prior to surgery: starting June 5 Stop Anti-inflammatories (NSAIDS) such as Advil, Aleve, Ibuprofen, Motrin, Naproxen, Naprosyn and Aspirin based products such as Excedrin, Goody's Powder, BC Powder. Stop ANY OVER THE COUNTER supplements until after surgery. You may however, continue to take Tylenol if needed for pain up until the day of surgery.  Continue taking all prescribed medications with the exception of the following:  Per Rolla Plate, NP - hold clopidogrel (Plavix) for 7 days before surgery. Last day to take is today, June 4. Resume AFTER surgery per surgeon instruction.  TAKE ONLY THESE MEDICATIONS THE MORNING OF SURGERY WITH A SIP OF WATER:  Atorvastatin (Lipitor)  No Alcohol for 24 hours before or after surgery.  No Smoking including e-cigarettes for 24 hours before surgery.  No chewable tobacco products for at least 6 hours before surgery.   No nicotine patches on the day of surgery.  Do not use any "recreational" drugs for at least a week (preferably 2 weeks) before your surgery.  Please be advised that the combination of cocaine and anesthesia may have negative outcomes, up to and including death. If you test positive for cocaine, your surgery will be cancelled.  On the morning of surgery brush your teeth with toothpaste and water, you may rinse your mouth with mouthwash if you wish. Do not swallow any toothpaste or mouthwash.  Use CHG Soap as directed on instruction sheet.  Do not wear jewelry.  Do not wear lotions, powders, or perfumes.   Do not shave body hair from the neck down 48 hours before surgery.  Contact lenses, hearing aids and dentures may not be worn into surgery.  Do not bring valuables to the hospital. [facility] is not responsible for any missing/lost belongings or valuables.   Notify your doctor if there is any change in your medical condition (cold, fever, infection).  Wear comfortable clothing (specific to your surgery type) to the hospital.  After surgery, you can help prevent lung complications by doing breathing exercises.  Take deep breaths and cough every 1-2 hours. Your doctor may order a device called an Incentive Spirometer to help you take deep breaths.  If you are being admitted to the hospital overnight, leave your suitcase in the car. After surgery it may be brought to your room.  Please call the Pre-admissions Testing Dept. at [phone] if you  have any questions about these instructions.  Surgery Visitation Policy:  Patients having surgery or a procedure may have two visitors.  Children under the age of 49 must have an adult with them who is not the patient.  Inpatient Visitation:    Visiting hours are 7 a.m. to 8 p.m. Up to four visitors are allowed at one time in a patient room. The visitors may rotate out with other people during the day.  One visitor age 90 or older  may stay with the patient overnight and must be in the room by 8 p.m.     Preparing for Surgery with CHLORHEXIDINE GLUCONATE (CHG) Soap  Chlorhexidine Gluconate (CHG) Soap  o An antiseptic cleaner that kills germs and bonds with the skin to continue killing germs even after washing  o Used for showering the night before surgery and morning of surgery  Before surgery, you can play an important role by reducing the number of germs on your skin.  CHG (Chlorhexidine gluconate) soap is an antiseptic cleanser which kills germs and bonds with the skin to continue killing germs even after washing.  Please do not use if you have an allergy to CHG or antibacterial soaps. If your skin becomes reddened/irritated stop using the CHG.  1. Shower the NIGHT BEFORE SURGERY and the MORNING OF SURGERY with CHG soap.  2. If you choose to wash your hair, wash your hair first as usual with your normal shampoo.  3. After shampooing, rinse your hair and body thoroughly to remove the shampoo.  4. Use CHG as you would any other liquid soap. You can apply CHG directly to the skin and wash gently with a scrungie or a clean washcloth.  5. Apply the CHG soap to your body only from the neck down. Do not use on open wounds or open sores. Avoid contact with your eyes, ears, mouth, and genitals (private parts). Wash face and genitals (private parts) with your normal soap.  6. Wash thoroughly, paying special attention to the area where your surgery will be performed.  7. Thoroughly rinse your body with warm water.  8. Do not shower/wash with your normal soap after using and rinsing off the CHG soap.  9. Pat yourself dry with a clean towel.  10. Wear clean pajamas to bed the night before surgery.  12. Place clean sheets on your bed the night of your first shower and do not sleep with pets.  13. Shower again with the CHG soap on the day of surgery prior to arriving at the hospital.  14. Do not apply any  deodorants/lotions/powders.  15. Please wear clean clothes to the hospital.

## 2022-09-18 ENCOUNTER — Encounter: Payer: Self-pay | Admitting: Vascular Surgery

## 2022-09-18 NOTE — Progress Notes (Signed)
Perioperative / Anesthesia Services  Pre-Admission Testing Clinical Review / Preoperative Anesthesia Consult  Date: 09/19/22  Patient Demographics:  Name: Aaron Knox [facility] DOB:   08-25-1955 MRN:   [id number]  Planned Surgical Procedure(s):    Case: [id number] Date/Time: 09/20/22 1144   Procedures:      ENDARTERECTOMY FEMORAL (Right)     INSERTION OF ILIAC STENT (Right)     APPLICATION OF CELL SAVER   Anesthesia type: General   Pre-op diagnosis: ASO WITH REST PAIN   Location: [facility] OR ROOM 08 / [facility] ORS FOR ANESTHESIA GROUP   Surgeons: Annice Needy, MD     NOTE: Available PAT nursing documentation and vital signs have been reviewed. Clinical nursing staff has updated patient's PMH/PSHx, current medication list, and drug allergies/intolerances to ensure comprehensive history available to assist in medical decision making as it pertains to the aforementioned surgical procedure and anticipated anesthetic course. Extensive review of available clinical information personally performed. [place] PMH and PSHx updated with any diagnoses/procedures that  may have been inadvertently omitted during his intake with the pre-admission testing department's nursing staff.  Clinical Discussion:  Aaron Knox is a 67 y.o. male who is submitted for pre-surgical anesthesia review and clearance prior to him undergoing the above procedure. Patient is a Former Smoker (quit 08/2022). 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  Patient is followed by cardiology Mariah Milling, MD). He was last seen in the cardiology clinic on 08/22/2022; notes reviewed. At the time of his clinic visit, patient doing well overall from a cardiovascular perspective.  Patient with intermittent episodes of shortness of breath associated with exertion.  Symptom reported to be stable and at baseline. Patient denied any chest pain, PND,  orthopnea, palpitations, significant peripheral edema, weakness, fatigue, vertiginous symptoms, or presyncope/syncope. Patient with a past medical history significant for cardiovascular diagnoses. Documented physical exam was grossly benign, providing no evidence of acute exacerbation and/or decompensation of the patient's known cardiovascular conditions.  Patient suffered an acute posterior wall STEMI on 08/09/2014.  Subsequent diagnostic LEFT heart catheterization was performed revealing multivessel CAD; 30% mid LAD, 99% proximal LCx, 45% mid LCx, 20% proximal RCA, and 40% distal RCA.  PCI was performed placing a 3.0 x 15 mm Resolute Integrity DES x 1 to the proximal LCx lesion resulting in a 10% residual postintervention stenosis.  Most recent TTE was performed on 01/28/2018 revealing a normal left ventricular systolic function with an EF of 55-60%.  There were no regional wall motion abnormalities. Left ventricular diastolic Doppler parameters consistent with abnormal relaxation (G1DD).  Right ventricular size and function normal.  There was no significant valvular regurgitation.  All transvalvular gradients were noted to be normal providing no evidence suggestive of valvular stenosis.  Aorta normal in size with no evidence of aneurysmal dilatation.  Patient with unstable angina leading to a repeat diagnostic LEFT heart catheterization on 01/29/2018.  Procedure revealed multivessel CAD; 30% mid LAD, 45% mid LCx, 50% proximal RCA, and 80% distal RCA.  Interventional cardiology made the decision to defer further intervention opting for medical management of patient's known coronary artery disease.  Patient with known peripheral vascular disease with associated intermittent claudication.  ABI studies performed on 07/25/2022 revealed an abnormal RIGHT ABI of 0.53 with an associated TBI of 0.48.  Symptoms consistent with lower extremity arterial disease.  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  SFA was occluded over the first 3 to 4 cm and then reconstituted through the aforementioned collaterals.  The remainder of the SFA was relatively small due to underperfusion with no evidence of significant disease.  Due to the aforementioned PVD, patient remains on daily antithrombotic therapy using clopidogrel.  Patient reportedly compliant with therapy with no evidence or reports of GI bleeding.  Due to financial concerns, patient had self discontinued all of his medications.  He reported that he was in a better financial place at the time of his cardiology visit and wished to restart his medications.  Blood pressure was relatively well-controlled at 136/82 mmHg, again off of his prescribed oral antihypertensives.  Given his history of's coronary artery disease and resulting STEMI, patient was restarted on his statin therapy (atorvastatin) for his HLD diagnosis and further ASCVD prevention.  He is not diabetic.  Patient does not have an OSAH diagnosis.  Despite claudication pain, patient remained relatively active and continue to work.  Patient able to complete all ADLs/IADLs without cardiovascular limitation. Functional capacity, as defined by DASI, is documented as being >/= 4 METS. He had quit smoking, however he had started to vape.  Patient with upcoming vascular procedure.  Plans were to introduce cardiac medications following upcoming procedure.  Other than the reinitiation of his statin therapy, no other changes were made to his medication regimen.  Patient to follow-up with outpatient cardiology in 3 months or sooner if needed.  Aaron Knox is scheduled for an ENDARTERECTOMY FEMORAL (Right); INSERTION OF ILIAC STENT (Right) on 09/20/2022 with Dr. Festus Barren, MD.  Given patient's past medical history significant for cardiovascular diagnoses, presurgical cardiac clearance was sought  by the PAT team.  Per cardiology, "from a cardiac perspective, patient is overall stable.  He denies chest pain, significant shortness of breath, lower leg edema, orthopnea, PND, palpitations.  He has been off cardiac medications for a while due to cost issues.  Patient is now in a better financial place and willing to re-start cardiac medications.  I will restart Lipitor 80 mg daily. I will check general labs today.  We will look to restart Plavix and beta-blocker at follow-up.  EKG today shows normal sinus rhythm with no ischemic changes.  Despite leg pain/claudication, patient stays fairly active with METs >4.  According to the RCRI patient is class III risk at 10.1% 30-day risk of death, MI, or cardiac arrest.  No further cardiac workup required prior to procedure".  In review of his medication reconciliation, it is noted that patient is currently on prescribed daily antithrombotic therapy. He has been instructed on recommendations for holding his clopidogrel for 7 days prior to his procedure with plans to restart as soon as postoperative bleeding risk felt to be minimized by his attending surgeon. The patient has been instructed that his last dose of his clopidogrel should be on 09/12/2022.  Patient denies previous perioperative complications with anesthesia in the past.  In review his EMR, there are no records available for review pertaining to past procedural/anesthetic courses within the [facility] system.     09/12/2022    9:23 AM 08/29/2022    1:10 PM 08/22/2022    8:42 AM  Vitals with BMI  Height 6\' 2"  6\' 2"  6\' 2"   Weight 194 lbs 195 lbs 196 lbs 10 oz  BMI 24.9 25.03 25.23  Systolic 141 158 409  Diastolic 82 72 82  Pulse 72 75 74    Providers/Specialists:  NOTE: Primary physician provider listed below. Patient may have been seen by APP or partner within same practice.   PROVIDER ROLE / SPECIALTY LAST Shanna Cisco, MD Vascular Surgery (Surgeon) 07/25/2022  Patient, No Pcp Per  Primary Care Provider N/A  Julien Nordmann, MD Cardiology 08/22/2022; update preop APP call on 09/12/2022  Theora Master, MD Neurology 06/06/2022   Allergies:  Aspirin and Penicillins  Current Home Medications:   No current facility-administered medications for this encounter.    atorvastatin (LIPITOR) 80 MG tablet   clopidogrel (PLAVIX) 75 MG tablet   History:   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)    CAD in native artery    a.) LHC/PCI 08/09/2014 (setting of STEMI): 30% mLAD, 99% pLCx (3.0 x 15 mm Resolute Integrity DES x 1 with 10% residual post-intervention stenosis), 45% mLCx, 20% pRCA, 40% dRCA); b.) LHC 01/29/2018: 30% mLAD, 45% mLCx, 50% pRCA, 80% dRCA --> med mgmt   DDD (degenerative disc disease), lumbar    Diastolic dysfunction    a.) TTE 08/11/2014 (s/p STEMI): EF 45-50%, inf HK, AoV sclerosis, G1DD; b.) TTE 02/28/2018: EF 55-60%, G1DD   History of bilateral cataract extraction 2023   HTN (hypertension)    Hyperlipidemia    Long term current use of antithrombotics/antiplatelets    a.) clopidogrel   ST elevation myocardial infarction (STEMI) of true posterior wall (HCC) 08/09/2014   a.) LHC/PCI 08/09/2014: 3.0 x 15 mm Resolute Integrity DES x 1 pLCx with 10% residual post-intervention stenosis)   Unstable angina [facility])    Past Surgical History:  Procedure Laterality Date   CARDIAC CATHETERIZATION N/A 08/09/2014   Procedure: Left Heart Cath and Coronary Angiography;  Surgeon: Iran Ouch, MD;  [facility] INVASIVE CV LAB;  Service: Cardiovascular;  Laterality: N/A;   CARDIAC CATHETERIZATION N/A 08/09/2014   Procedure: Coronary Stent Intervention;  Surgeon: Iran Ouch, MD;  Location: [facility] INVASIVE CV LAB;  Service: Cardiovascular;  Laterality: N/A;   CATARACT EXTRACTION W/ INTRAOCULAR LENS  IMPLANT, BILATERAL Bilateral 2023   CORONARY ANGIOPLASTY  08/09/2014   HAND SURGERY Left 1980   bone  chip with nerve damage   LEFT HEART CATH AND CORONARY ANGIOGRAPHY N/A 01/29/2018   Procedure: LEFT HEART CATH AND CORONARY ANGIOGRAPHY;  Surgeon: Yvonne Kendall, MD;  Location: [facility] INVASIVE CV LAB;  Service: Cardiovascular;  Laterality: N/A;   LOWER EXTREMITY ANGIOGRAPHY Right 08/07/2022   Procedure: Lower Extremity Angiography;  Surgeon: Annice Needy, MD;  Location: [facility] INVASIVE CV LAB;  Service: Cardiovascular;  Laterality: Right;   nerve reconstruction Left 1980   hand   Family History  Problem Relation Age of Onset   CAD Maternal Grandfather    Social History   Tobacco Use   Smoking status: Former    Packs/day: .25    Types: Cigarettes    Quit date: 08/2022    Years since quitting: 0.1   Smokeless tobacco: Never  Vaping Use   Vaping Use: Never used  Substance Use Topics   Alcohol use: Yes    Alcohol/week: 3.0 standard drinks of alcohol    Types: 3 Glasses of wine per week    Comment: weekends   Drug use: No    Pertinent Clinical Results:  LABS:  Lab Results  Component Value Date   WBC 4.8 08/22/2022   HGB 13.7 08/22/2022   HCT 41.5 08/22/2022   MCV 93.0 08/22/2022   PLT 189 08/22/2022  Lab Results  Component Value Date   NA 137 08/22/2022   K 3.8 08/22/2022   CO2 26 08/22/2022   GLUCOSE 100 (H) 08/22/2022   BUN 12 08/22/2022   CREATININE 0.85 08/22/2022   CALCIUM 9.3 08/22/2022   GFRNONAA >60 08/22/2022   Component Date Value Ref Range Status   ABO/RH(D) 09/12/2022 O POS   Final   Antibody Screen 09/12/2022 NEG   Final   Sample Expiration 09/12/2022 09/26/2022,2359   Final   Extend sample reason 09/12/2022    Final                   Value:NO TRANSFUSIONS OR PREGNANCY IN THE PAST 3 MONTHS Performed at [facility], [address] [place], [place] [address]    MRSA, PCR 09/12/2022 NEGATIVE  NEGATIVE Final   Staphylococcus aureus 09/12/2022 NEGATIVE  NEGATIVE Final   Comment: (NOTE) The Xpert SA Assay (FDA approved for NASAL specimens in  patients 58 years of age and older), is one component of a comprehensive surveillance program. It is not intended to diagnose infection nor to guide or monitor treatment. Performed at [facility], [address] [place], [place] [address]     ECG: Date: 08/22/2022 Time ECG obtained: 0847 AM Rate: 74 bpm Rhythm: normal sinus Axis (leads I and aVF): Normal Intervals: PR 156 ms. QRS 82 ms. QTc 427 ms. ST segment and T wave changes:  Comparison: Similar to previous tracing obtained on 02/11/2018   IMAGING / PROCEDURES: VAS [place] ABI WITH/WO TBI performed on 07/25/2022 Resting right ankle-brachial index indicates moderate right lower extremity arterial disease. The right toe-brachial index is abnormal. Limited imaging showed occluded right common femoral artery.  Resting left ankle-brachial index is within normal range. The left toe-brachial index is normal.   MR LUMBAR SPINE WO CONTRAST performed on 07/09/2021 Minimal degenerative changes of the lumbar spine without significant foraminal or canal stenosis at any level. No findings to explain patient's RIGHT-sided radicular symptoms  LEFT HEART CATHETERIZATION AND CORONARY ANGIOGRAPHY performed on 01/29/2018 Normal left ventricular systolic function with normal EF Normal LVEDP of 5-10 mmHg Multivessel CAD 30% mid LAD 45% mid LCx 50% proximal RCA 80% distal RCA Minimal luminal irregularities in OM1, OM 2, OM 3, mid RCA, and RPDA Recommendations Medical therapy of single-vessel coronary artery disease.  I add isosorbide mononitrate 30 mg daily and continue metoprolol tartrate 25 mg twice daily.  If patient has recurrent chest pain, PCI to distal RCA could be considered (will need to discuss aspirin desensitization versus alternative antiplatelet strategies given aspirin allergy). Aggressive secondary prevention, including high-intensity statin therapy and smoking cessation. Monitor overnight.  If no further chest pain,  likely discharge home tomorrow. Recommend clopidogrel 75 mg daily for moderate to severe CAD and history of aspirin allergy.   TRANSTHORACIC ECHOCARDIOGRAM performed on 01/28/2018 Normal left ventricular systolic function with an EF of 55 to 60% No regional wall motion abnormalities Left ventricular diastolic Doppler parameters consistent with abnormal relaxation (G1DD). PASP could not be accurately estimated No valvular regurgitation Normal gradients; no valvular stenosis  Impression and Plan:  Aaron Knox has been referred for pre-anesthesia review and clearance prior to him undergoing the planned anesthetic and procedural courses. Available labs, pertinent testing, and imaging results were personally reviewed by me in preparation for upcoming operative/procedural course. [facility] medical record has been updated following extensive record review and patient interview with PAT staff.   This patient has been appropriately cleared by cardiology with  an overall ACCEPTABLE risk of experiencing significant perioperative cardiovascular complications. Based on clinical review performed today (09/19/22), barring any significant acute changes in the patient's overall condition, it is anticipated that he will be able to proceed with the planned surgical intervention. Any acute changes in clinical condition may necessitate his procedure being postponed and/or cancelled. Patient will meet with anesthesia team (MD and/or CRNA) on the day of his procedure for preoperative evaluation/assessment. Questions regarding anesthetic course will be fielded at that time.   Pre-surgical instructions were reviewed with the patient during his PAT appointment, and questions were fielded to satisfaction by PAT clinical staff. He has been instructed on which medications that he will need to hold prior to surgery, as well as the ones that have been deemed safe/appropriate to take on the day of his procedure. As part of  the general education provided by PAT, patient made aware both verbally and in writing, that he would need to abstain from the use of any illegal substances during his perioperative course.  He was advised that failure to follow the provided instructions could necessitate case cancellation or result in serious perioperative complications up to and including death. Patient encouraged to contact PAT and/or his surgeon's office to discuss any questions or concerns that may arise prior to surgery; verbalized understanding.   Quentin Mulling, MSN, APRN, FNP-C, CEN [facility]  Peri-operative Services Nurse Practitioner Phone: [phone] Fax: [phone] 09/19/22 9:11 AM  NOTE: This note has been prepared using Dragon dictation software. Despite my best ability to proofread, there is always the potential that unintentional transcriptional errors may still occur from this process.

## 2022-09-20 ENCOUNTER — Encounter
Admission: RE | Disposition: A | Discharge status: Home / Self Care | Payer: Self-pay | Source: Ambulatory Visit | Attending: Vascular Surgery

## 2022-09-20 ENCOUNTER — Inpatient Hospital Stay: Payer: Medicare PPO | Admitting: Urgent Care

## 2022-09-20 ENCOUNTER — Inpatient Hospital Stay
Admission: RE | Admit: 2022-09-20 | Discharge: 2022-09-22 | DRG: 254 | Disposition: A | Discharge status: Home / Self Care | Payer: Medicare PPO | Source: Ambulatory Visit | Attending: Vascular Surgery | Admitting: Vascular Surgery

## 2022-09-20 ENCOUNTER — Inpatient Hospital Stay: Payer: Medicare PPO

## 2022-09-20 ENCOUNTER — Other Ambulatory Visit: Payer: Self-pay

## 2022-09-20 ENCOUNTER — Encounter: Payer: Self-pay | Admitting: Vascular Surgery

## 2022-09-20 DIAGNOSIS — I2510 Atherosclerotic heart disease of native coronary artery without angina pectoris: Secondary | ICD-10-CM | POA: Diagnosis present

## 2022-09-20 DIAGNOSIS — F17210 Nicotine dependence, cigarettes, uncomplicated: Secondary | ICD-10-CM | POA: Diagnosis present

## 2022-09-20 DIAGNOSIS — I252 Old myocardial infarction: Secondary | ICD-10-CM | POA: Diagnosis not present

## 2022-09-20 DIAGNOSIS — E785 Hyperlipidemia, unspecified: Secondary | ICD-10-CM | POA: Diagnosis present

## 2022-09-20 DIAGNOSIS — Z886 Allergy status to analgesic agent status: Secondary | ICD-10-CM | POA: Diagnosis not present

## 2022-09-20 DIAGNOSIS — Z79899 Other long term (current) drug therapy: Secondary | ICD-10-CM | POA: Diagnosis not present

## 2022-09-20 DIAGNOSIS — I10 Essential (primary) hypertension: Secondary | ICD-10-CM | POA: Diagnosis present

## 2022-09-20 DIAGNOSIS — Z9841 Cataract extraction status, right eye: Secondary | ICD-10-CM | POA: Diagnosis not present

## 2022-09-20 DIAGNOSIS — I70229 Atherosclerosis of native arteries of extremities with rest pain, unspecified extremity: Secondary | ICD-10-CM | POA: Diagnosis present

## 2022-09-20 DIAGNOSIS — I700 Atherosclerosis of aorta: Secondary | ICD-10-CM

## 2022-09-20 DIAGNOSIS — Z9842 Cataract extraction status, left eye: Secondary | ICD-10-CM

## 2022-09-20 DIAGNOSIS — I745 Embolism and thrombosis of iliac artery: Secondary | ICD-10-CM | POA: Diagnosis present

## 2022-09-20 DIAGNOSIS — Z961 Presence of intraocular lens: Secondary | ICD-10-CM | POA: Diagnosis present

## 2022-09-20 DIAGNOSIS — Z8249 Family history of ischemic heart disease and other diseases of the circulatory system: Secondary | ICD-10-CM | POA: Diagnosis not present

## 2022-09-20 DIAGNOSIS — I70221 Atherosclerosis of native arteries of extremities with rest pain, right leg: Principal | ICD-10-CM | POA: Diagnosis present

## 2022-09-20 DIAGNOSIS — Z7902 Long term (current) use of antithrombotics/antiplatelets: Secondary | ICD-10-CM | POA: Diagnosis not present

## 2022-09-20 DIAGNOSIS — I723 Aneurysm of iliac artery: Secondary | ICD-10-CM | POA: Diagnosis not present

## 2022-09-20 DIAGNOSIS — Z880 Allergy status to penicillin: Secondary | ICD-10-CM

## 2022-09-20 HISTORY — DX: Atherosclerosis of native arteries of extremities with rest pain, right leg: I70.221

## 2022-09-20 HISTORY — DX: Unstable angina: I20.0

## 2022-09-20 HISTORY — DX: Essential (primary) hypertension: I10

## 2022-09-20 HISTORY — PX: ENDARTERECTOMY FEMORAL: SHX5804

## 2022-09-20 HISTORY — DX: Other ill-defined heart diseases: I51.89

## 2022-09-20 HISTORY — DX: Long term (current) use of antithrombotics/antiplatelets: Z79.02

## 2022-09-20 HISTORY — PX: INSERTION OF ILIAC STENT: SHX6256

## 2022-09-20 HISTORY — DX: Other intervertebral disc degeneration, lumbar region without mention of lumbar back pain or lower extremity pain: M51.369

## 2022-09-20 HISTORY — DX: Other intervertebral disc degeneration, lumbar region: M51.36

## 2022-09-20 LAB — CBC
HCT: 37.2 % — ABNORMAL LOW (ref 39.0–52.0)
Hemoglobin: 12.4 g/dL — ABNORMAL LOW (ref 13.0–17.0)
MCH: 30.9 pg (ref 26.0–34.0)
MCHC: 33.3 g/dL (ref 30.0–36.0)
MCV: 92.8 fL (ref 80.0–100.0)
Platelets: 168 K/uL (ref 150–400)
RBC: 4.01 MIL/uL — ABNORMAL LOW (ref 4.22–5.81)
RDW: 12.3 % (ref 11.5–15.5)
WBC: 8.8 K/uL (ref 4.0–10.5)
nRBC: 0 % (ref 0.0–0.2)

## 2022-09-20 LAB — ABO/RH: ABO/RH(D): O POS

## 2022-09-20 LAB — CREATININE, SERUM
Creatinine, Ser: 0.89 mg/dL (ref 0.61–1.24)
GFR, Estimated: >60 mL/min

## 2022-09-20 LAB — GLUCOSE, CAPILLARY: Glucose-Capillary: 123 mg/dL — ABNORMAL HIGH (ref 70–99)

## 2022-09-20 SURGERY — ENDARTERECTOMY, FEMORAL
Anesthesia: General | Laterality: Right

## 2022-09-20 MED ORDER — "VISTASEAL 4 ML SINGLE DOSE KIT "
PACK | CUTANEOUS | Status: AC
  Filled 2022-09-20: qty 4

## 2022-09-20 MED ORDER — SODIUM CHLORIDE 0.9 % IV SOLN: INTRAVENOUS | Status: DC

## 2022-09-20 MED ORDER — FAMOTIDINE IN NACL 20-0.9 MG/50ML-% IV SOLN
20.0000 mg | Freq: Two times a day (BID) | INTRAVENOUS | Status: DC
  Administered 2022-09-20 – 2022-09-21 (×2): 20 mg via INTRAVENOUS
  Filled 2022-09-20 (×2): qty 50

## 2022-09-20 MED ORDER — DEXMEDETOMIDINE HCL IN NACL 200 MCG/50ML IV SOLN
INTRAVENOUS | Status: DC | PRN
  Administered 2022-09-20: 12 ug via INTRAVENOUS

## 2022-09-20 MED ORDER — GLYCOPYRROLATE 0.2 MG/ML IJ SOLN
INTRAMUSCULAR | Status: DC | PRN
  Administered 2022-09-20: .2 mg via INTRAVENOUS

## 2022-09-20 MED ORDER — HYDROMORPHONE HCL 1 MG/ML IJ SOLN
INTRAMUSCULAR | Status: DC | PRN
  Administered 2022-09-20 (×2): 1 mg via INTRAVENOUS

## 2022-09-20 MED ORDER — FAMOTIDINE 20 MG PO TABS
20.0000 mg | ORAL_TABLET | Freq: Once | ORAL | Status: AC
  Administered 2022-09-20: 20 mg via ORAL

## 2022-09-20 MED ORDER — PROPOFOL 10 MG/ML IV BOLUS
INTRAVENOUS | Status: DC | PRN
  Administered 2022-09-20: 200 mg via INTRAVENOUS

## 2022-09-20 MED ORDER — HYDROMORPHONE HCL 1 MG/ML IJ SOLN
INTRAMUSCULAR | Status: AC
  Filled 2022-09-20: qty 1

## 2022-09-20 MED ORDER — DEXAMETHASONE SODIUM PHOSPHATE 10 MG/ML IJ SOLN
INTRAMUSCULAR | Status: DC | PRN
  Administered 2022-09-20: 10 mg via INTRAVENOUS

## 2022-09-20 MED ORDER — ENOXAPARIN SODIUM 40 MG/0.4ML IJ SOSY
40.0000 mg | PREFILLED_SYRINGE | INTRAMUSCULAR | Status: DC
  Administered 2022-09-21 – 2022-09-22 (×2): 40 mg via SUBCUTANEOUS
  Filled 2022-09-20 (×2): qty 0.4

## 2022-09-20 MED ORDER — FENTANYL CITRATE (PF) 100 MCG/2ML IJ SOLN
INTRAMUSCULAR | Status: AC
  Filled 2022-09-20: qty 2

## 2022-09-20 MED ORDER — "VISTASEAL 4 ML SINGLE DOSE KIT "
PACK | CUTANEOUS | Status: DC | PRN
  Administered 2022-09-20: 4 mL via TOPICAL

## 2022-09-20 MED ORDER — SORBITOL 70 % SOLN: 30.0000 mL | Freq: Every day | Status: DC | PRN

## 2022-09-20 MED ORDER — SODIUM CHLORIDE 0.9 % IV SOLN: 500.0000 mL | Freq: Once | INTRAVENOUS | Status: DC | PRN

## 2022-09-20 MED ORDER — CHLORHEXIDINE GLUCONATE CLOTH 2 % EX PADS
6.0000 | MEDICATED_PAD | Freq: Once | CUTANEOUS | Status: AC
  Administered 2022-09-20: 6 via TOPICAL

## 2022-09-20 MED ORDER — HYDRALAZINE HCL 20 MG/ML IJ SOLN: 5.0000 mg | INTRAMUSCULAR | Status: DC | PRN

## 2022-09-20 MED ORDER — HEPARIN 30,000 UNITS/1000 ML (OHS) CELLSAVER SOLUTION
Status: AC | PRN
  Administered 2022-09-20: 1

## 2022-09-20 MED ORDER — PHENYLEPHRINE HCL-NACL 20-0.9 MG/250ML-% IV SOLN
INTRAVENOUS | Status: DC | PRN
  Administered 2022-09-20: 30 ug/min via INTRAVENOUS

## 2022-09-20 MED ORDER — HEPARIN 30,000 UNITS/1000 ML (OHS) CELLSAVER SOLUTION
Status: AC
  Filled 2022-09-20: qty 1000

## 2022-09-20 MED ORDER — HEPARIN SODIUM (PORCINE) 5000 UNIT/ML IJ SOLN
INTRAMUSCULAR | Status: AC
  Filled 2022-09-20: qty 1

## 2022-09-20 MED ORDER — ONDANSETRON HCL 4 MG/2ML IJ SOLN: 4.0000 mg | Freq: Four times a day (QID) | INTRAMUSCULAR | Status: DC | PRN

## 2022-09-20 MED ORDER — CLOPIDOGREL BISULFATE 75 MG PO TABS
75.0000 mg | ORAL_TABLET | Freq: Every day | ORAL | Status: DC
  Administered 2022-09-20 – 2022-09-22 (×3): 75 mg via ORAL
  Filled 2022-09-20 (×3): qty 1

## 2022-09-20 MED ORDER — ALUM & MAG HYDROXIDE-SIMETH 200-200-20 MG/5ML PO SUSP: 15.0000 mL | ORAL | Status: DC | PRN

## 2022-09-20 MED ORDER — ACETAMINOPHEN 325 MG PO TABS: 325.0000 mg | ORAL_TABLET | ORAL | Status: DC | PRN

## 2022-09-20 MED ORDER — MORPHINE SULFATE (PF) 2 MG/ML IV SOLN: 2.0000 mg | INTRAVENOUS | Status: DC | PRN

## 2022-09-20 MED ORDER — POTASSIUM CHLORIDE CRYS ER 20 MEQ PO TBCR: 20.0000 meq | EXTENDED_RELEASE_TABLET | Freq: Every day | ORAL | Status: DC | PRN

## 2022-09-20 MED ORDER — ONDANSETRON HCL 4 MG/2ML IJ SOLN
INTRAMUSCULAR | Status: DC | PRN
  Administered 2022-09-20 (×2): 4 mg via INTRAVENOUS

## 2022-09-20 MED ORDER — VANCOMYCIN HCL IN DEXTROSE 1-5 GM/200ML-% IV SOLN
INTRAVENOUS | Status: AC
  Filled 2022-09-20: qty 200

## 2022-09-20 MED ORDER — CHLORHEXIDINE GLUCONATE 0.12 % MT SOLN
15.0000 mL | Freq: Once | OROMUCOSAL | Status: AC
  Administered 2022-09-20: 15 mL via OROMUCOSAL

## 2022-09-20 MED ORDER — ROCURONIUM BROMIDE 100 MG/10ML IV SOLN
INTRAVENOUS | Status: DC | PRN
  Administered 2022-09-20: 50 mg via INTRAVENOUS
  Administered 2022-09-20: 30 mg via INTRAVENOUS
  Administered 2022-09-20: 20 mg via INTRAVENOUS

## 2022-09-20 MED ORDER — HEPARIN SODIUM (PORCINE) 1000 UNIT/ML IJ SOLN
INTRAMUSCULAR | Status: DC | PRN
  Administered 2022-09-20: 2000 [IU] via INTRAVENOUS
  Administered 2022-09-20: 7000 [IU] via INTRAVENOUS

## 2022-09-20 MED ORDER — FENTANYL CITRATE (PF) 100 MCG/2ML IJ SOLN
25.0000 ug | INTRAMUSCULAR | Status: DC | PRN
  Administered 2022-09-20 (×2): 25 ug via INTRAVENOUS

## 2022-09-20 MED ORDER — SUGAMMADEX SODIUM 200 MG/2ML IV SOLN
INTRAVENOUS | Status: DC | PRN
  Administered 2022-09-20: 300 mg via INTRAVENOUS

## 2022-09-20 MED ORDER — ACETAMINOPHEN 650 MG RE SUPP: 325.0000 mg | RECTAL | Status: DC | PRN

## 2022-09-20 MED ORDER — FAMOTIDINE 20 MG PO TABS
ORAL_TABLET | ORAL | Status: AC
  Filled 2022-09-20: qty 1

## 2022-09-20 MED ORDER — LABETALOL HCL 5 MG/ML IV SOLN: 10.0000 mg | INTRAVENOUS | Status: DC | PRN

## 2022-09-20 MED ORDER — CHLORHEXIDINE GLUCONATE CLOTH 2 % EX PADS
6.0000 | MEDICATED_PAD | Freq: Every day | CUTANEOUS | Status: DC
  Administered 2022-09-20 – 2022-09-21 (×2): 6 via TOPICAL

## 2022-09-20 MED ORDER — VANCOMYCIN HCL IN DEXTROSE 1-5 GM/200ML-% IV SOLN
1000.0000 mg | Freq: Two times a day (BID) | INTRAVENOUS | Status: AC
  Administered 2022-09-20 – 2022-09-21 (×2): 1000 mg via INTRAVENOUS
  Filled 2022-09-20 (×3): qty 200

## 2022-09-20 MED ORDER — SODIUM CHLORIDE 0.9 % IV SOLN
INTRAVENOUS | Status: DC | PRN
  Administered 2022-09-20: 501 mL via SURGICAL_CAVITY

## 2022-09-20 MED ORDER — SODIUM CHLORIDE 0.9 % IV SOLN: INTRAVENOUS | Status: DC | PRN

## 2022-09-20 MED ORDER — DOPAMINE-DEXTROSE 3.2-5 MG/ML-% IV SOLN: 3.0000 ug/kg/min | INTRAVENOUS | Status: DC

## 2022-09-20 MED ORDER — VANCOMYCIN HCL IN DEXTROSE 1-5 GM/200ML-% IV SOLN
1000.0000 mg | INTRAVENOUS | Status: AC
  Administered 2022-09-20: 1000 mg via INTRAVENOUS

## 2022-09-20 MED ORDER — PHENYLEPHRINE 80 MCG/ML (10ML) SYRINGE FOR IV PUSH (FOR BLOOD PRESSURE SUPPORT)
PREFILLED_SYRINGE | INTRAVENOUS | Status: DC | PRN
  Administered 2022-09-20: 160 ug via INTRAVENOUS
  Administered 2022-09-20: 80 ug via INTRAVENOUS

## 2022-09-20 MED ORDER — GUAIFENESIN-DM 100-10 MG/5ML PO SYRP: 15.0000 mL | ORAL_SOLUTION | ORAL | Status: DC | PRN

## 2022-09-20 MED ORDER — CHLORHEXIDINE GLUCONATE 0.12 % MT SOLN
OROMUCOSAL | Status: AC
  Filled 2022-09-20: qty 15

## 2022-09-20 MED ORDER — NITROGLYCERIN IN D5W 200-5 MCG/ML-% IV SOLN: 5.0000 ug/min | INTRAVENOUS | Status: DC

## 2022-09-20 MED ORDER — ORAL CARE MOUTH RINSE: 15.0000 mL | Freq: Once | OROMUCOSAL | Status: AC

## 2022-09-20 MED ORDER — OXYCODONE HCL 5 MG PO TABS: 5.0000 mg | ORAL_TABLET | Freq: Once | ORAL | Status: DC | PRN

## 2022-09-20 MED ORDER — METOPROLOL TARTRATE 5 MG/5ML IV SOLN: 2.0000 mg | INTRAVENOUS | Status: DC | PRN

## 2022-09-20 MED ORDER — DOCUSATE SODIUM 100 MG PO CAPS
100.0000 mg | ORAL_CAPSULE | Freq: Every day | ORAL | Status: DC
  Administered 2022-09-21 – 2022-09-22 (×2): 100 mg via ORAL
  Filled 2022-09-20 (×2): qty 1

## 2022-09-20 MED ORDER — MAGNESIUM SULFATE 2 GM/50ML IV SOLN: 2.0000 g | Freq: Every day | INTRAVENOUS | Status: DC | PRN

## 2022-09-20 MED ORDER — OXYCODONE HCL 5 MG/5ML PO SOLN: 5.0000 mg | Freq: Once | ORAL | Status: DC | PRN

## 2022-09-20 MED ORDER — SENNOSIDES-DOCUSATE SODIUM 8.6-50 MG PO TABS: 1.0000 | ORAL_TABLET | Freq: Every evening | ORAL | Status: DC | PRN

## 2022-09-20 MED ORDER — EPHEDRINE SULFATE (PRESSORS) 50 MG/ML IJ SOLN
INTRAMUSCULAR | Status: DC | PRN
  Administered 2022-09-20: 5 mg via INTRAVENOUS

## 2022-09-20 MED ORDER — HEMOSTATIC AGENTS (NO CHARGE) OPTIME
TOPICAL | Status: DC | PRN
  Administered 2022-09-20: 1 via TOPICAL

## 2022-09-20 MED ORDER — LACTATED RINGERS IV SOLN: INTRAVENOUS | Status: DC

## 2022-09-20 MED ORDER — LIDOCAINE HCL (CARDIAC) PF 100 MG/5ML IV SOSY
PREFILLED_SYRINGE | INTRAVENOUS | Status: DC | PRN
  Administered 2022-09-20: 100 mg via INTRAVENOUS

## 2022-09-20 MED ORDER — OXYCODONE-ACETAMINOPHEN 5-325 MG PO TABS: 1.0000 | ORAL_TABLET | ORAL | Status: DC | PRN

## 2022-09-20 MED ORDER — FENTANYL CITRATE (PF) 100 MCG/2ML IJ SOLN
INTRAMUSCULAR | Status: DC | PRN
  Administered 2022-09-20 (×2): 50 ug via INTRAVENOUS

## 2022-09-20 MED ORDER — PHENOL 1.4 % MT LIQD: 1.0000 | OROMUCOSAL | Status: DC | PRN

## 2022-09-20 MED ORDER — ATORVASTATIN CALCIUM 20 MG PO TABS
80.0000 mg | ORAL_TABLET | Freq: Every day | ORAL | Status: DC
  Administered 2022-09-20 – 2022-09-22 (×3): 80 mg via ORAL
  Filled 2022-09-20 (×3): qty 4

## 2022-09-20 SURGICAL SUPPLY — 74 items
APL PRP STRL LF DISP 70% ISPRP (MISCELLANEOUS) ×2
BAG DECANTER FOR FLEXI CONT (MISCELLANEOUS) ×2 IMPLANT
BALLN LUTONIX 7X80X130 (BALLOONS) ×2
BALLN LUTONIX AV 8X60X75 (BALLOONS) ×2
BALLN LUTONIX DCB 5X100X130 (BALLOONS) ×2
BALLOON LUTONIX 7X80X130 (BALLOONS) IMPLANT
BALLOON LUTONIX AV 8X60X75 (BALLOONS) IMPLANT
BALLOON LUTONIX DCB 5X100X130 (BALLOONS) IMPLANT
BLADE SURG 15 STRL LF DISP TIS (BLADE) ×2 IMPLANT
BLADE SURG 15 STRL SS (BLADE) ×2
BLADE SURG SZ11 CARB STEEL (BLADE) ×2 IMPLANT
BOOT SUTURE VASCULAR YLW (MISCELLANEOUS) ×2
BRUSH SCRUB EZ 4% CHG (MISCELLANEOUS) ×2 IMPLANT
CATH ANGIO 5F PIGTAIL 65CM (CATHETERS) IMPLANT
CATH BEACON 5 .035 40 KMP TP (CATHETERS) IMPLANT
CATH BEACON 5 .038 40 KMP TP (CATHETERS) ×2
CHLORAPREP W/TINT 26 (MISCELLANEOUS) ×2 IMPLANT
CLAMP SUTURE YELLOW 5 PAIRS (MISCELLANEOUS) ×2 IMPLANT
DRAPE C-ARM XRAY 36X54 (DRAPES) ×2 IMPLANT
DRAPE INCISE IOBAN 66X45 STRL (DRAPES) ×2 IMPLANT
DRESSING PEEL AND PLC PRVNA 13 (GAUZE/BANDAGES/DRESSINGS) IMPLANT
DRESSING SURGICEL FIBRLLR 1X2 (HEMOSTASIS) ×2 IMPLANT
DRSG PEEL AND PLACE PREVENA 13 (GAUZE/BANDAGES/DRESSINGS) ×2
DRSG SURGICEL FIBRILLAR 1X2 (HEMOSTASIS) ×2
ELECT CAUTERY BLADE 6.4 (BLADE) ×2 IMPLANT
ELECT REM PT RETURN 9FT ADLT (ELECTROSURGICAL) ×2
ELECTRODE REM PT RTRN 9FT ADLT (ELECTROSURGICAL) ×2 IMPLANT
GLIDEWIRE ADV .035X180CM (WIRE) IMPLANT
GLOVE BIO SURGEON STRL SZ7 (GLOVE) ×6 IMPLANT
GOWN STRL REUS W/ TWL LRG LVL3 (GOWN DISPOSABLE) ×4 IMPLANT
GOWN STRL REUS W/ TWL XL LVL3 (GOWN DISPOSABLE) ×4 IMPLANT
GOWN STRL REUS W/TWL LRG LVL3 (GOWN DISPOSABLE) ×4
GOWN STRL REUS W/TWL XL LVL3 (GOWN DISPOSABLE) ×4
GRAFT SKIN WND MICRO 38 (Tissue) IMPLANT
GRAFT VASC PATCH XENOSURE 1X14 (Vascular Products) IMPLANT
IV NS 500ML (IV SOLUTION) ×2
IV NS 500ML BAXH (IV SOLUTION) ×2 IMPLANT
KIT ENCORE 26 ADVANTAGE (KITS) IMPLANT
KIT PREVENA INCISION MGT 13 (CANNISTER) IMPLANT
KIT TURNOVER KIT A (KITS) ×2 IMPLANT
LABEL OR SOLS (LABEL) ×2 IMPLANT
LOOP VESSEL MAXI 1X406 RED (MISCELLANEOUS) ×4 IMPLANT
LOOP VESSEL MINI 0.8X406 BLUE (MISCELLANEOUS) ×4 IMPLANT
MANIFOLD NEPTUNE II (INSTRUMENTS) ×2 IMPLANT
PACK ANGIOGRAPHY (CUSTOM PROCEDURE TRAY) IMPLANT
PACK BASIN MAJOR ARMC (MISCELLANEOUS) ×2 IMPLANT
PACK UNIVERSAL (MISCELLANEOUS) ×2 IMPLANT
SET WALTER ACTIVATION W/DRAPE (SET/KITS/TRAYS/PACK) ×2 IMPLANT
SHEATH BRITE TIP 7FRX11 (SHEATH) IMPLANT
STAPLER SKIN PROX 35W (STAPLE) ×2 IMPLANT
STENT LIFESTAR 9X40 (Permanent Stent) IMPLANT
STENT LIFESTREAM 9X38X80 (Permanent Stent) IMPLANT
STENT VIABAHN 8X100X120 (Permanent Stent) ×2 IMPLANT
STENT VIABAHN 8X10X120 (Permanent Stent) IMPLANT
SUT PROLENE 5 0 RB 1 DA (SUTURE) ×4 IMPLANT
SUT PROLENE 6 0 BV (SUTURE) ×8 IMPLANT
SUT PROLENE 7 0 BV 1 (SUTURE) ×4 IMPLANT
SUT SILK 2 0 (SUTURE) ×2
SUT SILK 2-0 18XBRD TIE 12 (SUTURE) ×2 IMPLANT
SUT SILK 3 0 (SUTURE) ×2
SUT SILK 3-0 18XBRD TIE 12 (SUTURE) ×2 IMPLANT
SUT VIC AB 2-0 CT1 27 (SUTURE) ×4
SUT VIC AB 2-0 CT1 TAPERPNT 27 (SUTURE) ×4 IMPLANT
SUT VIC AB 3-0 SH 27 (SUTURE) ×2
SUT VIC AB 3-0 SH 27X BRD (SUTURE) ×2 IMPLANT
SUT VICRYL+ 3-0 36IN CT-1 (SUTURE) ×4 IMPLANT
SYR 20ML LL LF (SYRINGE) ×2 IMPLANT
SYR 5ML LL (SYRINGE) ×2 IMPLANT
TAG SUTURE CLAMP YLW 5PR (MISCELLANEOUS) ×2
TRAP FLUID SMOKE EVACUATOR (MISCELLANEOUS) ×2 IMPLANT
TRAY FOLEY MTR SLVR 16FR STAT (SET/KITS/TRAYS/PACK) ×2 IMPLANT
WATER STERILE IRR 500ML POUR (IV SOLUTION) ×2 IMPLANT
WIRE G 018X200 V18 (WIRE) IMPLANT
WIRE GUIDERIGHT .035X150 (WIRE) IMPLANT

## 2022-09-20 NOTE — Anesthesia Procedure Notes (Signed)
Procedure Name: Intubation Date/Time: 09/20/2022 11:46 AM  Performed by: Mohammed Kindle, CRNAPre-anesthesia Checklist: Patient identified, Emergency Drugs available, Suction available and Patient being monitored Patient Re-evaluated:Patient Re-evaluated prior to induction Oxygen Delivery Method: Circle system utilized Preoxygenation: Pre-oxygenation with 100% oxygen Induction Type: IV induction Ventilation: Mask ventilation without difficulty Laryngoscope Size: 3 and McGraph Grade View: Grade I Tube type: Oral Tube size: 7.0 mm Number of attempts: 1 Airway Equipment and Method: Stylet and Oral airway Placement Confirmation: ETT inserted through vocal cords under direct vision, positive ETCO2, breath sounds checked- equal and bilateral and CO2 detector Secured at: 21 cm Tube secured with: Tape Dental Injury: Teeth and Oropharynx as per pre-operative assessment

## 2022-09-20 NOTE — Interval H&P Note (Signed)
History and Physical Interval Note:  09/20/2022 11:33 AM  Aaron Knox  has presented today for surgery, with the diagnosis of ASO WITH REST PAIN.  The various methods of treatment have been discussed with the patient and family. After consideration of risks, benefits and other options for treatment, the patient has consented to  Procedure(s): ENDARTERECTOMY FEMORAL (Right) INSERTION OF ILIAC STENT (Right) APPLICATION OF CELL SAVER (N/A) as a surgical intervention.  The patient's history has been reviewed, patient examined, no change in status, stable for surgery.  I have reviewed the patient's chart and labs.  Questions were answered to the patient's satisfaction.     Festus Barren

## 2022-09-20 NOTE — Transfer of Care (Signed)
Immediate Anesthesia Transfer of Care Note  Patient: Aaron Knox  Procedure(s) Performed: ENDARTERECTOMY FEMORAL (Right) INSERTION OF ILIAC STENT (Right) APPLICATION OF CELL SAVER  Patient Location: PACU  Anesthesia Type:General  Level of Consciousness: drowsy and patient cooperative  Airway & Oxygen Therapy: Patient Spontanous Breathing and Patient connected to face mask oxygen  Post-op Assessment: Report given to RN and Post -op Vital signs reviewed and stable  Post vital signs: Reviewed and stable  Last Vitals:  Vitals Value Taken Time  BP 153/88 09/20/22 1515  Temp    Pulse 51 09/20/22 1517  Resp 14 09/20/22 1517  SpO2 100 % 09/20/22 1517  Vitals shown include unvalidated device data.  Last Pain:  Vitals:   09/20/22 1039  TempSrc: Oral  PainSc: 0-No pain      Patients Stated Pain Goal: 0 (09/20/22 1039)  Complications: No notable events documented.

## 2022-09-20 NOTE — Anesthesia Preprocedure Evaluation (Signed)
Anesthesia Evaluation  Patient identified by MRN, date of birth, ID band Patient awake    Reviewed: Allergy & Precautions, NPO status , Patient's Chart, lab work & pertinent test results  Airway Mallampati: I  TM Distance: >3 FB Neck ROM: full    Dental  (+) Dental Advidsory Given, Edentulous Lower, Edentulous Upper   Pulmonary neg pulmonary ROS, neg shortness of breath, Patient abstained from smoking., former smoker   Pulmonary exam normal        Cardiovascular hypertension, (-) angina + CAD, + Past MI, + Peripheral Vascular Disease and +CHF  Normal cardiovascular exam     Neuro/Psych negative neurological ROS  negative psych ROS   GI/Hepatic negative GI ROS, Neg liver ROS,,,  Endo/Other  negative endocrine ROS    Renal/GU      Musculoskeletal   Abdominal   Peds  Hematology negative hematology ROS (+)   Anesthesia Other Findings Past Medical History: No date: 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) No date: CAD in native artery     Comment:  a.) LHC/PCI 08/09/2014 (setting of STEMI): 30% mLAD, 99%              pLCx (3.0 x 15 mm Resolute Integrity DES x 1 with 10%               residual post-intervention stenosis), 45% mLCx, 20% pRCA,              40% dRCA); b.) LHC 01/29/2018: 30% mLAD, 45% mLCx, 50%               pRCA, 80% dRCA --> med mgmt No date: DDD (degenerative disc disease), lumbar No date: Diastolic dysfunction     Comment:  a.) TTE 08/11/2014 (s/p STEMI): EF 45-50%, inf HK, AoV               sclerosis, G1DD; b.) TTE 02/28/2018: EF 55-60%, G1DD 2023: History of bilateral cataract extraction No date: HTN (hypertension) No date: Hyperlipidemia No date: Long term current use of antithrombotics/antiplatelets     Comment:  a.) clopidogrel 08/09/2014: ST elevation myocardial infarction (STEMI) of true  posterior wall (HCC)     Comment:  a.) LHC/PCI 08/09/2014: 3.0 x 15 mm Resolute  Integrity               DES x 1 pLCx with 10% residual post-intervention               stenosis) No date: Unstable angina (HCC)  Past Surgical History: 08/09/2014: CARDIAC CATHETERIZATION; N/A     Comment:  Procedure: Left Heart Cath and Coronary Angiography;                Surgeon: Iran Ouch, MD;  [facility]: [facility] INVASIVE CV               LAB;  Service: Cardiovascular;  Laterality: N/A; 08/09/2014: CARDIAC CATHETERIZATION; N/A     Comment:  Procedure: Coronary Stent Intervention;  Surgeon:               Iran Ouch, MD;  Location: [facility] INVASIVE CV LAB;                Service: Cardiovascular;  Laterality: N/A; 2023: CATARACT EXTRACTION W/ INTRAOCULAR LENS  IMPLANT, BILATERAL;  Bilateral 08/09/2014: CORONARY ANGIOPLASTY 1980: HAND SURGERY; Left     Comment:  bone chip with nerve damage 01/29/2018: LEFT HEART CATH AND CORONARY ANGIOGRAPHY; N/A     Comment:  Procedure: LEFT HEART CATH AND CORONARY ANGIOGRAPHY;                Surgeon: Yvonne Kendall, MD;  Location: [facility] INVASIVE               CV LAB;  Service: Cardiovascular;  Laterality: N/A; 08/07/2022: LOWER EXTREMITY ANGIOGRAPHY; Right     Comment:  Procedure: Lower Extremity Angiography;  Surgeon: Annice Needy, MD;  Location: [facility] INVASIVE CV LAB;  Service:               Cardiovascular;  Laterality: Right; 1980: nerve reconstruction; Left     Comment:  hand     Reproductive/Obstetrics negative OB ROS                             Anesthesia Physical Anesthesia Plan  ASA: 3  Anesthesia Plan: General ETT   Post-op Pain Management:    Induction: Intravenous  PONV Risk Score and Plan: 2 and Ondansetron, Midazolam and Dexamethasone  Airway Management Planned: Oral ETT  Additional Equipment: Arterial line  Intra-op Plan:   Post-operative Plan: Extubation in OR  Informed Consent: I have reviewed the patients History and Physical, chart, labs and discussed the procedure  including the risks, benefits and alternatives for the proposed anesthesia with the patient or authorized representative who has indicated his/her understanding and acceptance.     Dental Advisory Given  Plan Discussed with: Anesthesiologist, CRNA and Surgeon  Anesthesia Plan Comments: (Patient consented for risks of anesthesia including but not limited to:  - adverse reactions to medications - damage to eyes, teeth, lips or other oral mucosa - nerve damage due to positioning  - sore throat or hoarseness - Damage to heart, brain, nerves, lungs, other parts of body or loss of life  Patient voiced understanding.)        Anesthesia Quick Evaluation

## 2022-09-20 NOTE — Op Note (Addendum)
OPERATIVE NOTE   PROCEDURE: 1.   Right common femoral and superficial femoral artery endarterectomies and patch angioplasty 2.  Aortogram and right iliofemoral angiogram 3.  Stent placement to the right common iliac artery with 9 mm diameter by 37 mm length Lifestream stent 4.  Stent placement to the distal right common iliac artery and proximal right external iliac artery with 9 mm diameter by 40 mm length life star stent 5.  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  significant plaque in right common femoral and superficial femoral arteries 2.  Common iliac artery stenosis of about 60% on the right, right external iliac artery is occluded  SPECIMEN(S):  Right common femoral and superficial femoral artery plaque.  INDICATIONS:    Patient presents with rest pain of the right lower extremity with a known right common femoral and iliac artery occlusion.  Right femoral endarterectomy and right iliac intervention is planned to try to improve perfusion.  The risks and benefits as well as alternative therapies including intervention were reviewed in detail all questions were answered the patient agrees to proceed with surgery.  DESCRIPTION: After obtaining full informed written consent, the patient was brought back to the operating room and placed supine upon the operating table.  The patient received IV antibiotics prior to induction.  After obtaining adequate anesthesia, the patient was prepped and draped in the standard fashion appropriate time out is called.    Vertical incision was created overlying the right femoral arteries. The common femoral artery proximally,  and superficial femoral artery, and primary profunda femoris artery branches were encircled with vessel loops and prepared for control. The right femoral arteries were found to have significant plaque from the common femoral artery into the profunda and superficial femoral arteries.   7000 units of heparin was given and allowed circulate for 5 minutes.  Additional heparin was given as needed during the procedure  Attention is then turned to the right femoral artery.  An arteriotomy is made with 11 blade and extended with Potts scissors in the common femoral artery and carried down onto the first 3-4 cm of the superficial femoral artery. An endarterectomy was then performed. The [facility] was used to create a plane. The proximal endpoint was cut flush with tenotomy scissors. This was in the proximal common femoral artery. An eversion endarterectomy was then performed for the origin of the profunda femoris artery.  The distal endpoint of the superficial femoral endarterectomy was created with gentle traction and the distal endpoint was clean and feathered.  The bovine pericardial patcth is then selected and prepared for a patch angioplasty.  It is cut and beveled and started at the proximal endpoint with a 6-0 Prolene suture.  Approximately one half of the suture line is run medially and laterally and the distal end point was cut and bevelled to match the arteriotomy.  A second 6-0 Prolene was started at the distal end point and run to the mid portion to complete the arteriotomy.  The vessel was flushed prior to release of control and completion of the anastomosis.  At this point, flow was established first to the profunda femoris artery and then to the superficial femoral  artery.  I then turned my attention to the endovascular portion of the procedure.  The patch was accessed with a Seldinger needle and a 7 French sheath was placed.  Imaging is performed showing occlusion of the right external iliac  artery.  I then used a Kumpe catheter and advantage wire and cross this occlusion without difficulty getting the catheter up into the aorta.  Imaging showed mild disease of the distal aorta.  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  The left common and external iliac arteries did not have significant stenosis.  There was about a 60% stenosis in the distal portion of the right common iliac artery.  There was a large right hypogastric artery.  The right external iliac artery was then occluded.  I then proceeded with treatment.  We exchanged for a V18 wire.  A 9 mm diameter by 37 mm length Lifestream stent was deployed in the right common iliac artery above the hypogastric artery to treat the stenosis.  I then bridged over the hypogastric artery with a 9 mm diameter by 4 cm length life star stent.  Finally an 8 mm diameter by 10 cm length Viabahn stent was deployed in the right external iliac artery down to the top of the femoral head at the top of our endarterectomy patch.  The external iliac artery was postdilated with a 7 mm balloon and the life star stent was postdilated with an 8 mm diameter Lutonix drug-coated balloon.  Completion imaging showed less than 10% residual stenosis in the right common and external iliac arteries after stent placement. The sheath was removed with a 5-0 Prolene pursestring suture placed around the site for hemostasis.  6-0 Prolene patch sutures were used for hemostasis as needed on the arterial wall and suture line.  Fibrillar and Vistacel topical hemostatic agents were placed in the femoral incision and hemostasis was complete. The femoral incision was then closed in a layered fashion with 2 layers of 2-0 Vicryl, 2 layers of 3-0 Vicryl, and staples with an incisional (disposable) VAC for the skin closure.  Occlusive seal was obtained with dressing and suction tubing.  The patient was then awakened from anesthesia and taken to the recovery room in stable  condition having tolerated the procedure well.  COMPLICATIONS: None  CONDITION: Stable     Festus Barren 09/20/2022 3:34 PM   This note was created with Dragon Medical transcription system. Any errors in dictation are purely unintentional.

## 2022-09-20 NOTE — Op Note (Signed)
OPERATIVE NOTE   PROCEDURE: Right common femoral and superficial femoral endarterectomy with bovine pericardial patch angioplasty. Open angioplasty and stent placement of the right common iliac artery. Open angioplasty and stent placement of the right external iliac artery. Placement of a disposable Prevena VAC right groin wound.  PRE-OPERATIVE DIAGNOSIS: Atherosclerotic occlusive disease right lower extremity with rest pain symptoms  POST-OPERATIVE DIAGNOSIS: Same  CO-SURGEON: Renford Dills, MD and Annice Needy, M.D.  ASSISTANT(S): None  ANESTHESIA: general  ESTIMATED BLOOD LOSS: 600 cc  FINDING(S): Profound calcific plaque noted of the right common femoral extending  down the extensive length of the SFA. Occlusion of the right external iliac artery. Greater than 60% stenosis of the right common iliac artery  SPECIMEN(S):  Calcific plaque from the common femoral, superficial femoral and the profunda femoris artery  INDICATIONS:   Aaron Knox 67 y.o. y.o.male who presents with complaints of lifestyle limiting claudication and pain continuously in the right lower extremity. The patient has documented severe atherosclerotic occlusive disease and has undergone minimally invasive treatments in the past. However, at this point his primary area of stricture stenosis resides in the common femoral and origins of the superficial femoral and profunda femoris extending into these arteries and therefore this is not amenable to intervention. The patient is therefore undergoing open endarterectomy. The risks and benefits of surgery have been reviewed with the patient, all questions have answered; alternative therapies have been reviewed as well and the patient has agreed to proceed with surgical open repair.  DESCRIPTION: After obtaining full informed written consent, the patient was brought back to the operating room and placed supine upon  the operating table.  The patient received IV antibiotics prior to induction.  After obtaining adequate anesthesia, the patient was prepped and draped in the standard fashion for right femoral exposure.  Co-surgeons are required because this is a complicated procedure with work being performed simultaneously.  This also expedites the procedure making a shorter operative time reducing complications and improving patient safety.  Attention was turned to the right groin with Dr. Wyn Quaker working on the patient's right and myself working on the left of the patient.  Vertical  Incision was made over the right common femoral artery and dissection carried down to the common femoral artery with electrocautery.  I dissected out the common femoral artery from the distal external iliac artery (identified by the superficial circumflex vessels) down to the femoral bifurcation.  On initial inspection, the common femoral artery was: densely calcified and there was no palpable pulse noted.    Subsequently the dissection was continued to include all circumflex branches and the profunda femoral artery and superficial femoral artery. The superficial femoral artery was dissected circumferentially for a distance of approximately 3-4 cm and the profunda femoris was dissected circumferentially out to the fourth order branches individual vessel loops were placed around each branch.  Control of all branches was obtained with vessel loops.  A softer area in the distal external iliac artery amendable to clamping was identified.    The patient was given 5000 units of Heparin intravenously, which was a therapeutic bolus.   After waiting 3 minutes, the distal external iliac artery was clamped and all of the vessel loops were placed under tension.  Arteriotomy was made in the common femoral artery with a 11-blade and extended it with a Potts scissor proximally and distally extending the distal end down the SFA for approximately 3 cm.    Endarterectomy was then performed  under direct visualization using a freer elevator and a right angle from the mid common femoral extending up both proximally and distally. Proximally the endarterectomy was brought up to the level of the clamp where a clean edge was obtained. Distally the endarterectomy was carried down to a soft spot in the SFA where a feathered edge would was obtained.  7-0 Prolene interrupted tacking sutures were placed to secure the leading edge of the plaque in the SFA.  The profunda femoris was treated with an eversion technique extending endarterectomy approximately 2 cm distally again obtaining a featheredge on both sides right and left.   At this point, a corematrix patch was fashioned for the geometry of the arteriotomy.  The patch was sewn to the artery with 2 running stitches of 6-0 Prolene, running from each end.  Prior to completing the patch angioplasty, the profunda femoral artery was flushed as was the superficial femoral artery. The system was then forward flushed. The endarterectomy site was then irrigated copiously with heparinized saline. The patch angioplasty was completed in the usual fashion.  Flow was then reestablished first to the profunda femoris and then the superficial femoral artery.    At this point we began the interventional portion.  This is Dr. Wyn Quaker as the primary and myself as the assistant.  Using a Seldinger needle the center of the patch was accessed and J-wire advanced.  7 French sheath was placed without difficulty.  Using a Kumpe catheter and an advantage wire the occlusion in the external iliac is crossed and the catheter is advanced into the distal aorta.  Imaging was then performed which demonstrated mild disease in the distal aorta a greater than 60% stenosis in the common iliac (in the more distal segment) and occlusion of the external iliac down to the femoral head.  The hypogastric on the right was quite large and widely patent.  The wire  was then reintroduced initially a 9 mm x 37 mm Lifestream stent was deployed across the right common iliac artery lesion this was then bridged into the external iliac artery with a 9 mm x 40 mm life star stent which was postdilated to 8 mm.  A V18 wire was utilized and lastly an 8 mm x 10 cm Viabahn stent was deployed so that the distal LAD was at the femoral head essentially extending into the endarterectomy segment by approximately 4 mm.  Proximally the Viabahn stent overlapped the life star stent by 10 to 15 mm.  This was then postdilated with an 7 mm balloon.  Follow-up imaging now demonstrated wide patency from the aorta down to the femoral reconstruction with less than 10% residual stenosis.  A pursestring suture of 5-0 Prolene was then placed around the 7 [place] sheath and 7 French sheath removed.  Flow was now reestablished first to the profunda femoris and the SFA.  Any gaps or bleeding sites in the patch suture line were easily controlled with a 6-0 Prolene suture.  The right groin was then irrigated copiously with sterile saline and subsequently Vistaseal and fibrillar were were placed in the wound. The incision was repaired with a double layer of 2-0 Vicryl, a double layer of 3-0 Vicryl.  Skin was reapproximated with staples.  A disposable Prevena VAC was then placed over the right groin wound and hooked to suction.     COMPLICATIONS: None  CONDITION: Aaron Knox, M.D. [facility] Office: [phone]  09/20/2022, 4:40 PM

## 2022-09-21 ENCOUNTER — Encounter: Payer: Self-pay | Admitting: Vascular Surgery

## 2022-09-21 LAB — CBC
HCT: 34.4 % — ABNORMAL LOW (ref 39.0–52.0)
Hemoglobin: 11.4 g/dL — ABNORMAL LOW (ref 13.0–17.0)
MCH: 30.8 pg (ref 26.0–34.0)
MCHC: 33.1 g/dL (ref 30.0–36.0)
MCV: 93 fL (ref 80.0–100.0)
Platelets: 159 10*3/uL (ref 150–400)
RBC: 3.7 MIL/uL — ABNORMAL LOW (ref 4.22–5.81)
RDW: 12.5 % (ref 11.5–15.5)
WBC: 7.8 10*3/uL (ref 4.0–10.5)
nRBC: 0 % (ref 0.0–0.2)

## 2022-09-21 LAB — BASIC METABOLIC PANEL
Anion gap: 8 (ref 5–15)
BUN: 10 mg/dL (ref 8–23)
CO2: 23 mmol/L (ref 22–32)
Calcium: 8.5 mg/dL — ABNORMAL LOW (ref 8.9–10.3)
Chloride: 107 mmol/L (ref 98–111)
Creatinine, Ser: 0.91 mg/dL (ref 0.61–1.24)
GFR, Estimated: >60 mL/min (ref >60)
Glucose, Bld: 160 mg/dL — ABNORMAL HIGH (ref 70–99)
Potassium: 4 mmol/L (ref 3.5–5.1)
Sodium: 138 mmol/L (ref 135–145)

## 2022-09-21 MED ORDER — FAMOTIDINE 20 MG PO TABS
20.0000 mg | ORAL_TABLET | Freq: Two times a day (BID) | ORAL | Status: DC
  Administered 2022-09-21 – 2022-09-22 (×2): 20 mg via ORAL
  Filled 2022-09-21 (×2): qty 1

## 2022-09-21 NOTE — Progress Notes (Addendum)
Patient concerned about some numbness to right knee/upper thigh, however when I have patient close his eye he can feel when I touch. Per patient now having some tingling in right leg. Skin warm to touch. Right dorsalis pedis x2, with good capillary refill. Made Rolla Plate NP to make aware, will continue to monitor. Dr. Wyn Quaker and Rolla Plate came to floor to assess patient. No new orders

## 2022-09-21 NOTE — Evaluation (Signed)
Physical Therapy Evaluation Patient Details Name: Aaron Knox MRN: [id number] DOB: 1956/03/31 Today's Date: 09/21/2022  History of Present Illness  67 y/o male s/p R femoral endarterectomy and insertion of iliac stent on 09/20/22. PMH: PAD, CAD  Clinical Impression  Patient admitted with the above. PTA, patient lives alone and reports independence with mobility but reports numerous falls in past 6 months. Able to complete all mobility with supervision and utilized RW for longer distance for safety. No further skilled PT needs identified acutely. Will defer further mobility to mobility specialists and nursing staff.        Recommendations for follow up therapy are one component of a multi-disciplinary discharge planning process, led by the attending physician.  Recommendations may be updated based on patient status, additional functional criteria and insurance authorization.  Follow Up Recommendations       Assistance Recommended at Discharge PRN  Patient can return home with the following  Help with stairs or ramp for entrance;Assist for transportation    Equipment Recommendations Rolling Rambo Sarafian (2 wheels)  Recommendations for Other Services       Functional Status Assessment Patient has had a recent decline in their functional status and demonstrates the ability to make significant improvements in function in a reasonable and predictable amount of time.     Precautions / Restrictions Precautions Precautions: Fall Restrictions Weight Bearing Restrictions: No      Mobility  Bed Mobility Overal bed mobility: Modified Independent                  Transfers Overall transfer level: Independent                      Ambulation/Gait Ambulation/Gait assistance: Supervision Gait Distance (Feet): 200 Feet Assistive device: Rolling Gordan Grell (2 wheels), None Gait Pattern/deviations: Step-through pattern, Decreased stride length Gait velocity: decreased      General Gait Details: supervision for safety for initial mobility but moving well with no LOB. Ambulated in room with no AD  Stairs            Wheelchair Mobility    Modified Rankin (Stroke Patients Only)       Balance Overall balance assessment: No apparent balance deficits (not formally assessed)                                           Pertinent Vitals/Pain Pain Assessment Pain Assessment: Faces Faces Pain Scale: Hurts a little bit Pain Location: R groin Pain Descriptors / Indicators: Discomfort, Grimacing Pain Intervention(s): Limited activity within patient's tolerance, Monitored during session    Home Living Family/patient expects to be discharged to:: Private residence Living Arrangements: Alone Available Help at Discharge: Neighbor;Available PRN/intermittently Type of Home: House Home Access: Stairs to enter   [employer] of Steps: 1   Home Layout: One level Home Equipment: Shower seat;Grab bars - tub/shower      Prior Function Prior Level of Function : Independent/Modified Independent;Driving             Mobility Comments: reports too many falls to count       Hand Dominance        Extremity/Trunk Assessment   Upper Extremity Assessment Upper Extremity Assessment: Defer to OT evaluation    Lower Extremity Assessment Lower Extremity Assessment: Generalized weakness RLE Deficits / Details: decreased hip flexion r/t pain  Communication   Communication: No difficulties  Cognition Arousal/Alertness: Awake/alert Behavior During Therapy: WFL for tasks assessed/performed Overall Cognitive Status: Within Functional Limits for tasks assessed                                          General Comments      Exercises     Assessment/Plan    PT Assessment Patient does not need any further PT services  PT Problem List         PT Treatment Interventions      PT Goals (Current goals  can be found in the Care Plan section)  Acute Rehab PT Goals Patient Stated Goal: to go home PT Goal Formulation: With patient    Frequency       Co-evaluation               AM-PAC PT "6 Clicks" Mobility  Outcome Measure Help needed turning from your back to your side while in a flat bed without using bedrails?: None Help needed moving from lying on your back to sitting on the side of a flat bed without using bedrails?: None Help needed moving to and from a bed to a chair (including a wheelchair)?: None Help needed standing up from a chair using your arms (e.g., wheelchair or bedside chair)?: None Help needed to walk in hospital room?: A Little Help needed climbing 3-5 steps with a railing? : A Little 6 Click Score: 22    End of Session   Activity Tolerance: Patient tolerated treatment well Patient left: in bed;with call bell/phone within reach;Other (comment) (handoff to OT) Nurse Communication: Mobility status PT Visit Diagnosis: Muscle weakness (generalized) (M62.81);Unsteadiness on feet (R26.81)    Time: 0981-1914 PT Time Calculation (min) (ACUTE ONLY): 23 min   Charges:   PT Evaluation $PT Eval Low Complexity: 1 Low PT Treatments $Therapeutic Activity: 8-22 mins        Maylon Peppers, PT, DPT Physical Therapist - [facility]  [facility]   Nayeliz Hipp A Yasin Ducat 09/21/2022, 11:59 AM

## 2022-09-21 NOTE — Evaluation (Signed)
Occupational Therapy Evaluation Patient Details Name: Aaron Knox MRN: [id number] DOB: 07/29/1955 Today's Date: 09/21/2022   History of Present Illness 67 y/o male s/p R femoral endarterectomy and insertion of iliac stent on 09/20/22. PMH: PAD, CAD   Clinical Impression   [name] was seen for OT evaluation this date. Prior to hospital admission, pt was IND however reports falls hx. Pt lives alone with neighbours available as needed. Pt currently requires MOD A don R sock 2/2 groin pain/stiffness. SUPERVISION for ADL t/f no AD use and funcitonal reaching tasks placing items at above head and knee height. Educated on falls prevention and DME recs, all education complete, will sign off. Upon hospital discharge, recommend no OT follow up.   Recommendations for follow up therapy are one component of a multi-disciplinary discharge planning process, led by the attending physician.  Recommendations may be updated based on patient status, additional functional criteria and insurance authorization.   Assistance Recommended at Discharge Set up Supervision/Assistance  Patient can return home with the following Help with stairs or ramp for entrance    Functional Status Assessment  Patient has had a recent decline in their functional status and demonstrates the ability to make significant improvements in function in a reasonable and predictable amount of time.  Equipment Recommendations  None recommended by OT    Recommendations for Other Services       Precautions / Restrictions Precautions Precautions: Fall Restrictions Weight Bearing Restrictions: No      Mobility Bed Mobility Overal bed mobility: Modified Independent                  Transfers Overall transfer level: Independent                        Balance Overall balance assessment: No apparent balance deficits (not formally assessed)                                         ADL  either performed or assessed with clinical judgement   ADL Overall ADL's : Needs assistance/impaired                                       General ADL Comments: MOD A don R sock 2/2 groin pain/stiffness. SUPERVISION for ADL t/f no AD use and funcitonal reaching tasks placing items at above head and knee height.      Pertinent Vitals/Pain Pain Assessment Pain Assessment: Faces Faces Pain Scale: Hurts a little bit Pain Location: R groin Pain Descriptors / Indicators: Discomfort, Grimacing Pain Intervention(s): Limited activity within patient's tolerance, Repositioned     Hand Dominance     Extremity/Trunk Assessment Upper Extremity Assessment Upper Extremity Assessment: Overall WFL for tasks assessed   Lower Extremity Assessment Lower Extremity Assessment: RLE deficits/detail RLE Deficits / Details: decreased hip flexion r/t pain       Communication Communication Communication: No difficulties   Cognition Arousal/Alertness: Awake/alert Behavior During Therapy: WFL for tasks assessed/performed Overall Cognitive Status: Within Functional Limits for tasks assessed  Home Living Family/patient expects to be discharged to:: Private residence Living Arrangements: Alone Available Help at Discharge: Neighbor;Available PRN/intermittently Type of Home: House Home Access: Stairs to enter [employer] of Steps: 1   Home Layout: One level     Bathroom Shower/Tub: Runner, broadcasting/film/video: Shower seat;Grab bars - tub/shower          Prior Functioning/Environment Prior Level of Function : Independent/Modified Independent;Driving             Mobility Comments: reports too many falls to count          OT Problem List: Decreased range of motion         OT Goals(Current goals can be found in the care plan section) Acute Rehab OT Goals Patient Stated  Goal: to go home OT Goal Formulation: With patient Time For Goal Achievement: 10/05/22 Potential to Achieve Goals: Good   AM-PAC OT "6 Clicks" Daily Activity     Outcome Measure Help from another person eating meals?: None Help from another person taking care of personal grooming?: None Help from another person toileting, which includes using toliet, bedpan, or urinal?: None Help from another person bathing (including washing, rinsing, drying)?: A Little Help from another person to put on and taking off regular upper body clothing?: None Help from another person to put on and taking off regular lower body clothing?: A Lot 6 Click Score: 21   End of Session    Activity Tolerance: Patient tolerated treatment well Patient left: in bed;with call bell/phone within reach  OT Visit Diagnosis: Unsteadiness on feet (R26.81)                Time: 1610-9604 OT Time Calculation (min): 21 min Charges:  OT General Charges $OT Visit: 1 Visit OT Evaluation $OT Eval Moderate Complexity: 1 Mod  Kathie Dike, M.S. OTR/L  09/21/22, 10:09 AM  ascom [phone]

## 2022-09-21 NOTE — Anesthesia Postprocedure Evaluation (Signed)
Anesthesia Post Note  Patient: Aaron Knox  Procedure(s) Performed: ENDARTERECTOMY FEMORAL (Right) INSERTION OF ILIAC STENT (Right) APPLICATION OF CELL SAVER  Patient location during evaluation: ICU Anesthesia Type: General Level of consciousness: awake and alert Pain management: pain level controlled Vital Signs Assessment: post-procedure vital signs reviewed and stable Respiratory status: spontaneous breathing, nonlabored ventilation, respiratory function stable and patient connected to nasal cannula oxygen Cardiovascular status: blood pressure returned to baseline and stable Postop Assessment: no apparent nausea or vomiting Anesthetic complications: no  No notable events documented.   Last Vitals:  Vitals:   09/21/22 0500 09/21/22 0600  BP: 129/68 (!) 122/48  Pulse: (!) 59 73  Resp: 11 17  Temp:    SpO2: 98% 98%    Last Pain:  Vitals:   09/21/22 0400  TempSrc: Oral  PainSc: 0-No pain                 Starling Manns

## 2022-09-21 NOTE — Progress Notes (Signed)
  Progress Note    09/21/2022 8:09 AM 1 Day Post-Op  Subjective:  Aaron Knox is a 67 yo male who is now POD #1 from right lower extremity Femoral Endarterectomy. 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  Patient denies any chest pain, shortness of breath, dizziness, nausea, vomiting, or diarrhea.  On exam this morning patient is resting comfortably.  Patient endorses his right lower extremity feels warmer and better today.  Patient endorses feeling good this morning.  Patient is asking if he can get up and ambulate.  No complaints overnight vitals stable.   Vitals:   09/21/22 0730 09/21/22 0757  BP: 128/72   Pulse: 72   Resp: 16   Temp:  97.6 F (36.4 C)  SpO2: 97%    Physical Exam: Cardiac:  RRR, normal S1, S2.  No rubs clicks or gallops. Lungs: Clear throughout on auscultation but slightly diminished in the bases bilaterally. Incisions: Right groin incision covered with Prevena wound VAC.  Working well no signs or symptoms of hematoma seroma or infection. Extremities: Right lower extremity has palpable DP/PT pulses this morning.  Lower extremity is warm to touch. Abdomen: Positive bowel sounds throughout, soft, flat, nontender and nondistended. Neurologic: Alert and oriented x 3 and follows commands.  Answers all questions appropriately.  CBC    Component Value Date/Time   WBC 7.8 09/21/2022 0015   RBC 3.70 (L) 09/21/2022 0015   HGB 11.4 (L) 09/21/2022 0015   HCT 34.4 (L) 09/21/2022 0015   PLT 159 09/21/2022 0015   MCV 93.0 09/21/2022 0015   MCH 30.8 09/21/2022 0015   MCHC 33.1 09/21/2022 0015   RDW 12.5 09/21/2022 0015    BMET    Component Value Date/Time   NA 138 09/21/2022 0015   K 4.0 09/21/2022 0015   CL 107 09/21/2022 0015   CO2 23 09/21/2022 0015   GLUCOSE 160 (H) 09/21/2022 0015   BUN 10 09/21/2022 0015   CREATININE 0.91 09/21/2022 0015    CALCIUM 8.5 (L) 09/21/2022 0015   GFRNONAA >60 09/21/2022 0015   GFRAA >60 01/30/2018 0408    INR No results found for: "INR"   Intake/Output Summary (Last 24 hours) at 09/21/2022 0809 Last data filed at 09/21/2022 0805 Gross per 24 hour  Intake 2544.34 ml  Output 1650 ml  Net 894.34 ml     Assessment/Plan:  67 y.o. male is s/p Right lower extremity Femoral Endarterectomy 1 Day Post-Op   PLAN: Advance diet as tolerated to regular diet PT/OT  Pain control PRN OOB with Assistance today Transfer to Floor later today  DVT prophylaxis:  Plavix 75 mg daily and Lovenox 40 mg q24   Marcie Bal [facility] 09/21/2022 8:09 AM

## 2022-09-22 ENCOUNTER — Encounter: Payer: Self-pay | Admitting: Vascular Surgery

## 2022-09-22 LAB — SURGICAL PATHOLOGY

## 2022-09-22 MED ORDER — OXYCODONE-ACETAMINOPHEN 5-325 MG PO TABS: 1.0000 | ORAL_TABLET | ORAL | 0 refills | Status: DC | PRN

## 2022-09-22 NOTE — Discharge Summary (Signed)
[facility]    Discharge Summary    Patient ID:  Aaron Knox MRN: [id number] DOB/AGE: 69-Jul-1957 69 y.o.  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)    CAD in native artery    a.) LHC/PCI 08/09/2014 (setting of STEMI): 30% mLAD, 99% pLCx (3.0 x 15 mm Resolute Integrity DES x 1 with 10% residual post-intervention stenosis), 45% mLCx, 20% pRCA, 40% dRCA); b.) LHC 01/29/2018: 30% mLAD, 45% mLCx, 50% pRCA, 80% dRCA --> med mgmt   DDD (degenerative disc disease), lumbar    Diastolic dysfunction    a.) TTE 08/11/2014 (s/p STEMI): EF 45-50%, inf HK, AoV sclerosis, G1DD; b.) TTE 02/28/2018: EF 55-60%, G1DD   History of bilateral cataract extraction 2023   HTN (hypertension)    Hyperlipidemia    Long term current use of antithrombotics/antiplatelets    a.) clopidogrel   ST elevation myocardial infarction (STEMI) of true posterior wall (HCC) 08/09/2014   a.) LHC/PCI 08/09/2014: 3.0 x 15 mm Resolute Integrity DES x 1 pLCx with 10% residual post-intervention stenosis)   Unstable angina (HCC)     Procedure(s): ENDARTERECTOMY FEMORAL INSERTION OF ILIAC STENT APPLICATION OF CELL SAVER  Discharged Condition: good  HPI:  Aaron Knox is a 67 y.o. male who is now POD #2 from right femoral endarterectomy.  He is recovering as expected.  He will go home with Prevena Wound VAC on his right groin.  He has been instructed to remove this next Tuesday on 09/26/2022.  He has strong palpable pulses bilaterally to his lower extremities.  His right lower extremity  remains warm to the touch.  Patient endorses soreness but denies any pain today.  Patient will go home on 75 mg of Plavix daily.  He has been instructed to also continue his statin medication.  Patient request to go home, patient will be discharged later today.  Hospital Course:  Aaron Knox is a 67 y.o. male is S/P Right Extubated: POD # 0 Physical Exam:  Alert notes x3, no acute distress Face: Symmetrical.  Tongue is midline. Neck: Trachea is midline.  No swelling or bruising. Cardiovascular: Regular rate and rhythm Pulmonary: Clear to auscultation bilaterally Abdomen: Soft, nontender, nondistended Right groin access: Clean dry and intact.  No swelling or drainage noted Left lower extremity: Thigh soft.  Calf soft.  Extremities warm distally toes.  Hard to palpate pedal pulses however the foot is warm is her good capillary refill. Right lower extremity: Thigh soft.  Calf soft.  Extremities warm distally toes.  Hard to palpate pedal pulses however the foot is warm is her good capillary refill. Neurological: No deficits noted   Post-op wounds:  healing well  Pt. Ambulating, voiding and taking PO diet without difficulty. Pt pain controlled with PO pain meds.  Labs:  As below  Complications: none  Consults:    Significant Diagnostic Studies: CBC Lab Results  Component Value Date   WBC 7.8 09/21/2022   HGB 11.4 (L) 09/21/2022   HCT 34.4 (L) 09/21/2022   MCV 93.0 09/21/2022   PLT 159 09/21/2022    BMET    Component Value Date/Time  NA 138 09/21/2022 0015   K 4.0 09/21/2022 0015   CL 107 09/21/2022 0015   CO2 23 09/21/2022 0015   GLUCOSE 160 (H) 09/21/2022 0015   BUN 10 09/21/2022 0015   CREATININE 0.91 09/21/2022 0015   CALCIUM 8.5 (L) 09/21/2022 0015   GFRNONAA >60 09/21/2022 0015   GFRAA >60 01/30/2018 0408   COAG No results found for: "INR", "PROTIME"   Disposition:  Discharge to :Home  Allergies as of 09/22/2022       Reactions   Aspirin  Anaphylaxis   Penicillins Anaphylaxis   Has patient had a PCN reaction causing immediate rash, facial/tongue/throat swelling, SOB or lightheadedness with hypotension: Yes Has patient had a PCN reaction causing severe rash involving mucus membranes or skin necrosis: No Has patient had a PCN reaction that required hospitalization: Yes Has patient had a PCN reaction occurring within the last 10 years: No If all of the above answers are "NO", then may proceed with Cephalosporin use.        Medication List     TAKE these medications    atorvastatin 80 MG tablet Commonly known as: LIPITOR Take 1 tablet (80 mg total) by mouth daily at 6 PM. What changed: when to take this   clopidogrel 75 MG tablet Commonly known as: PLAVIX Take 1 tablet by mouth daily.   oxyCODONE-acetaminophen 5-325 MG tablet Commonly known as: PERCOCET/ROXICET Take 1-2 tablets by mouth every 4 (four) hours as needed for moderate pain.       Verbal and written Discharge instructions given to the patient. Wound care per Discharge AVS  Follow-up Information     Georgiana Spinner, NP Follow up in 3 week(s).   Specialty: Vascular Surgery Why: Post op right femoral endarterectomy. Bilat Duplex arterial U/S with ABI's Contact information: [address] [place] [address] [phone]                 Signed: Marcie Bal, NP  09/22/2022, 7:12 AM

## 2022-09-22 NOTE — TOC Transition Note (Signed)
Transition of Care [facility]) - CM/SW Discharge Note   Patient Details  Name: Aaron Knox MRN: [id number] Date of Birth: 1955/10/14  Transition of Care [facility]) CM/SW Contact:  Kreg Shropshire, RN Phone Number: 09/22/2022, 9:56 AM   Clinical Narrative:     Received d/c notification. Patient stated he would like a rolling walker for safety purposes at home. Sent order to Rolla Plate, NP to sign. Walker to be sent to patient room before d/c. Sent pt information to Cletis Athens from [employer]. Cletis Athens stated order is being sent for processing. He declined setting up at PCP appt. He stated he will just f/u with his surgery team. He also declined HH services. He has transportation to go home from a friend.       Patient Goals and CMS Choice      Discharge Placement                         Discharge Plan and Services Additional resources added to the After Visit Summary for                  DME Arranged: Walker rolling DME Agency: [employer] Date DME Agency Contacted: 09/22/22   Representative spoke with at DME Agency: Yvone Neu [employer]            Social Determinants of Health (SDOH) Interventions SDOH Screenings   Food Insecurity: No Food Insecurity (09/22/2022)  Housing: Low Risk  (09/22/2022)  Transportation Needs: No Transportation Needs (09/22/2022)  Utilities: Not At Risk (09/22/2022)  Tobacco Use: Medium Risk (09/21/2022)     Readmission Risk Interventions     No data to display

## 2022-09-22 NOTE — Discharge Instructions (Signed)
Remove wound Vac Next Tuesday 09/26/2022. You may shower after it is removed. Pat very dry and keep open to air but very dry. If you feel it necessary to place a bandage over the staples to keep your groin dry then okay to do so.   No heavy lifting until you return to clinic. Nothing more than a gallon of Milk  No driving until you return to clinic   Return to clinic if you are experiencing high fevers greater than 101.0 F chills or drainage from the wound.

## 2022-09-27 ENCOUNTER — Other Ambulatory Visit (INDEPENDENT_AMBULATORY_CARE_PROVIDER_SITE_OTHER): Payer: Self-pay | Admitting: Nurse Practitioner

## 2022-09-27 ENCOUNTER — Telehealth (INDEPENDENT_AMBULATORY_CARE_PROVIDER_SITE_OTHER): Payer: Self-pay

## 2022-09-27 MED ORDER — OXYCODONE-ACETAMINOPHEN 5-325 MG PO TABS: 1.0000 | ORAL_TABLET | ORAL | 0 refills | Status: DC | PRN

## 2022-09-27 NOTE — Telephone Encounter (Signed)
Patient left a message stating that his penis is twice the normal size, his scrotum between the size of apple and grapefruit, also his leg is swollen and tender to touch. Patient had right femoral endarterectomy on 09/20/2022. Please Advise

## 2022-09-27 NOTE — Telephone Encounter (Signed)
I called and discussed with patient.  This swelling is not abnormal his largest issue is due to the pain in his scrotal area.  He is advised to elevate his leg and utilize compression.  Patient will follow-up as scheduled and will let us know if the swelling worsens or becomes painful.  Based on his description of issues he does not have any evidence of a seroma

## 2022-10-09 ENCOUNTER — Other Ambulatory Visit (INDEPENDENT_AMBULATORY_CARE_PROVIDER_SITE_OTHER): Payer: Self-pay | Admitting: Vascular Surgery

## 2022-10-09 DIAGNOSIS — I739 Peripheral vascular disease, unspecified: Secondary | ICD-10-CM

## 2022-10-17 ENCOUNTER — Encounter (INDEPENDENT_AMBULATORY_CARE_PROVIDER_SITE_OTHER): Payer: Self-pay | Admitting: Nurse Practitioner

## 2022-10-17 ENCOUNTER — Ambulatory Visit (INDEPENDENT_AMBULATORY_CARE_PROVIDER_SITE_OTHER): Payer: Medicare PPO | Admitting: Nurse Practitioner

## 2022-10-17 ENCOUNTER — Ambulatory Visit (INDEPENDENT_AMBULATORY_CARE_PROVIDER_SITE_OTHER): Payer: Medicare PPO

## 2022-10-17 ENCOUNTER — Encounter (INDEPENDENT_AMBULATORY_CARE_PROVIDER_SITE_OTHER): Payer: Medicare PPO

## 2022-10-17 VITALS — BP 137/69 | HR 88 | Resp 18 | Ht 74.0 in | Wt 192.4 lb

## 2022-10-17 DIAGNOSIS — Z9889 Other specified postprocedural states: Secondary | ICD-10-CM

## 2022-10-17 DIAGNOSIS — I739 Peripheral vascular disease, unspecified: Secondary | ICD-10-CM

## 2022-10-17 DIAGNOSIS — I70221 Atherosclerosis of native arteries of extremities with rest pain, right leg: Secondary | ICD-10-CM

## 2022-10-18 LAB — VAS US ABI WITH/WO TBI
Left ABI: 0.93
Right ABI: 0.94

## 2022-10-18 NOTE — Progress Notes (Signed)
Subjective:    Patient ID: Aaron Knox, male    DOB: 07-29-1955, 67 y.o.   MRN: [id number] Chief Complaint  Patient presents with   Follow-up    3 weeks (10/13/2022); Post op right femoral endarterectomy. ABI's    The patient returns to the office for followup and review status post angiogram with intervention on 09/20/2022.   Procedure: PROCEDURE: 1. Right common femoral and superficial femoral endarterectomy with bovine pericardial patch angioplasty. 2. Open angioplasty and stent placement of the right common iliac artery. 3. Open angioplasty and stent placement of the right external iliac artery. 4. Placement of a disposable Prevena VAC right groin wound.   The patient notes improvement in the lower extremity symptoms. No interval shortening of the patient's claudication distance or rest pain symptoms. No new ulcers or wounds have occurred since the last visit.  The patient's incision is healing well with no evidence of dehiscence.  No evidence of infection.  There have been no significant changes to the patient's overall health care.  No documented history of amaurosis fugax or recent TIA symptoms. There are no recent neurological changes noted. No documented history of DVT, PE or superficial thrombophlebitis. The patient denies recent episodes of angina or shortness of breath.   ABI's Rt=0.94 and Lt=0.93  (previous ABI's Rt=0.53 and Lt=1.04) Duplex US of the right lower extremity shows biphasic waveforms with normal toe waveforms.  The left has triphasic/biphasic with normal toe waveforms.    Review of Systems  Cardiovascular:  Positive for leg swelling.  All other systems reviewed and are negative.      Objective:   Physical Exam Vitals reviewed.  HENT:     Head: Normocephalic.  Cardiovascular:     Rate and Rhythm: Normal rate.     Pulses: Normal pulses.  Pulmonary:     Effort: Pulmonary effort is normal.  Musculoskeletal:     Right lower leg: Edema  present.  Skin:    General: Skin is warm and dry.  Neurological:     Mental Status: He is alert and oriented to person, place, and time.  Psychiatric:        Mood and Affect: Mood normal.        Behavior: Behavior normal.        Thought Content: Thought content normal.        Judgment: Judgment normal.     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)    CAD in native artery    a.) LHC/PCI 08/09/2014 (setting of STEMI): 30% mLAD, 99% pLCx (3.0 x 15 mm Resolute Integrity DES x 1 with 10% residual post-intervention stenosis), 45% mLCx, 20% pRCA, 40% dRCA); b.) LHC 01/29/2018: 30% mLAD, 45% mLCx, 50% pRCA, 80% dRCA --> med mgmt   DDD (degenerative disc disease), lumbar    Diastolic dysfunction    a.) TTE 08/11/2014 (s/p STEMI): EF 45-50%, inf HK, AoV sclerosis, G1DD; b.) TTE 02/28/2018: EF 55-60%, G1DD   History of bilateral cataract extraction 2023   HTN (hypertension)    Hyperlipidemia    Long term current use of antithrombotics/antiplatelets    a.) clopidogrel   ST elevation myocardial infarction (STEMI) of true posterior wall (HCC) 08/09/2014   a.) LHC/PCI  08/09/2014: 3.0 x 15 mm Resolute Integrity DES x 1 pLCx with 10% residual post-intervention stenosis)   Unstable angina (HCC)     Social History   Socioeconomic History   Marital status: Single    Spouse name: Not on file   Number of children: 1   Years of education: Not on file   Highest education level: Not on file  Occupational History   Not on file  Tobacco Use   Smoking status: Former    Packs/day: .25    Types: Cigarettes    Quit date: 08/2022    Years since quitting: 0.1   Smokeless tobacco: Never  Vaping Use   Vaping Use: Never used  Substance and Sexual Activity   Alcohol use: Yes    Alcohol/week: 3.0 standard drinks  of alcohol    Types: 3 Glasses of wine per week    Comment: weekends   Drug use: No   Sexual activity: Not on file  Other Topics Concern   Not on file  Social History Narrative   Lives alone   Social Determinants of Health   Financial Resource Strain: Not on file  Food Insecurity: No Food Insecurity (09/22/2022)   Hunger Vital Sign    Worried About Running Out of Food in the Last Year: Never true    Ran Out of Food in the Last Year: Never true  Transportation Needs: No Transportation Needs (09/22/2022)   PRAPARE - Administrator, Civil Service (Medical): No    Lack of Transportation (Non-Medical): No  Physical Activity: Not on file  Stress: Not on file  Social Connections: Not on file  Intimate Partner Violence: Not At Risk (09/22/2022)   Humiliation, Afraid, Rape, and Kick questionnaire    Fear of Current or Ex-Partner: No    Emotionally Abused: No    Physically Abused: No    Sexually Abused: No    Past Surgical History:  Procedure Laterality Date   CARDIAC CATHETERIZATION N/A 08/09/2014   Procedure: Left Heart Cath and Coronary Angiography;  Surgeon: Iran Ouch, MD;  Location: [facility];  Service: Cardiovascular;  Laterality: N/A;   CARDIAC CATHETERIZATION N/A 08/09/2014   Procedure: Coronary Stent Intervention;  Surgeon: Iran Ouch, MD;  Location: [facility];  Service: Cardiovascular;  Laterality: N/A;   CATARACT EXTRACTION W/ INTRAOCULAR LENS  IMPLANT, BILATERAL Bilateral 2023   CORONARY ANGIOPLASTY  08/09/2014   ENDARTERECTOMY FEMORAL Right 09/20/2022   Procedure: ENDARTERECTOMY FEMORAL;  Surgeon: Annice Needy, MD;  Location: [facility];  Service: Vascular;  Laterality: Right;   HAND SURGERY Left 1980   bone chip with nerve damage   INSERTION OF ILIAC STENT Right 09/20/2022   Procedure: INSERTION OF ILIAC STENT;  Surgeon: Annice Needy, MD;  Location: [facility];  Service: Vascular;  Laterality: Right;   LEFT HEART CATH AND CORONARY  ANGIOGRAPHY N/A 01/29/2018   Procedure: LEFT HEART CATH AND CORONARY ANGIOGRAPHY;  Surgeon: Yvonne Kendall, MD;  Location: [facility];  Service: Cardiovascular;  Laterality: N/A;   LOWER EXTREMITY ANGIOGRAPHY Right 08/07/2022   Procedure: Lower Extremity Angiography;  Surgeon: Annice Needy, MD;  Location: [facility];  Service: Cardiovascular;  Laterality: Right;   nerve reconstruction Left 1980   hand    Family History  Problem Relation Age of Onset   CAD Maternal Grandfather     Allergies  Allergen Reactions   Aspirin Anaphylaxis   Penicillins Anaphylaxis    Has  patient had a PCN reaction causing immediate rash, facial/tongue/throat swelling, SOB or lightheadedness with hypotension: Yes Has patient had a PCN reaction causing severe rash involving mucus membranes or skin necrosis: No Has patient had a PCN reaction that required hospitalization: Yes Has patient had a PCN reaction occurring within the last 10 years: No If all of the above answers are "NO", then may proceed with Cephalosporin use.       Latest Ref Rng & Units 09/21/2022   12:15 AM 09/20/2022    7:00 PM 08/22/2022    9:39 AM  CBC  WBC 4.0 - 10.5 K/uL 7.8  8.8  4.8   Hemoglobin 13.0 - 17.0 g/dL 91.4  78.2  95.6   Hematocrit 39.0 - 52.0 % 34.4  37.2  41.5   Platelets 150 - 400 K/uL 159  168  189       CMP     Component Value Date/Time   NA 138 09/21/2022 0015   K 4.0 09/21/2022 0015   CL 107 09/21/2022 0015   CO2 23 09/21/2022 0015   GLUCOSE 160 (H) 09/21/2022 0015   BUN 10 09/21/2022 0015   CREATININE 0.91 09/21/2022 0015   CALCIUM 8.5 (L) 09/21/2022 0015   PROT 7.5 08/22/2022 0939   ALBUMIN 4.5 08/22/2022 0939   AST 27 08/22/2022 0939   ALT 29 08/22/2022 0939   ALKPHOS 79 08/22/2022 0939   BILITOT 0.8 08/22/2022 0939   GFRNONAA >60 09/21/2022 0015     No results found.     Assessment & Plan:   1. Atherosclerosis of native artery of right lower extremity with rest pain  (HCC) The incision is doing very well.  All staples removed today.  Steri-Strips applied today.  Patient advised to continue with walking but to refrain from any heavy lifting or strenuous activity just yet.  Will plan to have the patient return in 4 weeks to evaluate wound.  Noninvasive studies today show improvement from baseline.   Current Outpatient Medications on File Prior to Visit  Medication Sig Dispense Refill   atorvastatin (LIPITOR) 80 MG tablet Take 1 tablet (80 mg total) by mouth daily at 6 PM. (Patient taking differently: Take 80 mg by mouth daily.) 90 tablet 3   clopidogrel (PLAVIX) 75 MG tablet Take 1 tablet by mouth daily.     oxyCODONE-acetaminophen (PERCOCET/ROXICET) 5-325 MG tablet Take 1-2 tablets by mouth every 4 (four) hours as needed for moderate pain. 30 tablet 0   No current facility-administered medications on file prior to visit.    There are no Patient Instructions on file for this visit. No follow-ups on file.   Georgiana Spinner, NP

## 2022-10-31 ENCOUNTER — Other Ambulatory Visit: Payer: Self-pay

## 2022-10-31 ENCOUNTER — Emergency Department: Payer: Medicare PPO

## 2022-10-31 DIAGNOSIS — Z87891 Personal history of nicotine dependence: Secondary | ICD-10-CM

## 2022-10-31 DIAGNOSIS — L7634 Postprocedural seroma of skin and subcutaneous tissue following other procedure: Secondary | ICD-10-CM | POA: Diagnosis present

## 2022-10-31 DIAGNOSIS — J432 Centrilobular emphysema: Secondary | ICD-10-CM | POA: Diagnosis present

## 2022-10-31 DIAGNOSIS — I2510 Atherosclerotic heart disease of native coronary artery without angina pectoris: Secondary | ICD-10-CM | POA: Diagnosis present

## 2022-10-31 DIAGNOSIS — Z886 Allergy status to analgesic agent status: Secondary | ICD-10-CM

## 2022-10-31 DIAGNOSIS — Y838 Other surgical procedures as the cause of abnormal reaction of the patient, or of later complication, without mention of misadventure at the time of the procedure: Secondary | ICD-10-CM | POA: Diagnosis present

## 2022-10-31 DIAGNOSIS — Z880 Allergy status to penicillin: Secondary | ICD-10-CM

## 2022-10-31 DIAGNOSIS — Z955 Presence of coronary angioplasty implant and graft: Secondary | ICD-10-CM

## 2022-10-31 DIAGNOSIS — Z7902 Long term (current) use of antithrombotics/antiplatelets: Secondary | ICD-10-CM

## 2022-10-31 DIAGNOSIS — Z95820 Peripheral vascular angioplasty status with implants and grafts: Secondary | ICD-10-CM

## 2022-10-31 DIAGNOSIS — L03314 Cellulitis of groin: Secondary | ICD-10-CM | POA: Diagnosis not present

## 2022-10-31 DIAGNOSIS — E785 Hyperlipidemia, unspecified: Secondary | ICD-10-CM | POA: Diagnosis present

## 2022-10-31 DIAGNOSIS — I70221 Atherosclerosis of native arteries of extremities with rest pain, right leg: Secondary | ICD-10-CM | POA: Diagnosis present

## 2022-10-31 DIAGNOSIS — T8141XA Infection following a procedure, superficial incisional surgical site, initial encounter: Principal | ICD-10-CM | POA: Diagnosis present

## 2022-10-31 DIAGNOSIS — Z8249 Family history of ischemic heart disease and other diseases of the circulatory system: Secondary | ICD-10-CM

## 2022-10-31 DIAGNOSIS — M5136 Other intervertebral disc degeneration, lumbar region: Secondary | ICD-10-CM | POA: Diagnosis present

## 2022-10-31 DIAGNOSIS — I119 Hypertensive heart disease without heart failure: Secondary | ICD-10-CM | POA: Diagnosis present

## 2022-10-31 DIAGNOSIS — Z79899 Other long term (current) drug therapy: Secondary | ICD-10-CM

## 2022-10-31 DIAGNOSIS — I252 Old myocardial infarction: Secondary | ICD-10-CM

## 2022-10-31 LAB — CBC WITH DIFFERENTIAL/PLATELET
Abs Immature Granulocytes: 0.01 10*3/uL (ref 0.00–0.07)
Basophils Absolute: 0 10*3/uL (ref 0.0–0.1)
Basophils Relative: 1 %
Eosinophils Absolute: 0.1 10*3/uL (ref 0.0–0.5)
Eosinophils Relative: 2 %
HCT: 37.1 % — ABNORMAL LOW (ref 39.0–52.0)
Hemoglobin: 12.4 g/dL — ABNORMAL LOW (ref 13.0–17.0)
Immature Granulocytes: 0 %
Lymphocytes Relative: 20 %
Lymphs Abs: 0.9 10*3/uL (ref 0.7–4.0)
MCH: 31 pg (ref 26.0–34.0)
MCHC: 33.4 g/dL (ref 30.0–36.0)
MCV: 92.8 fL (ref 80.0–100.0)
Monocytes Absolute: 0.4 10*3/uL (ref 0.1–1.0)
Monocytes Relative: 9 %
Neutro Abs: 3.2 10*3/uL (ref 1.7–7.7)
Neutrophils Relative %: 68 %
Platelets: 193 10*3/uL (ref 150–400)
RBC: 4 MIL/uL — ABNORMAL LOW (ref 4.22–5.81)
RDW: 12.1 % (ref 11.5–15.5)
WBC: 4.7 10*3/uL (ref 4.0–10.5)
nRBC: 0 % (ref 0.0–0.2)

## 2022-10-31 LAB — COMPREHENSIVE METABOLIC PANEL WITH GFR
ALT: 23 U/L (ref 0–44)
AST: 23 U/L (ref 15–41)
Albumin: 4.4 g/dL (ref 3.5–5.0)
Alkaline Phosphatase: 108 U/L (ref 38–126)
Anion gap: 7 (ref 5–15)
BUN: 14 mg/dL (ref 8–23)
CO2: 25 mmol/L (ref 22–32)
Calcium: 9 mg/dL (ref 8.9–10.3)
Chloride: 104 mmol/L (ref 98–111)
Creatinine, Ser: 1 mg/dL (ref 0.61–1.24)
GFR, Estimated: >60 mL/min
Glucose, Bld: 107 mg/dL — ABNORMAL HIGH (ref 70–99)
Potassium: 3.3 mmol/L — ABNORMAL LOW (ref 3.5–5.1)
Sodium: 136 mmol/L (ref 135–145)
Total Bilirubin: 0.4 mg/dL (ref 0.3–1.2)
Total Protein: 7.2 g/dL (ref 6.5–8.1)

## 2022-10-31 LAB — URINALYSIS, ROUTINE W REFLEX MICROSCOPIC
Bilirubin Urine: NEGATIVE
Glucose, UA: NEGATIVE mg/dL
Hgb urine dipstick: NEGATIVE
Ketones, ur: NEGATIVE mg/dL
Leukocytes,Ua: NEGATIVE
Nitrite: NEGATIVE
Protein, ur: NEGATIVE mg/dL
Specific Gravity, Urine: 1.015 (ref 1.005–1.030)
pH: 6 (ref 5.0–8.0)

## 2022-10-31 MED ORDER — IOHEXOL 300 MG/ML  SOLN
100.0000 mL | Freq: Once | INTRAMUSCULAR | Status: AC | PRN
  Administered 2022-10-31: 100 mL via INTRAVENOUS

## 2022-10-31 NOTE — ED Triage Notes (Signed)
Pt presents to ER with c/o right side groin swelling that started this morning.  Pt also states he has had increased difficulty urinating that started on 7/21.  Pt states he has been going around 10-12 hours without urinating.  Pt states he has been having difficulty determining whether his bladder feels full or not.  Pt states he did have right side endarterectomy 6/12.  States he right side of his groin appears swollen, and pain has moved up his leg slightly.  Pt otherwise A&O x4 and in NAD at this time.

## 2022-11-01 ENCOUNTER — Inpatient Hospital Stay: Payer: Medicare PPO

## 2022-11-01 ENCOUNTER — Inpatient Hospital Stay
Admission: EM | Admit: 2022-11-01 | Discharge: 2022-11-02 | DRG: 863 | Disposition: A | Discharge status: Home / Self Care | Payer: Medicare PPO | Attending: Family Medicine | Admitting: Family Medicine

## 2022-11-01 DIAGNOSIS — Z87891 Personal history of nicotine dependence: Secondary | ICD-10-CM | POA: Diagnosis not present

## 2022-11-01 DIAGNOSIS — Z8249 Family history of ischemic heart disease and other diseases of the circulatory system: Secondary | ICD-10-CM | POA: Diagnosis not present

## 2022-11-01 DIAGNOSIS — I252 Old myocardial infarction: Secondary | ICD-10-CM | POA: Diagnosis not present

## 2022-11-01 DIAGNOSIS — J432 Centrilobular emphysema: Secondary | ICD-10-CM | POA: Diagnosis present

## 2022-11-01 DIAGNOSIS — T8149XA Infection following a procedure, other surgical site, initial encounter: Secondary | ICD-10-CM | POA: Insufficient documentation

## 2022-11-01 DIAGNOSIS — Z955 Presence of coronary angioplasty implant and graft: Secondary | ICD-10-CM | POA: Diagnosis not present

## 2022-11-01 DIAGNOSIS — I119 Hypertensive heart disease without heart failure: Secondary | ICD-10-CM | POA: Diagnosis present

## 2022-11-01 DIAGNOSIS — I70221 Atherosclerosis of native arteries of extremities with rest pain, right leg: Secondary | ICD-10-CM | POA: Diagnosis present

## 2022-11-01 DIAGNOSIS — Z79899 Other long term (current) drug therapy: Secondary | ICD-10-CM

## 2022-11-01 DIAGNOSIS — Z792 Long term (current) use of antibiotics: Secondary | ICD-10-CM

## 2022-11-01 DIAGNOSIS — Z7902 Long term (current) use of antithrombotics/antiplatelets: Secondary | ICD-10-CM | POA: Diagnosis not present

## 2022-11-01 DIAGNOSIS — Z886 Allergy status to analgesic agent status: Secondary | ICD-10-CM | POA: Diagnosis not present

## 2022-11-01 DIAGNOSIS — Y838 Other surgical procedures as the cause of abnormal reaction of the patient, or of later complication, without mention of misadventure at the time of the procedure: Secondary | ICD-10-CM | POA: Diagnosis present

## 2022-11-01 DIAGNOSIS — I10 Essential (primary) hypertension: Secondary | ICD-10-CM | POA: Insufficient documentation

## 2022-11-01 DIAGNOSIS — E785 Hyperlipidemia, unspecified: Secondary | ICD-10-CM | POA: Diagnosis present

## 2022-11-01 DIAGNOSIS — I2510 Atherosclerotic heart disease of native coronary artery without angina pectoris: Secondary | ICD-10-CM | POA: Diagnosis present

## 2022-11-01 DIAGNOSIS — L03314 Cellulitis of groin: Secondary | ICD-10-CM | POA: Diagnosis present

## 2022-11-01 DIAGNOSIS — M5136 Other intervertebral disc degeneration, lumbar region: Secondary | ICD-10-CM | POA: Diagnosis present

## 2022-11-01 DIAGNOSIS — Z9889 Other specified postprocedural states: Secondary | ICD-10-CM

## 2022-11-01 DIAGNOSIS — T8141XA Infection following a procedure, superficial incisional surgical site, initial encounter: Secondary | ICD-10-CM | POA: Diagnosis present

## 2022-11-01 DIAGNOSIS — L7634 Postprocedural seroma of skin and subcutaneous tissue following other procedure: Secondary | ICD-10-CM | POA: Diagnosis present

## 2022-11-01 DIAGNOSIS — Z880 Allergy status to penicillin: Secondary | ICD-10-CM | POA: Diagnosis not present

## 2022-11-01 DIAGNOSIS — Z95820 Peripheral vascular angioplasty status with implants and grafts: Secondary | ICD-10-CM | POA: Diagnosis not present

## 2022-11-01 LAB — HIV ANTIBODY (ROUTINE TESTING W REFLEX): HIV Screen 4th Generation wRfx: NONREACTIVE

## 2022-11-01 LAB — CULTURE, BLOOD (ROUTINE X 2)

## 2022-11-01 LAB — LACTIC ACID, PLASMA: Lactic Acid, Venous: 1.2 mmol/L (ref 0.5–1.9)

## 2022-11-01 MED ORDER — ACETAMINOPHEN 650 MG RE SUPP: 650.0000 mg | Freq: Four times a day (QID) | RECTAL | Status: DC | PRN

## 2022-11-01 MED ORDER — ONDANSETRON HCL 4 MG PO TABS: 4.0000 mg | ORAL_TABLET | Freq: Four times a day (QID) | ORAL | Status: DC | PRN

## 2022-11-01 MED ORDER — VANCOMYCIN HCL 2000 MG/400ML IV SOLN
2000.0000 mg | Freq: Once | INTRAVENOUS | Status: AC
  Administered 2022-11-01: 2000 mg via INTRAVENOUS
  Filled 2022-11-01: qty 400

## 2022-11-01 MED ORDER — MORPHINE SULFATE (PF) 2 MG/ML IV SOLN: 2.0000 mg | INTRAVENOUS | Status: DC | PRN

## 2022-11-01 MED ORDER — VANCOMYCIN HCL 2000 MG/400ML IV SOLN: 2000.0000 mg | Freq: Two times a day (BID) | INTRAVENOUS | Status: DC

## 2022-11-01 MED ORDER — ACETAMINOPHEN 325 MG PO TABS: 650.0000 mg | ORAL_TABLET | Freq: Four times a day (QID) | ORAL | Status: DC | PRN

## 2022-11-01 MED ORDER — ONDANSETRON HCL 4 MG/2ML IJ SOLN: 4.0000 mg | Freq: Four times a day (QID) | INTRAMUSCULAR | Status: DC | PRN

## 2022-11-01 MED ORDER — ATORVASTATIN CALCIUM 20 MG PO TABS
80.0000 mg | ORAL_TABLET | Freq: Every day | ORAL | Status: DC
  Administered 2022-11-01 – 2022-11-02 (×2): 80 mg via ORAL
  Filled 2022-11-01 (×2): qty 4

## 2022-11-01 MED ORDER — VANCOMYCIN HCL IN DEXTROSE 1-5 GM/200ML-% IV SOLN
1000.0000 mg | Freq: Two times a day (BID) | INTRAVENOUS | Status: DC
  Administered 2022-11-01 – 2022-11-02 (×2): 1000 mg via INTRAVENOUS
  Filled 2022-11-01 (×2): qty 200

## 2022-11-01 MED ORDER — HYDROCODONE-ACETAMINOPHEN 5-325 MG PO TABS: 1.0000 | ORAL_TABLET | ORAL | Status: DC | PRN

## 2022-11-01 MED ORDER — SODIUM CHLORIDE 0.9 % IV BOLUS
500.0000 mL | Freq: Once | INTRAVENOUS | Status: AC
  Administered 2022-11-01: 500 mL via INTRAVENOUS

## 2022-11-01 NOTE — Assessment & Plan Note (Addendum)
Surgical site infection Vancomycin Cool compresses to the area for comfort Vascular consulted Dr. Dew-Per ED provider Dr. Wyn Quaker may consider I&D with wound VAC Will keep n.p.o.

## 2022-11-01 NOTE — H&P (Signed)
History and Physical    Patient: Aaron Knox TKZ:[id number] DOB: Nov 30, 1955 DOA: 11/01/2022 DOS: the patient was seen and examined on 11/01/2022 PCP: Pcp, No  Patient coming from: Home  Chief Complaint:  Chief Complaint  Patient presents with   Groin Swelling    HPI: Aaron Knox is a 67 y.o. male with medical history significant for CAD, PAD, emphysema, HTN S/p right femoral endarterectomy in June 2024 who presents the ED with pain redness and swelling at the area of intervention.  Patient states since his procedure he has had a swelling in the right groin with numbness going down the medial aspect of the right thigh with firmness and swelling however it has been gradually improving.  On 7/23, the area became increasingly numb, red and tender so he decided to come in.  He denies fever and chills ED course and data review: BP 168/99 with otherwise normal vitals labs with normal WBC of 4.7 and lactic acid 1.2 otherwise unremarkable except for potassium of 3.3.  Urinalysis unremarkable CT abdomen and pelvis fluid color Xun in the right groin at area of placement of stent as follows: IMPRESSION: 1. Status post stent placement extending from the right common iliac artery through the right external iliac artery. Skin thickening and subcutaneous edema/soft tissue stranding within the right groin and upper thigh. Several mildly rim enhancing fluid collections within the right groin superficial to the common femoral and superficial femoral vessels. These could represent postoperative fluid collections, sterility is indeterminate by this CT. No internal gas within the fluid collections. 2. Kidneys show no hydronephrosis. There is a small right posterior bladder diverticulum. 3. Aortic atherosclerosis.  The ED provider spoke with vascular surgeon Dr. Debroah Loop who requested hospitalist admission and started on vancomycin. Hospitalist consulted.   Review of Systems: As mentioned in the  history of present illness. All other systems reviewed and are negative.  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)    CAD in native artery    a.) LHC/PCI 08/09/2014 (setting of STEMI): 30% mLAD, 99% pLCx (3.0 x 15 mm Resolute Integrity DES x 1 with 10% residual post-intervention stenosis), 45% mLCx, 20% pRCA, 40% dRCA); b.) LHC 01/29/2018: 30% mLAD, 45% mLCx, 50% pRCA, 80% dRCA --> med mgmt   DDD (degenerative disc disease), lumbar    Diastolic dysfunction    a.) TTE 08/11/2014 (s/p STEMI): EF 45-50%, inf HK, AoV sclerosis, G1DD; b.) TTE 02/28/2018: EF 55-60%, G1DD   History of bilateral cataract extraction 2023   HTN (hypertension)    Hyperlipidemia    Long term current use of antithrombotics/antiplatelets    a.) clopidogrel   ST elevation myocardial infarction (STEMI) of true posterior wall (HCC) 08/09/2014   a.) LHC/PCI 08/09/2014: 3.0 x 15 mm Resolute Integrity DES x 1 pLCx with 10% residual post-intervention stenosis)   Unstable angina [facility])    Past Surgical History:  Procedure Laterality Date   CARDIAC CATHETERIZATION N/A 08/09/2014   Procedure: Left Heart Cath and Coronary Angiography;  Surgeon: Iran Ouch, MD;  Location: [facility] INVASIVE CV [facility];  Service: Cardiovascular;  Laterality: N/A;   CARDIAC CATHETERIZATION N/A 08/09/2014   Procedure: Coronary Stent Intervention;  Surgeon: Iran Ouch, MD;  Location: [facility] INVAS[facility] CV [facility];  Service: Cardiovascular;  Laterality: N/A;   CATARACT EXTRACTION W/ INTRAOCULAR LENS  IMPLANT, BILATERAL Bilateral 2023   CORONARY ANGIOPLASTY  08/09/2014   ENDARTERECTOMY FEMORAL Right 09/20/2022   Procedure: ENDARTERECTOMY FEMORAL;  Surgeon: Annice Needy, MD;  Location: [facility] ORS;  Service: Vascular;  Laterality: Right;   HAND SURGERY Left 1980   bone chip with nerve damage   INSERTION OF ILIAC STENT Right 09/20/2022   Procedure: INSERTION OF ILIAC STENT;  Surgeon: Annice Needy, MD;   Location: [facility] ORS;  Service: Vascular;  Laterality: Right;   LEFT HEART CATH AND CORONARY ANGIOGRAPHY N/A 01/29/2018   Procedure: LEFT HEART CATH AND CORONARY ANGIOGRAPHY;  Surgeon: Yvonne Kendall, MD;  Location: [facility] INVASIVE CV LAB;  Service: Cardiovascular;  Laterality: N/A;   LOWER EXTREMITY ANGIOGRAPHY Right 08/07/2022   Procedure: Lower Extremity Angiography;  Surgeon: Annice Needy, MD;  Location: [facility] INVASIVE CV LAB;  Service: Cardiovascular;  Laterality: Right;   nerve reconstruction Left 1980   hand   Social History:  reports that he quit smoking about 2 months ago. His smoking use included cigarettes. He has never used smokeless tobacco. He reports current alcohol use of about 3.0 standard drinks of alcohol per week. He reports that he does not use drugs.  Allergies  Allergen Reactions   Aspirin Anaphylaxis   Penicillins Anaphylaxis    Has patient had a PCN reaction causing immediate rash, facial/tongue/throat swelling, SOB or lightheadedness with hypotension: Yes Has patient had a PCN reaction causing severe rash involving mucus membranes or skin necrosis: No Has patient had a PCN reaction that required hospitalization: Yes Has patient had a PCN reaction occurring within the last 10 years: No If all of the above answers are "NO", then may proceed with Cephalosporin use.    Family History  Problem Relation Age of Onset   CAD Maternal Grandfather     Prior to Admission medications   Medication Sig Start Date End Date Taking? Authorizing Provider  atorvastatin (LIPITOR) 80 MG tablet Take 1 tablet (80 mg total) by mouth daily at 6 PM. Patient taking differently: Take 80 mg by mouth daily. 08/22/22   Furth, Cadence H, PA-C  clopidogrel (PLAVIX) 75 MG tablet Take 1 tablet by mouth daily. 08/23/22   [provider]  oxyCODONE-acetaminophen (PERCOCET/ROXICET) 5-325 MG tablet Take 1-2 tablets by mouth every 4 (four) hours as needed for moderate pain. 09/30/22   Georgiana Spinner, NP    Physical Exam: Vitals:   10/31/22 2226 10/31/22 2231  BP: (!) 168/99   Pulse: 88   Resp: 18   Temp: 97.9 F (36.6 C)   TempSrc: Oral   SpO2: 96%   Weight:  88 kg   Physical Exam Vitals and nursing note reviewed.  Constitutional:      General: He is not in acute distress. HENT:     Head: Normocephalic and atraumatic.  Cardiovascular:     Rate and Rhythm: Normal rate and regular rhythm.     Heart sounds: Normal heart sounds.  Pulmonary:     Effort: Pulmonary effort is normal.     Breath sounds: Normal breath sounds.  Abdominal:     Palpations: Abdomen is soft.     Tenderness: There is no abdominal tenderness.  Genitourinary:    Comments: See pic Musculoskeletal:     Comments: See pic. Upper inner medial thigh  with defined swelling, red and warm and tender to touch  Neurological:     Mental Status: Mental status is at baseline.     Labs on Admission: I have personally reviewed following labs and imaging studies  CBC: Recent Labs  Lab 10/31/22 2233  WBC 4.7  NEUTROABS 3.2  HGB  12.4*  HCT 37.1*  MCV 92.8  PLT 193   Basic Metabolic Panel: Recent Labs  Lab 10/31/22 2233  NA 136  K 3.3*  CL 104  CO2 25  GLUCOSE 107*  BUN 14  CREATININE 1.00  CALCIUM 9.0   GFR: Estimated Creatinine Clearance: 81.1 mL/min (by C-G formula based on SCr of 1 mg/dL). Liver Function Tests: Recent Labs  Lab 10/31/22 2233  AST 23  ALT 23  ALKPHOS 108  BILITOT 0.4  PROT 7.2  ALBUMIN 4.4   No results for input(s): "LIPASE", "AMYLASE" in the last 168 hours. No results for input(s): "AMMONIA" in the last 168 hours. Coagulation Profile: No results for input(s): "INR", "PROTIME" in the last 168 hours. Cardiac Enzymes: No results for input(s): "CKTOTAL", "CKMB", "CKMBINDEX", "TROPONINI" in the last 168 hours. BNP (last 3 results) No results for input(s): "PROBNP" in the last 8760 hours. HbA1C: No results for input(s): "HGBA1C" in the last 72  hours. CBG: No results for input(s): "GLUCAP" in the last 168 hours. Lipid Profile: No results for input(s): "CHOL", "HDL", "LDLCALC", "TRIG", "CHOLHDL", "LDLDIRECT" in the last 72 hours. Thyroid Function Tests: No results for input(s): "TSH", "T4TOTAL", "FREET4", "T3FREE", "THYROIDAB" in the last 72 hours. Anemia Panel: No results for input(s): "VITAMINB12", "FOLATE", "FERRITIN", "TIBC", "IRON", "RETICCTPCT" in the last 72 hours. Urine analysis:    Component Value Date/Time   COLORURINE STRAW (A) 10/31/2022 2230   APPEARANCEUR CLEAR (A) 10/31/2022 2230   LABSPEC 1.015 10/31/2022 2230   PHURINE 6.0 10/31/2022 2230   GLUCOSEU NEGATIVE 10/31/2022 2230   HGBUR NEGATIVE 10/31/2022 2230   BILIRUBINUR NEGATIVE 10/31/2022 2230   KETONESUR NEGATIVE 10/31/2022 2230   PROTEINUR NEGATIVE 10/31/2022 2230   NITRITE NEGATIVE 10/31/2022 2230   LEUKOCYTESUR NEGATIVE 10/31/2022 2230    Radiological Exams on Admission: CT ABDOMEN PELVIS W CONTRAST  Result Date: 10/31/2022 CLINICAL DATA:  Right-sided groin swelling EXAM: CT ABDOMEN AND PELVIS WITH CONTRAST TECHNIQUE: Multidetector CT imaging of the abdomen and pelvis was performed using the standard protocol following bolus administration of intravenous contrast. RADIATION DOSE REDUCTION: This exam was performed according to the departmental dose-optimization program which includes automated exposure control, adjustment of the mA and/or kV according to patient size and/or use of iterative reconstruction technique. CONTRAST:  OMNIPAQUE IOHEXOL 300 MG/ML  SOLN COMPARISON:  09/20/2022 FINDINGS: Lower chest: Lung bases demonstrate no acute airspace disease. Small hiatal hernia Hepatobiliary: No focal liver abnormality is seen. No gallstones, gallbladder wall thickening, or biliary dilatation. Pancreas: Unremarkable. No pancreatic ductal dilatation or surrounding inflammatory changes. Spleen: Normal in size without focal abnormality. Adrenals/Urinary  Tract: Adrenal glands are unremarkable. Kidneys show no hydronephrosis. Cysts and subcentimeter hypodensities in the kidneys too small to further characterize, no imaging follow-up is recommended. Right posterior bladder diverticulum. Stomach/Bowel: Stomach is within normal limits. Appendix appears normal. No evidence of bowel wall thickening, distention, or inflammatory changes. Vascular/Lymphatic: Advanced aortic atherosclerosis. Ectatic left common iliac artery of to 16 cm extends into the right external iliac artery. Reproductive: Prostate is unremarkable. Other: Negative for pelvic effusion or free air. Skin thickening and subcutaneous soft tissue stranding in the right groin. Slightly rim enhancing fluid collections within the right groin, anterior to the common femoral vessels. Cranial collection measures 3.5 by 3.3 by 6 cm. The more caudal collection anterior to the superficial femoral artery measures 3.6 by 2.6 by 7.1 cm. Musculoskeletal: No acute osseous abnormality IMPRESSION: 1. Status post stent placement extending from the right common iliac artery through the  right external iliac artery. Skin thickening and subcutaneous edema/soft tissue stranding within the right groin and upper thigh. Several mildly rim enhancing fluid collections within the right groin superficial to the common femoral and superficial femoral vessels. These could represent postoperative fluid collections, sterility is indeterminate by this CT. No internal gas within the fluid collections. 2. Kidneys show no hydronephrosis. There is a small right posterior bladder diverticulum. 3. Aortic atherosclerosis. Aortic Atherosclerosis (ICD10-I70.0). Electronically Signed   By: Jasmine Pang M.D.   On: 10/31/2022 23:35     Data Reviewed: Relevant notes from primary care and specialist visits, past discharge summaries as available in EHR, including Care Everywhere. Prior diagnostic testing as pertinent to current admission  diagnoses Updated medications and problem lists for reconciliation ED course, including vitals, labs, imaging, treatment and response to treatment Triage notes, nursing and pharmacy notes and ED provider's notes Notable results as noted in HPI   Assessment and Plan: HTN (hypertension) Pending med rec  Cellulitis of right groin Surgical site infection Vancomycin Cool compresses to the area for comfort Vascular consulted Dr. Dew-Per ED provider Dr. Wyn Quaker may consider I&D with wound VAC Will keep n.p.o.  Centrilobular emphysema (HCC) DuoNebs as needed.  Currently not exacerbated  Coronary artery disease involving native coronary artery without angina pectoris Continue atorvastatin Hold Plavix for now until after procedure    DVT prophylaxis: SCD  Consults: Dr Wyn Quaker, vascular  Advance Care Planning:   Code Status: Prior   Family Communication: none  Disposition Plan: Back to previous home environment  Severity of Illness: The appropriate patient status for this patient is INPATIENT. Inpatient status is judged to be reasonable and necessary in order to provide the required intensity of service to ensure the patient's safety. The patient's presenting symptoms, physical exam findings, and initial radiographic and laboratory data in the context of their chronic comorbidities is felt to place them at high risk for further clinical deterioration. Furthermore, it is not anticipated that the patient will be medically stable for discharge from the hospital within 2 midnights of admission.   * I certify that at the point of admission it is my clinical judgment that the patient will require inpatient hospital care spanning beyond 2 midnights from the point of admission due to high intensity of service, high risk for further deterioration and high frequency of surveillance required.*  Author: Andris Baumann, MD 11/01/2022 5:15 AM  For on call review [email or web address].

## 2022-11-01 NOTE — Progress Notes (Signed)
PROGRESS NOTE    Aaron Knox  BMW:[id number] DOB: [birth year] DOA: 11/01/2022 PCP: Pcp, No  Chief Complaint  Patient presents with   Groin Swelling    Brief Narrative:   Aaron Knox is Aaron Knox 67 y.o. male with medical history significant for CAD, PAD, emphysema, HTN S/p right femoral endarterectomy in June 2024 who presents the ED with pain redness and swelling at the area of intervention.  Patient states since his procedure he has had Aaron Koury swelling in the right groin with numbness going down the medial aspect of the right thigh with firmness and swelling however it has been gradually improving.  On 7/23, the area became increasingly numb, red and tender so he decided to come in.   Assessment & Plan:   Principal Problem:   Cellulitis of right groin Active Problems:   Coronary artery disease involving native coronary artery without angina pectoris   Centrilobular emphysema (HCC)   Surgical site infection   HTN (hypertension)  Cellulitis of right groin Surgical site infection CT with skin thickening and subcutaneous edema/soft tissue stranding within right groin and upper thigh - several mildly rim enhancing fluid collections within the right groin superficial to the common femoral and superficial collections Vascular consulted Aaron Knox ED provider Aaron Knox may consider I&D with wound VAC Currently on vanc  Follow venous US Will keep n.p.o.   Peripheral Artery Disease S/p femoral endarterectomy, insertion of iliac stent  Plavix currently on hold, awaiting vascular intervention   HTN (hypertension) Not on home meds   Centrilobular emphysema (HCC) DuoNebs as needed.  Currently not exacerbated   Coronary artery disease involving native coronary artery without angina pectoris Continue atorvastatin Hold Plavix for now until after procedure     DVT prophylaxis: SCD Code Status: full Family Communication: none Disposition:   Status is: Inpatient Remains inpatient  appropriate because: pending vascular   Consultants:  Vascular surgery  Procedures:  none  Antimicrobials:  Anti-infectives (From admission, onward)    Start     Dose/Rate Route Frequency Ordered Stop   11/01/22 2000  vancomycin (VANCOCIN) IVPB 1000 mg/200 mL premix        1,000 mg 200 mL/hr over 60 Minutes Intravenous Every 12 hours 11/01/22 0918 11/08/22 0759   11/01/22 1800  vancomycin (VANCOREADY) IVPB 2000 mg/400 mL  Status:  Discontinued        2,000 mg 200 mL/hr over 120 Minutes Intravenous Every 12 hours 11/01/22 0535 11/01/22 0918   11/01/22 0445  vancomycin (VANCOREADY) IVPB 2000 mg/400 mL        2,000 mg 200 mL/hr over 120 Minutes Intravenous  Once 11/01/22 0433 11/01/22 0859       Subjective: No new complaints  Objective: Vitals:   10/31/22 2231 11/01/22 0617 11/01/22 0856 11/01/22 0945  BP:   115/69 (!) 133/57  Pulse:   65 (!) 53  Resp:   20 16  Temp:  98 F (36.7 C) (!) 97.5 F (36.4 C) 97.9 F (36.6 C)  TempSrc:  Oral    SpO2:   99% 98%  Weight: 88 kg       Intake/Output Summary (Last 24 hours) at 11/01/2022 1039 Last data filed at 11/01/2022 0859 Gross per 24 hour  Intake 400 ml  Output --  Net 400 ml   Filed Weights   10/31/22 2231  Weight: 88 kg    Examination:  General exam: Appears calm and comfortable  Respiratory system: unlabored Cardiovascular system: RRR Gastrointestinal system: Abdomen is nondistended,  soft and nontender. Central nervous system: Alert and oriented. No focal neurological deficits. Extremities: right lower extremity edema, inguinal post op scar well healing     Data Reviewed: I have personally reviewed following labs and imaging studies  CBC: Recent Labs  Lab 10/31/22 2233  WBC 4.7  NEUTROABS 3.2  HGB 12.4*  HCT 37.1*  MCV 92.8  PLT 193    Basic Metabolic Panel: Recent Labs  Lab 10/31/22 2233  NA 136  K 3.3*  CL 104  CO2 25  GLUCOSE 107*  BUN 14  CREATININE 1.00  CALCIUM 9.0     GFR: Estimated Creatinine Clearance: 81.1 mL/min (by C-G formula based on SCr of 1 mg/dL).  Liver Function Tests: Recent Labs  Lab 10/31/22 2233  AST 23  ALT 23  ALKPHOS 108  BILITOT 0.4  PROT 7.2  ALBUMIN 4.4    CBG: No results for input(s): "GLUCAP" in the last 168 hours.   Recent Results (from the past 240 hour(s))  Blood culture (routine x 2)     Status: None (Preliminary result)   Collection Time: 11/01/22  4:40 AM   Specimen: BLOOD  Result Value Ref Range Status   Specimen Description BLOOD LEFT ARM  Final   Special Requests   Final    BOTTLES DRAWN AEROBIC AND ANAEROBIC Blood Culture adequate volume   Culture   Final    NO GROWTH <12 HOURS Performed at [facility], [address] [place], [place] [address]    Report Status PENDING  Incomplete  Blood culture (routine x 2)     Status: None (Preliminary result)   Collection Time: 11/01/22  4:40 AM   Specimen: BLOOD  Result Value Ref Range Status   Specimen Description BLOOD LEFT AC  Final   Special Requests   Final    BOTTLES DRAWN AEROBIC AND ANAEROBIC Blood Culture results may not be optimal due to an inadequate volume of blood received in culture bottles   Culture   Final    NO GROWTH <12 HOURS Performed at [facility], [address], [place], [place] [address]    Report Status PENDING  Incomplete         Radiology Studies: CT ABDOMEN PELVIS W CONTRAST  Result Date: 10/31/2022 CLINICAL DATA:  Right-sided groin swelling EXAM: CT ABDOMEN AND PELVIS WITH CONTRAST TECHNIQUE: Multidetector CT imaging of the abdomen and pelvis was performed using the standard protocol following bolus administration of intravenous contrast. RADIATION DOSE REDUCTION: This exam was performed according to the departmental dose-optimization program which includes automated exposure control, adjustment of the mA and/or kV according to patient size and/or use of iterative reconstruction technique. CONTRAST:   OMNIPAQUE IOHEXOL 300 MG/ML  SOLN COMPARISON:  09/20/2022 FINDINGS: Lower chest: Lung bases demonstrate no acute airspace disease. Small hiatal hernia Hepatobiliary: No focal liver abnormality is seen. No gallstones, gallbladder wall thickening, or biliary dilatation. Pancreas: Unremarkable. No pancreatic ductal dilatation or surrounding inflammatory changes. Spleen: Normal in size without focal abnormality. Adrenals/Urinary Tract: Adrenal glands are unremarkable. Kidneys show no hydronephrosis. Cysts and subcentimeter hypodensities in the kidneys too small to further characterize, no imaging follow-up is recommended. Right posterior bladder diverticulum. Stomach/Bowel: Stomach is within normal limits. Appendix appears normal. No evidence of bowel wall thickening, distention, or inflammatory changes. Vascular/Lymphatic: Advanced aortic atherosclerosis. Ectatic left common iliac artery of to 16 cm extends into the right external iliac artery. Reproductive: Prostate is unremarkable. Other: Negative for pelvic effusion or free air. Skin thickening  and subcutaneous soft tissue stranding in the right groin. Slightly rim enhancing fluid collections within the right groin, anterior to the common femoral vessels. Cranial collection measures 3.5 by 3.3 by 6 cm. The more caudal collection anterior to the superficial femoral artery measures 3.6 by 2.6 by 7.1 cm. Musculoskeletal: No acute osseous abnormality IMPRESSION: 1. Status post stent placement extending from the right common iliac artery through the right external iliac artery. Skin thickening and subcutaneous edema/soft tissue stranding within the right groin and upper thigh. Several mildly rim enhancing fluid collections within the right groin superficial to the common femoral and superficial femoral vessels. These could represent postoperative fluid collections, sterility is indeterminate by this CT. No internal gas within the fluid collections. 2. Kidneys show  no hydronephrosis. There is Izabell Schalk small right posterior bladder diverticulum. 3. Aortic atherosclerosis. Aortic Atherosclerosis (ICD10-I70.0). Electronically Signed   By: Aaron Knox M.D.   On: 10/31/2022 23:35        Scheduled Meds:  atorvastatin  80 mg Oral Daily   Continuous Infusions:  vancomycin       LOS: 0 days    Time spent: over 30  min    Lacretia Nicks, MD [facility]   To contact the attending provider between 7A-7P or the covering provider during after hours 7P-7A, please log into the web site [email or web address] and access using universal [place] password for that web site. If you do not have the password, please call the hospital operator.  11/01/2022, 10:39 AM

## 2022-11-01 NOTE — ED Provider Notes (Signed)
[facility] Provider Note    Event Date/Time   First MD Initiated Contact with Patient 11/01/22 0403     (approximate)   History   Groin Swelling   HPI  Aaron Knox is a 67 y.o. male on review of discharge summary from June 12 has a history of atherosclerosis of an artery with rest pain  Noted to have a history of right lower extremity rest pain, coronary artery disease, diastolic dysfunction hypertension prior STEMI  In June and a right femoral endarterectomy   Yesterday patient started noticed a little bit of discomfort in his right groin around where the surgery scar is and then today and throughout the day yesterday became more swollen and sore a little bit red and the skin started to feel firm over his surgery site in the groin.  He also reports that for quite a while now he is only urinated twice a day, but does not have any difficulty urinating just feels like he is not going to the bathroom as frequently.  No pain or burning with urination.  Denies any nausea vomiting or abdominal pain.  He is not experiencing fever  He is not having any pain except when he goes to walk and sore in the groin area.  No problems with the testicles or penis  Physical Exam   Triage Vital Signs: ED Triage Vitals  Encounter Vitals Group     BP 10/31/22 2226 (!) 168/99     Systolic BP Percentile --      Diastolic BP Percentile --      Pulse Rate 10/31/22 2226 88     Resp 10/31/22 2226 18     Temp 10/31/22 2226 97.9 F (36.6 C)     Temp Source 10/31/22 2226 Oral     SpO2 10/31/22 2226 96 %     Weight 10/31/22 2231 194 lb (88 kg)     Height --      Head Circumference --      Peak Flow --      Pain Score 10/31/22 2230 5     Pain Loc --      Pain Education --      Exclude from Growth Chart --     Most recent vital signs: Vitals:   10/31/22 2226 11/01/22 0617  BP: (!) 168/99   Pulse: 88   Resp: 18   Temp: 97.9 F (36.6 C) 98 F (36.7 C)  SpO2:  96%      General: Awake, no distress.  Very pleasant.  Financial planner. CV:  Good peripheral perfusion.  Normal tones and rate.  Strong palpable pulses including dorsalis pedis in the right foot is warm and well-perfused with mild edema from about the shin down which patient reports is chronic Resp:  Normal effort.  Clear bilateral Abd:    Itself is soft, nontender.  Seems just slightly distended more in the lower abdomen and towards the right groin region patient reports no pain or discomfort there Other:  In the right pubis and right groin region is somewhat indurated to palpation, there is perhaps mild erythema around what appears to be a well-healed surgical scar.  There is no drainage from the region but tenderness and slight underlying induration detected in this region the patient reports this is the area he is experiencing pain and discomfort Normal uncircumcised penis.  Normal scrotal bilateral.  Perineum is normal  ED Results / Procedures / Treatments   Labs (  all labs ordered are listed, but only abnormal results are displayed) Labs Reviewed  CBC WITH DIFFERENTIAL/PLATELET - Abnormal; Notable for the following components:      Result Value   RBC 4.00 (*)    Hemoglobin 12.4 (*)    HCT 37.1 (*)    All other components within normal limits  COMPREHENSIVE METABOLIC PANEL - Abnormal; Notable for the following components:   Potassium 3.3 (*)    Glucose, Bld 107 (*)    All other components within normal limits  URINALYSIS, ROUTINE W REFLEX MICROSCOPIC - Abnormal; Notable for the following components:   Color, Urine STRAW (*)    APPearance CLEAR (*)    All other components within normal limits  CULTURE, BLOOD (ROUTINE X 2)  CULTURE, BLOOD (ROUTINE X 2)  LACTIC ACID, PLASMA  HIV ANTIBODY (ROUTINE TESTING W REFLEX)     EKG     RADIOLOGY  Personally interpreted the patient's CT scan, it is concerning for potential fluid collection or other abnormality in the area of the  right surgical site/groin  CT ABDOMEN PELVIS W CONTRAST  Result Date: 10/31/2022 CLINICAL DATA:  Right-sided groin swelling EXAM: CT ABDOMEN AND PELVIS WITH CONTRAST TECHNIQUE: Multidetector CT imaging of the abdomen and pelvis was performed using the standard protocol following bolus administration of intravenous contrast. RADIATION DOSE REDUCTION: This exam was performed according to the departmental dose-optimization program which includes automated exposure control, adjustment of the mA and/or kV according to patient size and/or use of iterative reconstruction technique. CONTRAST:  OMNIPAQUE IOHEXOL 300 MG/ML  SOLN COMPARISON:  09/20/2022 FINDINGS: Lower chest: Lung bases demonstrate no acute airspace disease. Small hiatal hernia Hepatobiliary: No focal liver abnormality is seen. No gallstones, gallbladder wall thickening, or biliary dilatation. Pancreas: Unremarkable. No pancreatic ductal dilatation or surrounding inflammatory changes. Spleen: Normal in size without focal abnormality. Adrenals/Urinary Tract: Adrenal glands are unremarkable. Kidneys show no hydronephrosis. Cysts and subcentimeter hypodensities in the kidneys too small to further characterize, no imaging follow-up is recommended. Right posterior bladder diverticulum. Stomach/Bowel: Stomach is within normal limits. Appendix appears normal. No evidence of bowel wall thickening, distention, or inflammatory changes. Vascular/Lymphatic: Advanced aortic atherosclerosis. Ectatic left common iliac artery of to 16 cm extends into the right external iliac artery. Reproductive: Prostate is unremarkable. Other: Negative for pelvic effusion or free air. Skin thickening and subcutaneous soft tissue stranding in the right groin. Slightly rim enhancing fluid collections within the right groin, anterior to the common femoral vessels. Cranial collection measures 3.5 by 3.3 by 6 cm. The more caudal collection anterior to the superficial femoral artery  measures 3.6 by 2.6 by 7.1 cm. Musculoskeletal: No acute osseous abnormality IMPRESSION: 1. Status post stent placement extending from the right common iliac artery through the right external iliac artery. Skin thickening and subcutaneous edema/soft tissue stranding within the right groin and upper thigh. Several mildly rim enhancing fluid collections within the right groin superficial to the common femoral and superficial femoral vessels. These could represent postoperative fluid collections, sterility is indeterminate by this CT. No internal gas within the fluid collections. 2. Kidneys show no hydronephrosis. There is a small right posterior bladder diverticulum. 3. Aortic atherosclerosis. Aortic Atherosclerosis (ICD10-I70.0). Electronically Signed   By: Jasmine Pang M.D.   On: 10/31/2022 23:35     CT imaging discussed with Dr. Wyn Quaker   PROCEDURES:  Critical Care performed: No  Procedures   MEDICATIONS ORDERED IN ED: Medications  atorvastatin (LIPITOR) tablet 80 mg (has no administration in time range)  acetaminophen (TYLENOL) tablet 650 mg (has no administration in time range)    Or  acetaminophen (TYLENOL) suppository 650 mg (has no administration in time range)  ondansetron (ZOFRAN) tablet 4 mg (has no administration in time range)    Or  ondansetron (ZOFRAN) injection 4 mg (has no administration in time range)  HYDROcodone-acetaminophen (NORCO/VICODIN) 5-325 MG per tablet 1-2 tablet (has no administration in time range)  morphine (PF) 2 MG/ML injection 2 mg (has no administration in time range)  vancomycin (VANCOREADY) IVPB 2000 mg/400 mL (has no administration in time range)  iohexol (OMNIPAQUE) 300 MG/ML solution 100 mL (100 mLs Intravenous Contrast Given 10/31/22 2304)  vancomycin (VANCOREADY) IVPB 2000 mg/400 mL (2,000 mg Intravenous New Bag/Given 11/01/22 0522)  sodium chloride 0.9 % bolus 500 mL (500 mLs Intravenous New Bag/Given 11/01/22 0521)     IMPRESSION / MDM / ASSESSMENT  AND PLAN / ED COURSE  I reviewed the triage vital signs and the nursing notes.                              Differential diagnosis includes, but is not limited to, possible surgical site infection, abscess, hernia, cellulitis, pseudoaneurysm, hematoma, vascular anomaly, bladder infection, intra-abdominal causation etc. considered.  Given the location proximity to surgical site I am concerned this may represent finding related to his recent surgery.  I have called and I spoke through and discussed the case with Dr. Wyn Quaker  I have requested a vascular consultation to see the patient today in the ER as well to provide further input given the location and findings on exam as well as proximity to recent surgery  Patient's presentation is most consistent with acute complicated illness / injury requiring diagnostic workup.   Discussed with Dr. Wyn Quaker, he recommends upon review of the patient's clinical history and imaging that the patient be admitted to hospitalist service and initiate vancomycin as sole antibiotic at this time.  I also consulted with and discussed the case with our hospitalist patient accepted to hospitalist service by Dr. Para March, vascular surgery to provide consultation this morning  Patient very understanding agreeable with plan       FINAL CLINICAL IMPRESSION(S) / ED DIAGNOSES   Final diagnoses:  Surgical site infection     Rx / DC Orders   ED Discharge Orders     None        Note:  This document was prepared using Dragon voice recognition software and may include unintentional dictation errors.   Sharyn Creamer, MD 11/01/22 [phone]

## 2022-11-01 NOTE — Assessment & Plan Note (Signed)
DuoNebs as needed.  Currently not exacerbated

## 2022-11-01 NOTE — Assessment & Plan Note (Signed)
Pending med rec

## 2022-11-01 NOTE — Consult Note (Signed)
Hospital Consult    Reason for Consult:  Right Post Operative Groin Infection Requesting Physician:  Dr Lindajo Royal MD  MRN #:  [id number]  History of Present Illness: This is a 67 y.o. male  with medical history significant for CAD, PAD, emphysema, HTN S/p right femoral endarterectomy in June 2024 who presents the ED with pain redness and swelling at the area of intervention. On work up patient was found to have multiple small pockets of seroma superior to the femoral vessels that did not appear infected.   On exam today patient is resting comfortably. Endorses some pain throughout his entire right lower extremity. Leg appears to be cellulitic. Leg is warm to the touch. Pulses are palpable. No other complaints and vitals all remain stable.   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)    CAD in native artery    a.) LHC/PCI 08/09/2014 (setting of STEMI): 30% mLAD, 99% pLCx (3.0 x 15 mm Resolute Integrity DES x 1 with 10% residual post-intervention stenosis), 45% mLCx, 20% pRCA, 40% dRCA); b.) LHC 01/29/2018: 30% mLAD, 45% mLCx, 50% pRCA, 80% dRCA --> med mgmt   DDD (degenerative disc disease), lumbar    Diastolic dysfunction    a.) TTE 08/11/2014 (s/p STEMI): EF 45-50%, inf HK, AoV sclerosis, G1DD; b.) TTE 02/28/2018: EF 55-60%, G1DD   History of bilateral cataract extraction 2023   HTN (hypertension)    Hyperlipidemia    Long term current use of antithrombotics/antiplatelets    a.) clopidogrel   ST elevation myocardial infarction (STEMI) of true posterior wall (HCC) 08/09/2014   a.) LHC/PCI 08/09/2014: 3.0 x 15 mm Resolute Integrity DES x 1 pLCx with 10% residual post-intervention stenosis)   Unstable angina [facility])     Past Surgical History:  Procedure Laterality Date   CARDIAC CATHETERIZATION N/A 08/09/2014   Procedure: Left Heart Cath and Coronary Angiography;  Surgeon: Iran Ouch, MD;  Location: [facility] INVASIVE CV LAB;   Service: Cardiovascular;  Laterality: N/A;   CARDIAC CATHETERIZATION N/A 08/09/2014   Procedure: Coronary Stent Intervention;  Surgeon: Iran Ouch, MD;  Location: [facility] INVASIVE CV LAB;  Service: Cardiovascular;  Laterality: N/A;   CATARACT EXTRACTION W/ INTRAOCULAR LENS  IMPLANT, BILATERAL Bilateral 2023   CORONARY ANGIOPLASTY  08/09/2014   ENDARTERECTOMY FEMORAL Right 09/20/2022   Procedure: ENDARTERECTOMY FEMORAL;  Surgeon: Annice Needy, MD;  Location: [facility] ORS;  Service: Vascular;  Laterality: Right;   HAND SURGERY Left 1980   bone chip with nerve damage   INSERTION OF ILIAC STENT Right 09/20/2022   Procedure: INSERTION OF ILIAC STENT;  Surgeon: Annice Needy, MD;  Location: [facility] ORS;  Service: Vascular;  Laterality: Right;   LEFT HEART CATH AND CORONARY ANGIOGRAPHY N/A 01/29/2018   Procedure: LEFT HEART CATH AND CORONARY ANGIOGRAPHY;  Surgeon: Yvonne Kendall, MD;  Location: [facility] INVASIVE CV LAB;  Service: Cardiovascular;  Laterality: N/A;   LOWER EXTREMITY ANGIOGRAPHY Right 08/07/2022   Procedure: Lower Extremity Angiography;  Surgeon: Annice Needy, MD;  Location: [facility] INVASIVE CV LAB;  Service: Cardiovascular;  Laterality: Right;   nerve reconstruction Left 1980   hand    Allergies  Allergen Reactions   Aspirin Anaphylaxis   Penicillins Anaphylaxis    Has patient had a PCN reaction causing immediate rash, facial/tongue/throat swelling, SOB or lightheadedness with hypotension: Yes Has patient had a PCN reaction causing severe rash involving mucus membranes or skin necrosis: No Has patient had a  PCN reaction that required hospitalization: Yes Has patient had a PCN reaction occurring within the last 10 years: No If all of the above answers are "NO", then may proceed with Cephalosporin use.    Prior to Admission medications   Medication Sig Start Date End Date Taking? Authorizing Provider  atorvastatin (LIPITOR) 80 MG tablet Take 1 tablet (80 mg total) by mouth daily at 6  PM. Patient taking differently: Take 80 mg by mouth daily. 08/22/22   Furth, Cadence H, PA-C  clopidogrel (PLAVIX) 75 MG tablet Take 1 tablet by mouth daily. 08/23/22   [provider]  oxyCODONE-acetaminophen (PERCOCET/ROXICET) 5-325 MG tablet Take 1-2 tablets by mouth every 4 (four) hours as needed for moderate pain. 09/30/22   Georgiana Spinner, NP    Social History   Socioeconomic History   Marital status: Single    Spouse name: Not on file   Number of children: 1   Years of education: Not on file   Highest education level: Not on file  Occupational History   Not on file  Tobacco Use   Smoking status: Former    Current packs/day: 0.00    Types: Cigarettes    Quit date: 08/2022    Years since quitting: 0.2   Smokeless tobacco: Never  Vaping Use   Vaping status: Never Used  Substance and Sexual Activity   Alcohol use: Yes    Alcohol/week: 3.0 standard drinks of alcohol    Types: 3 Glasses of wine per week    Comment: weekends   Drug use: No   Sexual activity: Not on file  Other Topics Concern   Not on file  Social History Narrative   Lives alone   Social Determinants of Health   Financial Resource Strain: Not on file  Food Insecurity: No Food Insecurity (09/22/2022)   Hunger Vital Sign    Worried About Running Out of Food in the Last Year: Never true    Ran Out of Food in the Last Year: Never true  Transportation Needs: No Transportation Needs (09/22/2022)   PRAPARE - Administrator, Civil Service (Medical): No    Lack of Transportation (Non-Medical): No  Physical Activity: Not on file  Stress: Not on file  Social Connections: Not on file  Intimate Partner Violence: Not At Risk (09/22/2022)   Humiliation, Afraid, Rape, and Kick questionnaire    Fear of Current or Ex-Partner: No    Emotionally Abused: No    Physically Abused: No    Sexually Abused: No     Family History  Problem Relation Age of Onset   CAD Maternal Grandfather     ROS:  Otherwise negative unless mentioned in HPI  Physical Examination  Vitals:   10/31/22 2226 11/01/22 0617  BP: (!) 168/99   Pulse: 88   Resp: 18   Temp: 97.9 F (36.6 C) 98 F (36.7 C)  SpO2: 96%    Body mass index is 24.91 kg/m.  General:  WDWN in NAD Gait: Not observed HENT: WNL, normocephalic Pulmonary: normal non-labored breathing, without Rales, rhonchi,  wheezing Cardiac: regular, without  Murmurs, rubs or gallops; without carotid bruits Abdomen: Positive bowel sounds, soft, NT/ND, no masses Skin: without rashes Vascular Exam/Pulses: Positive bilateral lower extremity pulses. Warm to touch  Extremities: without ischemic changes, without Gangrene , without cellulitis; without open wounds;  Musculoskeletal: no muscle wasting or atrophy  Neurologic: A&O X 3;  No focal weakness or paresthesias are detected; speech is fluent/normal Psychiatric:  The pt has Normal affect. Lymph:  Unremarkable  CBC    Component Value Date/Time   WBC 4.7 10/31/2022 2233   RBC 4.00 (L) 10/31/2022 2233   HGB 12.4 (L) 10/31/2022 2233   HCT 37.1 (L) 10/31/2022 2233   PLT 193 10/31/2022 2233   MCV 92.8 10/31/2022 2233   MCH 31.0 10/31/2022 2233   MCHC 33.4 10/31/2022 2233   RDW 12.1 10/31/2022 2233   LYMPHSABS 0.9 10/31/2022 2233   MONOABS 0.4 10/31/2022 2233   EOSABS 0.1 10/31/2022 2233   BASOSABS 0.0 10/31/2022 2233    BMET    Component Value Date/Time   NA 136 10/31/2022 2233   K 3.3 (L) 10/31/2022 2233   CL 104 10/31/2022 2233   CO2 25 10/31/2022 2233   GLUCOSE 107 (H) 10/31/2022 2233   BUN 14 10/31/2022 2233   CREATININE 1.00 10/31/2022 2233   CALCIUM 9.0 10/31/2022 2233   GFRNONAA >60 10/31/2022 2233   GFRAA >60 01/30/2018 0408    COAGS: No results found for: "INR", "PROTIME"   Non-Invasive Vascular Imaging:   EXAM:10/31/22 CT ABDOMEN AND PELVIS WITH CONTRAST   TECHNIQUE: Multidetector CT imaging of the abdomen and pelvis was performed using the standard protocol  following bolus administration of intravenous contrast.   RADIATION DOSE REDUCTION: This exam was performed according to the departmental dose-optimization program which includes automated exposure control, adjustment of the mA and/or kV according to patient size and/or use of iterative reconstruction technique.   CONTRAST:  OMNIPAQUE IOHEXOL 300 MG/ML  SOLN   COMPARISON:  09/20/2022   FINDINGS: Lower chest: Lung bases demonstrate no acute airspace disease. Small hiatal hernia   Hepatobiliary: No focal liver abnormality is seen. No gallstones, gallbladder wall thickening, or biliary dilatation.   Pancreas: Unremarkable. No pancreatic ductal dilatation or surrounding inflammatory changes.   Spleen: Normal in size without focal abnormality.   Adrenals/Urinary Tract: Adrenal glands are unremarkable. Kidneys show no hydronephrosis. Cysts and subcentimeter hypodensities in the kidneys too small to further characterize, no imaging follow-up is recommended. Right posterior bladder diverticulum.   Stomach/Bowel: Stomach is within normal limits. Appendix appears normal. No evidence of bowel wall thickening, distention, or inflammatory changes.   Vascular/Lymphatic: Advanced aortic atherosclerosis. Ectatic left common iliac artery of to 16 cm extends into the right external iliac artery.   Reproductive: Prostate is unremarkable.   Other: Negative for pelvic effusion or free air. Skin thickening and subcutaneous soft tissue stranding in the right groin. Slightly rim enhancing fluid collections within the right groin, anterior to the common femoral vessels. Cranial collection measures 3.5 by 3.3 by 6 cm. The more caudal collection anterior to the superficial femoral artery measures 3.6 by 2.6 by 7.1 cm.   Musculoskeletal: No acute osseous abnormality   IMPRESSION: 1. Status post stent placement extending from the right common iliac artery through the right external iliac  artery. Skin thickening and subcutaneous edema/soft tissue stranding within the right groin and upper thigh. Several mildly rim enhancing fluid collections within the right groin superficial to the common femoral and superficial femoral vessels. These could represent postoperative fluid collections, sterility is indeterminate by this CT. No internal gas within the fluid collections. 2. Kidneys show no hydronephrosis. There is a small right posterior bladder diverticulum. 3. Aortic atherosclerosis.  Statin:  Yes.   Beta Blocker:  No. Aspirin:  No. ACEI:  No. ARB:  No. CCB use:  No Other antiplatelets/anticoagulants:  Yes.   Plavix 75 mg daily   ASSESSMENT/PLAN: This  is a 67 y.o. male with past medical history significant for CAD, PAD, emphysema, HTN  now S/p right femoral endarterectomy in June 2024 who presents the ED with pain redness and swelling at the area of intervention. Patient was last seen in clinic on 10/17/2022 and was seen by Sheppard Plumber. Patient at that time was recovering as expected.   PLAN: Patient noted to have what appears to be cellulitis of the right lower extremity. On workup he was noted to have multiple fluid collections within the right groin superior to the femoral vessels.  Therefore the vessels appear to be intact and non infected.  Vascular surgery recommends IV Antibiotics at this time. The patient is not draining from the area and draining these pockets of fluid would put him at high risk for femoral vessel infection. No indication for any procedure or intervention at this time. Patient to follow up in Vascular Clinic as scheduled.    -I discussed in detail the plan with Dr Festus Barren and he is in agreement with the plan.    Marcie Bal [facility] 11/01/2022 7:26 AM

## 2022-11-01 NOTE — Assessment & Plan Note (Addendum)
Continue atorvastatin Hold Plavix for now until after procedure

## 2022-11-01 NOTE — Progress Notes (Signed)
Pharmacy Antibiotic Note  Aaron Knox is a 67 y.o. male admitted on 11/01/2022 with cellulitis.  Pharmacy has been consulted for Vancomycin dosing for 7 days.  Plan: Pt given Vancomycin 2000 mg once. Vancomycin 1000 mg IV Q 12 hrs. Goal AUC 400-550. Expected AUC: 440.4 SCr used: 1.0  Pharmacy will continue to follow and will adjust abx dosing whenever warranted.  Temp (24hrs), Avg:97.9 F (36.6 C), Min:97.9 F (36.6 C), Max:97.9 F (36.6 C)   Recent Labs  Lab 10/31/22 2233 11/01/22 0440  WBC 4.7  --   CREATININE 1.00  --   LATICACIDVEN  --  1.2    Estimated Creatinine Clearance: 81.1 mL/min (by C-G formula based on SCr of 1 mg/dL).    Allergies  Allergen Reactions   Aspirin Anaphylaxis   Penicillins Anaphylaxis    Has patient had a PCN reaction causing immediate rash, facial/tongue/throat swelling, SOB or lightheadedness with hypotension: Yes Has patient had a PCN reaction causing severe rash involving mucus membranes or skin necrosis: No Has patient had a PCN reaction that required hospitalization: Yes Has patient had a PCN reaction occurring within the last 10 years: No If all of the above answers are "NO", then may proceed with Cephalosporin use.    Antimicrobials this admission: 7/24 Vancomycin >> x 7 days  Microbiology results: 7/24 BCx: Pending  Thank you for allowing pharmacy to be a part of this patient's care.  Otelia Sergeant, PharmD, [facility] 11/01/2022 5:35 AM

## 2022-11-01 NOTE — Progress Notes (Signed)
PHARMACY -  BRIEF ANTIBIOTIC NOTE   Pharmacy has received consult(s) for Vancomycin from an ED provider.  The patient's profile has been reviewed for ht/wt/allergies/indication/available labs.    One time order(s) placed for Vancomycin 2 gm per pt wt: 88 kg.  Further antibiotics/pharmacy consults should be ordered by admitting physician if indicated.                       Thank you, Otelia Sergeant, PharmD, [facility] 11/01/2022 4:33 AM

## 2022-11-02 DIAGNOSIS — L03314 Cellulitis of groin: Secondary | ICD-10-CM | POA: Diagnosis not present

## 2022-11-02 LAB — CBC WITH DIFFERENTIAL/PLATELET
Abs Immature Granulocytes: 0.01 10*3/uL (ref 0.00–0.07)
Basophils Absolute: 0 10*3/uL (ref 0.0–0.1)
Basophils Relative: 1 %
Eosinophils Absolute: 0.1 10*3/uL (ref 0.0–0.5)
Eosinophils Relative: 2 %
Hemoglobin: 11.5 g/dL — ABNORMAL LOW (ref 13.0–17.0)
Lymphocytes Relative: 15 %
MCH: 30.9 pg (ref 26.0–34.0)
MCHC: 34.6 g/dL (ref 30.0–36.0)
MCV: 89.2 fL (ref 80.0–100.0)
Monocytes Relative: 8 %
Neutro Abs: 4.1 10*3/uL (ref 1.7–7.7)
Neutrophils Relative %: 74 %
Platelets: 175 10*3/uL (ref 150–400)
RBC: 3.72 MIL/uL — ABNORMAL LOW (ref 4.22–5.81)
RDW: 12.2 % (ref 11.5–15.5)
WBC: 5.5 10*3/uL (ref 4.0–10.5)

## 2022-11-02 LAB — COMPREHENSIVE METABOLIC PANEL
ALT: 22 U/L (ref 0–44)
AST: 22 U/L (ref 15–41)
Albumin: 3.5 g/dL (ref 3.5–5.0)
Alkaline Phosphatase: 83 U/L (ref 38–126)
Anion gap: 6 (ref 5–15)
BUN: 12 mg/dL (ref 8–23)
Calcium: 8.6 mg/dL — ABNORMAL LOW (ref 8.9–10.3)
Chloride: 108 mmol/L (ref 98–111)
Creatinine, Ser: 0.89 mg/dL (ref 0.61–1.24)
GFR, Estimated: >60 mL/min (ref >60)
Glucose, Bld: 98 mg/dL (ref 70–99)
Potassium: 3.7 mmol/L (ref 3.5–5.1)
Sodium: 138 mmol/L (ref 135–145)
Total Bilirubin: 0.7 mg/dL (ref 0.3–1.2)
Total Protein: 6 g/dL — ABNORMAL LOW (ref 6.5–8.1)

## 2022-11-02 LAB — CULTURE, BLOOD (ROUTINE X 2)
Culture: NO GROWTH
Culture: NO GROWTH
Special Requests: ADEQUATE

## 2022-11-02 LAB — PHOSPHORUS: Phosphorus: 3.7 mg/dL (ref 2.5–4.6)

## 2022-11-02 LAB — MAGNESIUM: Magnesium: 2.1 mg/dL (ref 1.7–2.4)

## 2022-11-02 MED ORDER — CEFADROXIL 500 MG PO CAPS: 500.0000 mg | ORAL_CAPSULE | Freq: Two times a day (BID) | ORAL | 0 refills | Status: AC

## 2022-11-02 MED ORDER — CEFADROXIL 500 MG PO CAPS
500.0000 mg | ORAL_CAPSULE | Freq: Two times a day (BID) | ORAL | Status: DC
  Administered 2022-11-02: 500 mg via ORAL
  Filled 2022-11-02: qty 1

## 2022-11-02 MED ORDER — VANCOMYCIN HCL 1250 MG/250ML IV SOLN
1250.0000 mg | Freq: Two times a day (BID) | INTRAVENOUS | Status: DC
  Filled 2022-11-02: qty 250

## 2022-11-02 MED ORDER — CLOPIDOGREL BISULFATE 75 MG PO TABS
75.0000 mg | ORAL_TABLET | Freq: Every day | ORAL | Status: DC
  Administered 2022-11-02: 75 mg via ORAL
  Filled 2022-11-02: qty 1

## 2022-11-02 NOTE — Progress Notes (Signed)
PT Screen Note  Patient Details Name: Aaron Knox MRN: [id number] DOB: 1956-03-01   Cancelled Treatment:    Reason Eval/Treat Not Completed: PT screened, no needs identified, will sign off Pt laying in bed in NAD.  Pleasant and eager to move, reports R LE feeling much better and swelling/redness have all but disappeared and he is in now pain.  Pt was able to hop out of bed, SLS to don jeans and easily, safely and confidently do multiple loops around the nurses station at community appropriate speed w/o AD. No further PT needs.    Malachi Pro, DPT 11/02/2022, 12:14 PM

## 2022-11-02 NOTE — Progress Notes (Signed)
  Progress Note    11/02/2022 11:09 AM * No surgery found *  Subjective:  This is a 67 y.o. male  with medical history significant for CAD, PAD, emphysema, HTN S/p right femoral endarterectomy in June 2024 who presents the ED with pain redness and swelling at the area of intervention. On work up patient was found to have multiple small pockets of seroma superior to the femoral vessels that did not appear infected.    On exam today patient is resting comfortably. Endorses some pain throughout his entire right lower extremity. Leg appears to be cellulitic. Leg is warm to the touch. Pulses are palpable. No other complaints and vitals all remain stable.    Vitals:   11/01/22 2309 11/02/22 0802  BP: 117/64 121/72  Pulse: (!) 58 66  Resp: 18 16  Temp: 98.6 F (37 C) 97.7 F (36.5 C)  SpO2: 97% 96%   Physical Exam: Cardiac:  RRR, normal S1, S2.  No murmurs Lungs: Clear throughout on auscultation, no rales, rhonchi, or wheezing Incisions: None Extremities: Right lower extremity with erythema.  Positive pulses throughout both lower extremities.  No drainage or complications noted from postsurgical right groin incision. Abdomen: Positive bowel sounds throughout, soft, nontender and nondistended. Neurologic: AAOx3, follows all commands and answers all questions appropriately  CBC    Component Value Date/Time   WBC 5.5 11/02/2022 0448   RBC 3.72 (L) 11/02/2022 0448   HGB 11.5 (L) 11/02/2022 0448   HCT 33.2 (L) 11/02/2022 0448   PLT 175 11/02/2022 0448   MCV 89.2 11/02/2022 0448   MCH 30.9 11/02/2022 0448   MCHC 34.6 11/02/2022 0448   RDW 12.2 11/02/2022 0448   LYMPHSABS 0.8 11/02/2022 0448   MONOABS 0.4 11/02/2022 0448   EOSABS 0.1 11/02/2022 0448   BASOSABS 0.0 11/02/2022 0448    BMET    Component Value Date/Time   NA 138 11/02/2022 0448   K 3.7 11/02/2022 0448   CL 108 11/02/2022 0448   CO2 24 11/02/2022 0448   GLUCOSE 98 11/02/2022 0448   BUN 12 11/02/2022 0448    CREATININE 0.89 11/02/2022 0448   CALCIUM 8.6 (L) 11/02/2022 0448   GFRNONAA >60 11/02/2022 0448   GFRAA >60 01/30/2018 0408    INR No results found for: "INR"   Intake/Output Summary (Last 24 hours) at 11/02/2022 1109 Last data filed at 11/02/2022 1050 Gross per 24 hour  Intake 560 ml  Output 601 ml  Net -41 ml     Assessment/Plan:  67 y.o. male who was admitted for IV antibiotics for right lower extremity cellulitis. * No surgery found *   PLAN: Patient this morning has no white blood cell count, no fevers, very minimal erythema and redness to his right lower extremity.  Patient's been on vancomycin for 3 days.  Vascular surgery agrees with the hospitalist team to discharge patient home on oral antibiotics for another 7 days.  DVT prophylaxis:  Lovenox 40 mg SQ Q 24hrs   Breta Demedeiros R Ineta [facility]ning [facility] 11/02/2022 11:09 AM

## 2022-11-02 NOTE — Progress Notes (Signed)
Pharmacy Antibiotic Note  Aaron Knox is a 67 y.o. male admitted on 11/01/2022 with cellulitis. PMH significant for CAD, HTN, HLD, TUD. Patient had right femoral endarterectomy in June 2024 and now has pain redness and swelling at the area of intervention. Vascular found patient to have multiple small pockets of seroma superior to the femoral vessels that did not appear infected. No plans for intervention per vascular. Pharmacy has been consulted for vancomycin dosing.  Plan: Day 2 of antibiotics Increase vancomycin to 1250 mg IV Q24H per improved renal function. Goal AUC 400-550. Expected AUC: 498.5 Expected Css min: 14.8 SCr used: 0.89  Weight used: IBW, Vd used: 0.72 (BMI 24.9) Check vanc levels after 4th dose  Continue to monitor renal function and follow culture results   Temp (24hrs), Avg:98 F (36.7 C), Min:97.7 F (36.5 C), Max:98.6 F (37 C)   Recent Labs  Lab 10/31/22 2233 11/01/22 0440 11/02/22 0448  WBC 4.7  --  5.5  CREATININE 1.00  --  0.89  LATICACIDVEN  --  1.2  --     Estimated Creatinine Clearance: 91.1 mL/min (by C-G formula based on SCr of 0.89 mg/dL).    Allergies  Allergen Reactions   Aspirin Anaphylaxis   Penicillins Anaphylaxis    Has patient had a PCN reaction causing immediate rash, facial/tongue/throat swelling, SOB or lightheadedness with hypotension: Yes Has patient had a PCN reaction causing severe rash involving mucus membranes or skin necrosis: No Has patient had a PCN reaction that required hospitalization: Yes Has patient had a PCN reaction occurring within the last 10 years: No If all of the above answers are "NO", then may proceed with Cephalosporin use.    Antimicrobials this admission: 7/24 Vancomycin >>   Microbiology results: 7/24 BCx: [id number]  Thank you for allowing pharmacy to be a part of this patient's care.  Celene Squibb, PharmD Clinical Pharmacist 11/02/2022 9:43 AM

## 2022-11-02 NOTE — Discharge Summary (Signed)
Physician Discharge Summary  Aaron Knox JOA:[id number] DOB: 11-18-1955 DOA: 11/01/2022  PCP: Pcp, No  Admit date: 11/01/2022 Discharge date: 11/02/2022  Time spent: 40 minutes  Recommendations for Outpatient Follow-up:  Follow outpatient CBC/CMP  Follow with vascular outpatient    Discharge Diagnoses:  Principal Problem:   Cellulitis of right groin Active Problems:   Coronary artery disease involving native coronary artery without angina pectoris   Centrilobular emphysema (HCC)   Surgical site infection   HTN (hypertension)   Discharge Condition: stable  Diet recommendation: heart healthy  Filed Weights   10/31/22 2231  Weight: 88 kg    History of present illness:   Aaron Knox is Aaron Knox 67 y.o. male with medical history significant for CAD, PAD, emphysema, HTN S/p right femoral endarterectomy in June 2024 who presents the ED with pain redness and swelling at the area of intervention.  Patient states since his procedure he has had Aaron Knox swelling in the right groin with numbness going down the medial aspect of the right thigh with firmness and swelling however it has been gradually improving.  On 7/23, the area became increasingly numb, red and tender so he decided to come in.   CT imaging showed mildly rim enhancing fluid collections.  Vascular suspects seroma.  Planning to follow up after oral antibiotics.  See below for additional details  Hospital Course:  Assessment and Plan:   Cellulitis of right groin Surgical site infection CT with skin thickening and subcutaneous edema/soft tissue stranding within right groin and upper thigh - several mildly rim enhancing fluid collections within the right groin superficial to the common femoral and superficial collections Vascular consulted Dr. Wyn Quaker -> discussed with Dr. Wyn Quaker today, he notes concern for seroma and recommending outpatient follow up after course of oral antibiotics  Will discharge with duricef, penicillin  allergy noted, discusswed with pharmacy  Follow venous US - without DVT, simple appearing fluid collections in R groin (post op seroma vs resolving hemorrhage Afebrile, normal WBC count   Peripheral Artery Disease S/p femoral endarterectomy, insertion of iliac stent  Continue plavix, statin   HTN (hypertension) Not on home meds   Centrilobular emphysema (HCC) DuoNebs as needed.  Currently not exacerbated   Coronary artery disease involving native coronary artery without angina pectoris Continue atorvastatin Resume plavix    Procedures: LE US IMPRESSION: 1. No evidence of right lower extremity deep vein thrombosis. The right common femoral vein and profunda femoral vein are difficult to assess due to depth and overlying fluid collection. 2. Two relatively simple appearing fluid collections in the right groin. These may represent postoperative seromas or resolving areas of hemorrhage after prior endarterectomy in June.   Consultations: Vascular   Discharge Exam: Vitals:   11/02/22 0802 11/02/22 1543  BP: 121/72 96/71  Pulse: 66 94  Resp: 16 17  Temp: 97.7 F (36.5 C) 97.9 F (36.6 C)  SpO2: 96% 98%   No complaints Eager to discharge  General: No acute distress. Cardiovascular: RRR Lungs: unlabored R groin with healing incision, mild redness, induration - not TTP  Extremities: No clubbing or cyanosis. No edema.  Discharge Instructions   Discharge Instructions     Call MD for:  difficulty breathing, headache or visual disturbances   Complete by: As directed    Call MD for:  extreme fatigue   Complete by: As directed    Call MD for:  hives   Complete by: As directed    Call MD for:  persistant dizziness  or light-headedness   Complete by: As directed    Call MD for:  persistant nausea and vomiting   Complete by: As directed    Call MD for:  redness, tenderness, or signs of infection (pain, swelling, redness, odor or green/yellow discharge around incision  site)   Complete by: As directed    Call MD for:  severe uncontrolled pain   Complete by: As directed    Call MD for:  temperature >100.4   Complete by: As directed    Diet - low sodium heart healthy   Complete by: As directed    Discharge instructions   Complete by: As directed    You were seen for right inguinal swelling and concern for cellulitis (skin infection).  Your imaging showed fluid collections.  Vascular surgery does not currently think these represent infections.  Your ultrasound did not show any blood clots.  We'll plan to send you home with antibiotics.  Follow up with vascular outpatient.  Return for new, recurrent, or worsening symptoms.    Please ask your PCP to request records from this hospitalization so they know what was done and what the next steps will be.   Increase activity slowly   Complete by: As directed       Allergies as of 11/02/2022       Reactions   Aspirin Anaphylaxis   Penicillins Other (See Comments)   Childhood allergy per pt's mother. He does not recall any details of the reaction. Willing to try beta lactams.        Medication List     STOP taking these medications    oxyCODONE-acetaminophen 5-325 MG tablet Commonly known as: PERCOCET/ROXICET       TAKE these medications    atorvastatin 80 MG tablet Commonly known as: LIPITOR Take 1 tablet (80 mg total) by mouth daily at 6 PM. What changed: when to take this   cefadroxil 500 MG capsule Commonly known as: DURICEF Take 1 capsule (500 mg total) by mouth 2 (two) times daily for 5 days.   clopidogrel 75 MG tablet Commonly known as: PLAVIX Take 75 mg by mouth daily.       Allergies  Allergen Reactions   Aspirin Anaphylaxis   Penicillins Other (See Comments)    Childhood allergy per pt's mother. He does not recall any details of the reaction. Willing to try beta lactams.      The results of significant diagnostics from this hospitalization (including imaging,  microbiology, ancillary and laboratory) are listed below for reference.    Significant Diagnostic Studies: US Venous Img Lower Unilateral Right (DVT)  Result Date: 11/01/2022 CLINICAL DATA:  Right lower extremity pain and edema. Status post right femoral endarterectomy in June. Focal fluid collections in the right inguinal region by CT yesterday. EXAM: RIGHT LOWER EXTREMITY VENOUS DOPPLER ULTRASOUND TECHNIQUE: Gray-scale sonography with graded compression, as well as color Doppler and duplex ultrasound were performed to evaluate the lower extremity deep venous systems from the level of the common femoral vein and including the common femoral, femoral, profunda femoral, popliteal and calf veins including the posterior tibial, peroneal and gastrocnemius veins when visible. The superficial great saphenous vein was also interrogated. Spectral Doppler was utilized to evaluate flow at rest and with distal augmentation maneuvers in the common femoral, femoral and popliteal veins. COMPARISON:  None Available. FINDINGS: Contralateral Common Femoral Vein: Respiratory phasicity is normal and symmetric with the symptomatic side. No evidence of thrombus. Normal compressibility. Common Femoral Vein: The common  femoral vein is difficult to assess as it is deep to Shateka Petrea large fluid collection. No definite thrombus identified. Saphenofemoral Junction: The saphenofemoral junction is not well visualized. Profunda Femoral Vein: The profunda femoral vein is not well visualized due to depth. Femoral Vein: No evidence of thrombus. Normal compressibility, respiratory phasicity and response to augmentation. Popliteal Vein: No evidence of thrombus. Normal compressibility, respiratory phasicity and response to augmentation. Calf Veins: No evidence of thrombus. Normal compressibility and flow on color Doppler imaging. Superficial Great Saphenous Vein: No evidence of thrombus. Normal compressibility. Venous Reflux:  None. Other Findings:  Relatively simple appearing cystic fluid collections are present in the right groin. Collection measuring approximately 4.9 x 2.6 x 2.5 cm lies superficial to the common femoral artery and vein. Additional inguinal fluid collection measures approximately 6.7 x 1.6 x 3.3 cm. No flow is present in these collections and these may represent postoperative seromas or resolving areas of hemorrhage after prior endarterectomy in June. IMPRESSION: 1. No evidence of right lower extremity deep vein thrombosis. The right common femoral vein and profunda femoral vein are difficult to assess due to depth and overlying fluid collection. 2. Two relatively simple appearing fluid collections in the right groin. These may represent postoperative seromas or resolving areas of hemorrhage after prior endarterectomy in June. Electronically Signed   By: Irish Lack M.D.   On: 11/01/2022 15:53   CT ABDOMEN PELVIS W CONTRAST  Result Date: 10/31/2022 CLINICAL DATA:  Right-sided groin swelling EXAM: CT ABDOMEN AND PELVIS WITH CONTRAST TECHNIQUE: Multidetector CT imaging of the abdomen and pelvis was performed using the standard protocol following bolus administration of intravenous contrast. RADIATION DOSE REDUCTION: This exam was performed according to the departmental dose-optimization program which includes automated exposure control, adjustment of the mA and/or kV according to patient size and/or use of iterative reconstruction technique. CONTRAST:  OMNIPAQUE IOHEXOL 300 MG/ML  SOLN COMPARISON:  09/20/2022 FINDINGS: Lower chest: Lung bases demonstrate no acute airspace disease. Small hiatal hernia Hepatobiliary: No focal liver abnormality is seen. No gallstones, gallbladder wall thickening, or biliary dilatation. Pancreas: Unremarkable. No pancreatic ductal dilatation or surrounding inflammatory changes. Spleen: Normal in size without focal abnormality. Adrenals/Urinary Tract: Adrenal glands are unremarkable. Kidneys show no  hydronephrosis. Cysts and subcentimeter hypodensities in the kidneys too small to further characterize, no imaging follow-up is recommended. Right posterior bladder diverticulum. Stomach/Bowel: Stomach is within normal limits. Appendix appears normal. No evidence of bowel wall thickening, distention, or inflammatory changes. Vascular/Lymphatic: Advanced aortic atherosclerosis. Ectatic left common iliac artery of to 16 cm extends into the right external iliac artery. Reproductive: Prostate is unremarkable. Other: Negative for pelvic effusion or free air. Skin thickening and subcutaneous soft tissue stranding in the right groin. Slightly rim enhancing fluid collections within the right groin, anterior to the common femoral vessels. Cranial collection measures 3.5 by 3.3 by 6 cm. The more caudal collection anterior to the superficial femoral artery measures 3.6 by 2.6 by 7.1 cm. Musculoskeletal: No acute osseous abnormality IMPRESSION: 1. Status post stent placement extending from the right common iliac artery through the right external iliac artery. Skin thickening and subcutaneous edema/soft tissue stranding within the right groin and upper thigh. Several mildly rim enhancing fluid collections within the right groin superficial to the common femoral and superficial femoral vessels. These could represent postoperative fluid collections, sterility is indeterminate by this CT. No internal gas within the fluid collections. 2. Kidneys show no hydronephrosis. There is Chayson Charters small right posterior bladder diverticulum. 3. Aortic atherosclerosis.  Aortic Atherosclerosis (ICD10-I70.0). Electronically Signed   By: Jasmine Pang M.D.   On: 10/31/2022 23:35   VAS [place] ABI WITH/WO TBI  Result Date: 10/18/2022  LOWER EXTREMITY DOPPLER STUDY Patient Name:  Yakub Lodes [facility]  Date of Exam:   10/17/2022 Medical Rec #: [id number]           Accession #:    [id number] Date of Birth: [birth year]           Patient Gender: M Patient Age:   57  years Exam Location:  [facility] Procedure:      VAS [place] ABI WITH/WO TBI Referring Phys: Levora Dredge --------------------------------------------------------------------------------  Indications: Claudication, and peripheral artery disease. High Risk Factors: Hyperlipidemia, prior MI, coronary artery disease.  Vascular Interventions: 08/07/2022: Catheter placement into Right CIA from Left                         Femoral approach. Aortogram and Selective Right Lower                         Extremity Angiogram. Comparison Study: 07/25/2022 Performing Technologist: Debbe Bales RVS  Examination Guidelines: Maleeka Sabatino complete evaluation includes at minimum, Doppler waveform signals and systolic blood pressure reading at the level of bilateral brachial, anterior tibial, and posterior tibial arteries, when vessel segments are accessible. Bilateral testing is considered an integral part of Jaclyne Haverstick complete examination. Photoelectric Plethysmograph (PPG) waveforms and toe systolic pressure readings are included as required and additional duplex testing as needed. Limited examinations for reoccurring indications may be performed as noted.  ABI Findings: +---------+------------------+-----+--------+--------+ Right    Rt Pressure (mmHg)IndexWaveformComment  +---------+------------------+-----+--------+--------+ Brachial 140                                     +---------+------------------+-----+--------+--------+ ATA      126               0.88 biphasic         +---------+------------------+-----+--------+--------+ PTA      135               0.94 biphasic         +---------+------------------+-----+--------+--------+ Great Toe128               0.89 Normal           +---------+------------------+-----+--------+--------+ +---------+------------------+-----+---------+-------+ Left     Lt Pressure (mmHg)IndexWaveform Comment +---------+------------------+-----+---------+-------+ Brachial  144                                     +---------+------------------+-----+---------+-------+ PTA      134               0.93 triphasic        +---------+------------------+-----+---------+-------+ PERO     117               0.84 biphasic         +---------+------------------+-----+---------+-------+ Great Toe118               0.82 Normal           +---------+------------------+-----+---------+-------+ +-------+-----------+-----------+------------+------------+ ABI/TBIToday's ABIToday's TBIPrevious ABIPrevious TBI +-------+-----------+-----------+------------+------------+ Right  .94        .89        .53         .  48          +-------+-----------+-----------+------------+------------+ Left   .93        .82        1.04        .86          +-------+-----------+-----------+------------+------------+ Right ABIs and TBIs appear increased compared to prior study on 07/25/2022. Left ABIs and TBIs appear decreased compared to prior study on 07/25/2022.  Summary: Right: Resting right ankle-brachial index indicates mild right lower extremity arterial disease. The right toe-brachial index is normal. Left: Resting left ankle-brachial index indicates mild left lower extremity arterial disease. The left toe-brachial index is normal. *See table(s) above for measurements and observations.   Electronically signed by Levora Dredge MD on 10/18/2022 at 7:25:40 AM.    Final     Microbiology: Recent Results (from the past 240 hour(s))  Blood culture (routine x 2)     Status: None (Preliminary result)   Collection Time: 11/01/22  4:40 AM   Specimen: BLOOD  Result Value Ref Range Status   Specimen Description BLOOD LEFT ARM  Final   Special Requests   Final    BOTTLES DRAWN AEROBIC AND ANAEROBIC Blood Culture adequate volume   Culture   Final    NO GROWTH 1 DAY Performed at [facility], [address], [place], [place] [address]    Report Status PENDING  Incomplete   Blood culture (routine x 2)     Status: None (Preliminary result)   Collection Time: 11/01/22  4:40 AM   Specimen: BLOOD  Result Value Ref Range Status   Specimen Description BLOOD LEFT AC  Final   Special Requests   Final    BOTTLES DRAWN AEROBIC AND ANAEROBIC Blood Culture results may not be optimal due to an inadequate volume of blood received in culture bottles   Culture   Final    NO GROWTH 1 DAY Performed at [facility], [address] [place], [place] [address]    Report Status PENDING  Incomplete     Labs: Basic Metabolic Panel: Recent Labs  Lab 10/31/22 2233 11/02/22 0448  NA 136 138  K 3.3* 3.7  CL 104 108  CO2 25 24  GLUCOSE 107* 98  BUN 14 12  CREATININE 1.00 0.89  CALCIUM 9.0 8.6*  MG  --  2.1  PHOS  --  3.7   Liver Function Tests: Recent Labs  Lab 10/31/22 2233 11/02/22 0448  AST 23 22  ALT 23 22  ALKPHOS 108 83  BILITOT 0.4 0.7  PROT 7.2 6.0*  ALBUMIN 4.4 3.5   No results for input(s): "LIPASE", "AMYLASE" in the last 168 hours. No results for input(s): "AMMONIA" in the last 168 hours. CBC: Recent Labs  Lab 10/31/22 2233 11/02/22 0448  WBC 4.7 5.5  NEUTROABS 3.2 4.1  HGB 12.4* 11.5*  HCT 37.1* 33.2*  MCV 92.8 89.2  PLT 193 175   Cardiac Enzymes: No results for input(s): "CKTOTAL", "CKMB", "CKMBINDEX", "TROPONINI" in the last 168 hours. BNP: BNP (last 3 results) No results for input(s): "BNP" in the last 8760 hours.  ProBNP (last 3 results) No results for input(s): "PROBNP" in the last 8760 hours.  CBG: No results for input(s): "GLUCAP" in the last 168 hours.     Signed:  Lacretia Nicks MD.  [facility] 11/02/2022, 3:45 PM

## 2022-11-02 NOTE — Plan of Care (Signed)
  Problem: Clinical Measurements: Goal: Ability to avoid or minimize complications of infection will improve Outcome: Progressing   Problem: Skin Integrity: Goal: Skin integrity will improve Outcome: Progressing   Problem: Education: Goal: Knowledge of General Education information will improve Description: Including pain rating scale, medication(s)/side effects and non-pharmacologic comfort measures Outcome: Progressing   Problem: Clinical Measurements: Goal: Ability to maintain clinical measurements within normal limits will improve Outcome: Progressing   Problem: Coping: Goal: Level of anxiety will decrease Outcome: Progressing   Problem: Elimination: Goal: Will not experience complications related to urinary retention Outcome: Progressing

## 2022-11-03 ENCOUNTER — Telehealth (INDEPENDENT_AMBULATORY_CARE_PROVIDER_SITE_OTHER): Payer: Self-pay

## 2022-11-03 ENCOUNTER — Other Ambulatory Visit: Payer: Self-pay

## 2022-11-03 ENCOUNTER — Emergency Department: Payer: Medicare PPO

## 2022-11-03 ENCOUNTER — Emergency Department
Admission: EM | Admit: 2022-11-03 | Discharge: 2022-11-03 | Disposition: A | Discharge status: Home / Self Care | Payer: Medicare PPO | Attending: Emergency Medicine | Admitting: Emergency Medicine

## 2022-11-03 DIAGNOSIS — I2510 Atherosclerotic heart disease of native coronary artery without angina pectoris: Secondary | ICD-10-CM | POA: Insufficient documentation

## 2022-11-03 DIAGNOSIS — I97648 Postprocedural seroma of a circulatory system organ or structure following other circulatory system procedure: Secondary | ICD-10-CM | POA: Insufficient documentation

## 2022-11-03 DIAGNOSIS — I10 Essential (primary) hypertension: Secondary | ICD-10-CM | POA: Insufficient documentation

## 2022-11-03 DIAGNOSIS — R2241 Localized swelling, mass and lump, right lower limb: Secondary | ICD-10-CM | POA: Diagnosis present

## 2022-11-03 LAB — CBC
HCT: 36.4 % — ABNORMAL LOW (ref 39.0–52.0)
Hemoglobin: 12.6 g/dL — ABNORMAL LOW (ref 13.0–17.0)
MCH: 31.3 pg (ref 26.0–34.0)
MCHC: 34.6 g/dL (ref 30.0–36.0)
MCV: 90.3 fL (ref 80.0–100.0)
Platelets: 160 10*3/uL (ref 150–400)
RBC: 4.03 MIL/uL — ABNORMAL LOW (ref 4.22–5.81)
RDW: 12.1 % (ref 11.5–15.5)
WBC: 4.8 10*3/uL (ref 4.0–10.5)
nRBC: 0 % (ref 0.0–0.2)

## 2022-11-03 LAB — COMPREHENSIVE METABOLIC PANEL
ALT: 24 U/L (ref 0–44)
AST: 33 U/L (ref 15–41)
Albumin: 4 g/dL (ref 3.5–5.0)
Alkaline Phosphatase: 100 U/L (ref 38–126)
Anion gap: 11 (ref 5–15)
BUN: 12 mg/dL (ref 8–23)
CO2: 19 mmol/L — ABNORMAL LOW (ref 22–32)
Calcium: 9.1 mg/dL (ref 8.9–10.3)
Chloride: 108 mmol/L (ref 98–111)
Creatinine, Ser: 1.06 mg/dL (ref 0.61–1.24)
GFR, Estimated: >60 mL/min (ref >60)
Glucose, Bld: 109 mg/dL — ABNORMAL HIGH (ref 70–99)
Potassium: 3.6 mmol/L (ref 3.5–5.1)
Sodium: 138 mmol/L (ref 135–145)
Total Bilirubin: 0.9 mg/dL (ref 0.3–1.2)
Total Protein: 7 g/dL (ref 6.5–8.1)

## 2022-11-03 MED ORDER — SULFAMETHOXAZOLE-TRIMETHOPRIM 800-160 MG PO TABS: 1.0000 | ORAL_TABLET | Freq: Two times a day (BID) | ORAL | 0 refills | Status: DC

## 2022-11-03 MED ORDER — CEFADROXIL 500 MG PO CAPS
500.0000 mg | ORAL_CAPSULE | ORAL | Status: AC
  Administered 2022-11-03: 500 mg via ORAL
  Filled 2022-11-03: qty 1

## 2022-11-03 MED ORDER — SULFAMETHOXAZOLE-TRIMETHOPRIM 800-160 MG PO TABS
1.0000 | ORAL_TABLET | Freq: Once | ORAL | Status: AC
  Administered 2022-11-03: 1 via ORAL
  Filled 2022-11-03: qty 1

## 2022-11-03 NOTE — ED Triage Notes (Addendum)
Pt to ed from home via POV for same symptoms as he was seen for on Tuesday this week. Swelling in his groin area. He was admitted and was given IV abx and DC in 24 hours.  Pt is caox4, in no acute distress and ambulatory in triage. Pt states he was informed if it happened again to come back for IV abx. Pt is afebrile and not tachycardic in triage.

## 2022-11-03 NOTE — ED Notes (Signed)
Food offered, but declined. [place] now at bedside.

## 2022-11-03 NOTE — ED Provider Notes (Signed)
[facility] Provider Note    Event Date/Time   First MD Initiated Contact with Patient 11/03/22 1633     (approximate)   History   Chief Complaint: Post-op Problem   HPI  Aaron Knox is a 67 y.o. male with a history of CAD, hypertension, PAD who comes ED complaining of right leg swelling and groin swelling since this morning.  Patient was recently hospitalized due to cellulitis of the right leg after recent vascular surgery procedure at the femoral artery.  He received IV vancomycin for 2 days with improvement, was discharged home on cefadroxil, which he took as prescribed this morning.  However, he has had increased swelling and redness of the right lower leg today.  No chest pain shortness of breath or fever.  Reviewed outside records from his hospitalization where he had reassuring ultrasound but did have simple fluid collection at the right proximal thigh.  This was felt to be seroma by the surgical team.     Physical Exam   Triage Vital Signs: ED Triage Vitals  Encounter Vitals Group     BP 11/03/22 1610 (!) 168/133     Systolic BP Percentile --      Diastolic BP Percentile --      Pulse Rate 11/03/22 1610 100     Resp 11/03/22 1610 16     Temp 11/03/22 1610 98 F (36.7 C)     Temp Source 11/03/22 1610 Oral     SpO2 11/03/22 1610 98 %     Weight 11/03/22 1611 191 lb 12.8 oz (87 kg)     Height 11/03/22 1611 6\' 2"  (1.88 m)     Head Circumference --      Peak Flow --      Pain Score 11/03/22 1611 0     Pain Loc --      Pain Education --      Exclude from Growth Chart --     Most recent vital signs: Vitals:   11/03/22 1610 11/03/22 1856  BP: (!) 168/133 (!) 158/82  Pulse: 100 88  Resp: 16 16  Temp: 98 F (36.7 C) 98.2 F (36.8 C)  SpO2: 98% 99%    General: Awake, no distress.  CV:  Good peripheral perfusion.  Normal DP pulses.  Regular rate. Resp:  Normal effort.  Abd:  No distention.  Other:  Right lower extremity with  mild diffuse edema and easy erythema.  Palpable fluid collection at the right proximal thigh medial to his surgical incision.  No inflammatory changes over the incision or the fluid collection.  No crepitus.   ED Results / Procedures / Treatments   Labs (all labs ordered are listed, but only abnormal results are displayed) Labs Reviewed  CBC - Abnormal; Notable for the following components:      Result Value   RBC 4.03 (*)    Hemoglobin 12.6 (*)    HCT 36.4 (*)    All other components within normal limits  COMPREHENSIVE METABOLIC PANEL - Abnormal; Notable for the following components:   CO2 19 (*)    Glucose, Bld 109 (*)    All other components within normal limits     EKG    RADIOLOGY Ultrasound right lower extremity interpreted by me, negative for DVT.  Radiology report reviewed noting persistent seroma which is somewhat decreased in size   PROCEDURES:  Procedures   MEDICATIONS ORDERED IN ED: Medications  sulfamethoxazole-trimethoprim (BACTRIM DS) 800-160 MG per tablet 1  tablet (1 tablet Oral Given 11/03/22 1737)  cefadroxil (DURICEF) capsule 500 mg (500 mg Oral Given 11/03/22 1736)     IMPRESSION / MDM / ASSESSMENT AND PLAN / ED COURSE  I reviewed the triage vital signs and the nursing notes.  DDx: DVT, accumulating seroma, subtherapeutic antibiotic coverage, AKI  Patient's presentation is most consistent with acute illness / injury with system symptoms.  Presents with increased welling in the right groin area of previously identified seroma.  No specific tenderness or overlying inflammatory changes.  Antibiotics appear to be effective for eradicating his cellulitis from the surgical site, but with increasing redness in the distal extremity, will add Bactrim for MRSA coverage.  MRSA screen was negative in the hospital, but since Duricef is narrower spectrum than vancomycin and he is having increasing symptoms, will broaden coverage..  Discussed with Dr. Wyn Quaker who  recommends continuing current plan and outpatient follow-up, no new recommendations or interventions.  Labs unremarkable.  Stable for discharge.       FINAL CLINICAL IMPRESSION(S) / ED DIAGNOSES   Final diagnoses:  Postoperative seroma involving circulatory system after other circulatory system procedure     Rx / DC Orders   ED Discharge Orders          Ordered    sulfamethoxazole-trimethoprim (BACTRIM DS) 800-160 MG tablet  2 times daily        11/03/22 1832             Note:  This document was prepared using Dragon voice recognition software and may include unintentional dictation errors.   Sharman Cheek, MD 11/04/22 [phone]

## 2022-11-03 NOTE — Telephone Encounter (Signed)
Pt called stating that he was discharged from ED yesterday and told if he had any swelling in the areas of concern to call our office.  I advised him to go to the ED and let them evaluate him.  Pt states understanding and says he will go.

## 2022-11-03 NOTE — Discharge Instructions (Addendum)
Your lab tests and leg ultrasound today were okay.  Continue taking cefadroxil (Duricef) that was prescribed from the hospital, and add on sulfamethoxazole (Bactrim) as well.  You will take both medicines twice a day until completed.  Follow up with Dr. Wyn Quaker in the office.

## 2022-11-03 NOTE — ED Notes (Signed)
Lactic sent down.

## 2022-11-14 ENCOUNTER — Encounter (INDEPENDENT_AMBULATORY_CARE_PROVIDER_SITE_OTHER): Payer: Self-pay | Admitting: Nurse Practitioner

## 2022-11-14 ENCOUNTER — Ambulatory Visit (INDEPENDENT_AMBULATORY_CARE_PROVIDER_SITE_OTHER): Payer: Medicare PPO | Admitting: Nurse Practitioner

## 2022-11-14 VITALS — BP 130/75 | HR 76 | Resp 16 | Wt 192.2 lb

## 2022-11-14 DIAGNOSIS — I739 Peripheral vascular disease, unspecified: Secondary | ICD-10-CM

## 2022-11-14 DIAGNOSIS — Z9889 Other specified postprocedural states: Secondary | ICD-10-CM

## 2022-11-14 NOTE — Progress Notes (Signed)
Subjective:    Patient ID: Aaron Knox, male    DOB: 11-14-1955, 67 y.o.   MRN: [id number] Chief Complaint  Patient presents with   Follow-up    4 week follow up    Patient returns today for wound evaluation.  His incision is well-healed following a right femoral endarterectomy, however the patient subsequently developed a seroma with cellulitis in the area.  The patient currently has no evidence of cellulitis but there is definitely noticeable seroma in the area.  He also still continues to have some swelling on the right lower extremity.  He has not yet utilize compression socks but he is very active.  He walks several miles per day.  He notes that the area is variably uncomfortable at times.  He denies any fevers or chills.  There is no open wounds or ulcerations.  He does note that his walking is much much better postintervention.    Review of Systems  Cardiovascular:  Positive for leg swelling.  All other systems reviewed and are negative.      Objective:   Physical Exam Vitals reviewed.  HENT:     Head: Normocephalic.  Cardiovascular:     Rate and Rhythm: Normal rate.  Pulmonary:     Effort: Pulmonary effort is normal.  Skin:    General: Skin is warm and dry.     Comments: Swelling in right groin  Neurological:     Mental Status: He is alert and oriented to person, place, and time.  Psychiatric:        Mood and Affect: Mood normal.        Behavior: Behavior normal.        Thought Content: Thought content normal.     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)    CAD in native artery    a.) LHC/PCI 08/09/2014 (setting of STEMI): 30% mLAD, 99% pLCx (3.0 x 15 mm Resolute Integrity DES x 1 with 10% residual post-intervention stenosis), 45% mLCx, 20% pRCA, 40% dRCA); b.) LHC 01/29/2018: 30% mLAD,  45% mLCx, 50% pRCA, 80% dRCA --> med mgmt   DDD (degenerative disc disease), lumbar    Diastolic dysfunction    a.) TTE 08/11/2014 (s/p STEMI): EF 45-50%, inf HK, AoV sclerosis, G1DD; b.) TTE 02/28/2018: EF 55-60%, G1DD   History of bilateral cataract extraction 2023   HTN (hypertension)    Hyperlipidemia    Long term current use of antithrombotics/antiplatelets    a.) clopidogrel   ST elevation myocardial infarction (STEMI) of true posterior wall (HCC) 08/09/2014   a.) LHC/PCI 08/09/2014: 3.0 x 15 mm Resolute Integrity DES x 1 pLCx with 10% residual post-intervention stenosis)   Unstable angina (HCC)     Social History   Socioeconomic History   Marital status: Single    Spouse name: Not on file   Number of children: 1   Years of education: Not on file   Highest education level: Not on file  Occupational History   Not on file  Tobacco Use   Smoking status: Former    Current packs/day: 0.00    Types: Cigarettes    Quit date: 08/2022    Years since quitting: 0.2   Smokeless tobacco: Never  Vaping Use   Vaping status:  Never Used  Substance and Sexual Activity   Alcohol use: Yes    Alcohol/week: 3.0 standard drinks of alcohol    Types: 3 Glasses of wine per week    Comment: weekends   Drug use: No   Sexual activity: Not on file  Other Topics Concern   Not on file  Social History Narrative   Lives alone   Social Determinants of Health   Financial Resource Strain: Not on file  Food Insecurity: No Food Insecurity (11/01/2022)   Hunger Vital Sign    Worried About Running Out of Food in the Last Year: Never true    Ran Out of Food in the Last Year: Never true  Transportation Needs: No Transportation Needs (11/01/2022)   PRAPARE - Administrator, Civil Service (Medical): No    Lack of Transportation (Non-Medical): No  Physical Activity: Not on file  Stress: Not on file  Social Connections: Not on file  Intimate Partner Violence: Not At Risk (11/01/2022)    Humiliation, Afraid, Rape, and Kick questionnaire    Fear of Current or Ex-Partner: No    Emotionally Abused: No    Physically Abused: No    Sexually Abused: No    Past Surgical History:  Procedure Laterality Date   CARDIAC CATHETERIZATION N/A 08/09/2014   Procedure: Left Heart Cath and Coronary Angiography;  Surgeon: Iran Ouch, MD;  [facility]: [facility];  Service: Cardiovascular;  Laterality: N/A;   CARDIAC CATHETERIZATION N/A 08/09/2014   Procedure: Coronary Stent Intervention;  Surgeon: Iran Ouch, MD;  Location: [facility];  Service: Cardiovascular;  Laterality: N/A;   CATARACT EXTRACTION W/ INTRAOCULAR LENS  IMPLANT, BILATERAL Bilateral 2023   CORONARY ANGIOPLASTY  08/09/2014   ENDARTERECTOMY FEMORAL Right 09/20/2022   Procedure: ENDARTERECTOMY FEMORAL;  Surgeon: Annice Needy, MD;  Location: [facility];  Service: Vascular;  Laterality: Right;   HAND SURGERY Left 1980   bone chip with nerve damage   INSERTION OF ILIAC STENT Right 09/20/2022   Procedure: INSERTION OF ILIAC STENT;  Surgeon: Annice Needy, MD;  Location: [facility];  Service: Vascular;  Laterality: Right;   LEFT HEART CATH AND CORONARY ANGIOGRAPHY N/A 01/29/2018   Procedure: LEFT HEART CATH AND CORONARY ANGIOGRAPHY;  Surgeon: Yvonne Kendall, MD;  Location: [facility];  Service: Cardiovascular;  Laterality: N/A;   LOWER EXTREMITY ANGIOGRAPHY Right 08/07/2022   Procedure: Lower Extremity Angiography;  Surgeon: Annice Needy, MD;  Location: [facility];  Service: Cardiovascular;  Laterality: Right;   nerve reconstruction Left 1980   hand    Family History  Problem Relation Age of Onset   CAD Maternal Grandfather     Allergies  Allergen Reactions   Aspirin Anaphylaxis   Penicillins Other (See Comments)    Childhood allergy per pt's mother. He does not recall any details of the reaction. Willing to try beta lactams.       Latest Ref Rng & Units 11/03/2022    4:21 PM  11/02/2022    4:48 AM 10/31/2022   10:33 PM  CBC  WBC 4.0 - 10.5 K/uL 4.8  5.5  4.7   Hemoglobin 13.0 - 17.0 g/dL 44.0  34.7  42.5   Hematocrit 39.0 - 52.0 % 36.4  33.2  37.1   Platelets 150 - 400 K/uL 160  175  193       CMP     Component Value Date/Time   NA 138 11/03/2022 1621  K 3.6 11/03/2022 1621   CL 108 11/03/2022 1621   CO2 19 (L) 11/03/2022 1621   GLUCOSE 109 (H) 11/03/2022 1621   BUN 12 11/03/2022 1621   CREATININE 1.06 11/03/2022 1621   CALCIUM 9.1 11/03/2022 1621   PROT 7.0 11/03/2022 1621   ALBUMIN 4.0 11/03/2022 1621   AST 33 11/03/2022 1621   ALT 24 11/03/2022 1621   ALKPHOS 100 11/03/2022 1621   BILITOT 0.9 11/03/2022 1621   GFRNONAA >60 11/03/2022 1621     VAS [place] ABI WITH/WO TBI  Result Date: 10/18/2022  LOWER EXTREMITY DOPPLER STUDY Patient Name:  Nyheem Maduro [facility]  Date of Exam:   10/17/2022 Medical Rec #: [id number]           Accession #:    [id number] Date of Birth: [birth year]           Patient Gender: M Patient Age:   25 years Exam Location:  [facility] Procedure:      VAS [place] ABI WITH/WO TBI Referring Phys: Levora Dredge --------------------------------------------------------------------------------  Indications: Claudication, and peripheral artery disease. High Risk Factors: Hyperlipidemia, prior MI, coronary artery disease.  Vascular Interventions: 08/07/2022: Catheter placement into Right CIA from Left                         Femoral approach. Aortogram and Selective Right Lower                         Extremity Angiogram. Comparison Study: 07/25/2022 Performing Technologist: Debbe Bales RVS  Examination Guidelines: A complete evaluation includes at minimum, Doppler waveform signals and systolic blood pressure reading at the level of bilateral brachial, anterior tibial, and posterior tibial arteries, when vessel segments are accessible. Bilateral testing is considered an integral part of a complete examination. Photoelectric Plethysmograph  (PPG) waveforms and toe systolic pressure readings are included as required and additional duplex testing as needed. Limited examinations for reoccurring indications may be performed as noted.  ABI Findings: +---------+------------------+-----+--------+--------+ Right    Rt Pressure (mmHg)IndexWaveformComment  +---------+------------------+-----+--------+--------+ Brachial 140                                     +---------+------------------+-----+--------+--------+ ATA      126               0.88 biphasic         +---------+------------------+-----+--------+--------+ PTA      135               0.94 biphasic         +---------+------------------+-----+--------+--------+ Great Toe128               0.89 Normal           +---------+------------------+-----+--------+--------+ +---------+------------------+-----+---------+-------+ Left     Lt Pressure (mmHg)IndexWaveform Comment +---------+------------------+-----+---------+-------+ Brachial 144                                     +---------+------------------+-----+---------+-------+ PTA      134               0.93 triphasic        +---------+------------------+-----+---------+-------+ PERO     117               0.84 biphasic         +---------+------------------+-----+---------+-------+  Great Toe118               0.82 Normal           +---------+------------------+-----+---------+-------+ +-------+-----------+-----------+------------+------------+ ABI/TBIToday's ABIToday's TBIPrevious ABIPrevious TBI +-------+-----------+-----------+------------+------------+ Right  .94        .89        .53         .48          +-------+-----------+-----------+------------+------------+ Left   .93        .82        1.04        .86          +-------+-----------+-----------+------------+------------+ Right ABIs and TBIs appear increased compared to prior study on 07/25/2022. Left ABIs and TBIs appear decreased  compared to prior study on 07/25/2022.  Summary: Right: Resting right ankle-brachial index indicates mild right lower extremity arterial disease. The right toe-brachial index is normal. Left: Resting left ankle-brachial index indicates mild left lower extremity arterial disease. The left toe-brachial index is normal. *See table(s) above for measurements and observations.   Electronically signed by Levora Dredge MD on 10/18/2022 at 7:25:40 AM.    Final        Assessment & Plan:   1. Peripheral arterial disease with history of revascularization (HCC) Overall the patient is doing well post femoral endarterectomy but he does have a seroma in the area.  It has previously caused infection but he has not at this time.  Will continue to watch carefully.  We will allow time for her to resolve without intervention.  We did discuss that intervention typically would entail draining the seroma and possibly placing a wound vacuum or packing the area.  The patient is advised that if the swelling returns or worsens, or if he develops any new or worsening cellulitic symptoms to contact our office for sooner follow-up evaluations.  Will also add noninvasive studies to evaluate his perfusion in addition to the size of the seroma as well.   Current Outpatient Medications on File Prior to Visit  Medication Sig Dispense Refill   atorvastatin (LIPITOR) 80 MG tablet Take 1 tablet (80 mg total) by mouth daily at 6 PM. (Patient taking differently: Take 80 mg by mouth daily.) 90 tablet 3   clopidogrel (PLAVIX) 75 MG tablet Take 75 mg by mouth daily.     sulfamethoxazole-trimethoprim (BACTRIM DS) 800-160 MG tablet Take 1 tablet by mouth 2 (two) times daily. (Patient not taking: Reported on 11/14/2022) 14 tablet 0   No current facility-administered medications on file prior to visit.    There are no Patient Instructions on file for this visit. No follow-ups on file.   Georgiana Spinner, NP

## 2022-11-23 ENCOUNTER — Ambulatory Visit: Payer: Medicare PPO | Admitting: Medical

## 2022-12-01 ENCOUNTER — Ambulatory Visit: Payer: Medicare PPO | Admitting: Medical

## 2022-12-01 ENCOUNTER — Encounter: Payer: Self-pay | Admitting: Medical

## 2022-12-01 NOTE — Progress Notes (Deleted)
Cardiology Office Note:    Date:  12/01/2022   ID:  Aaron Knox, DOB 1956/02/24, MRN [id number]  PCP:  Pcp, No  [facility] Cardiologist:  Julien Nordmann, MD  [facility] Electrophysiologist:  None   Referring MD: No ref. provider found   Chief Complaint: ***  History of Present Illness:    Aaron Knox is a 67 y.o. male with a hx of coronary artery disease status post PCI/DES to proximal left circumflex, hyperlipidemia, tobacco use, allergy to aspirin, presents for overdue follow-up.   Patient has a history of CAD with STEMI in May 2016 with PCI and DES placement to the proximal left circumflex.  Cardiac cath in 2019 showed significant single-vessel CAD with 50% proximal and 90% distal RCA; RCA supplies relatively small PDA and PL branches.  Mild to moderate, nonobstructive CAD involving the left coronary artery with widely patent stent in the proximal left circumflex.  Medical therapy was recommended and Imdur was added.  Refractory chest pain, can consider PCI to the RCA.  Echo showed LVEF 55 to 60%, grade 1 diastolic dysfunction.  Patient was last seen in May 2024 for preop cardiac evaluation prior to right femoral endarterectomy of right iliac stents.  Patient was overall stable from a cardiac perspective and no further workup was pursued.  Patient has subsequent cellulitis of the right groin treated with antibiotics.  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)    CAD in native artery    a.) LHC/PCI 08/09/2014 (setting of STEMI): 30% mLAD, 99% pLCx (3.0 x 15 mm Resolute Integrity DES x 1 with 10% residual post-intervention stenosis), 45% mLCx, 20% pRCA, 40% dRCA); b.) LHC 01/29/2018: 30% mLAD, 45% mLCx, 50% pRCA, 80% dRCA --> med mgmt   DDD (degenerative disc disease), lumbar    Diastolic dysfunction    a.) TTE 08/11/2014 (s/p STEMI): EF 45-50%, inf HK, AoV sclerosis, G1DD; b.) TTE 02/28/2018: EF 55-60%,  G1DD   History of bilateral cataract extraction 2023   HTN (hypertension)    Hyperlipidemia    Long term current use of antithrombotics/antiplatelets    a.) clopidogrel   ST elevation myocardial infarction (STEMI) of true posterior wall (HCC) 08/09/2014   a.) LHC/PCI 08/09/2014: 3.0 x 15 mm Resolute Integrity DES x 1 pLCx with 10% residual post-intervention stenosis)   Unstable angina [facility])     Past Surgical History:  Procedure Laterality Date   CARDIAC CATHETERIZATION N/A 08/09/2014   Procedure: Left Heart Cath and Coronary Angiography;  Surgeon: Iran Ouch, MD;  [facility]: [facility];  Service: Cardiovascular;  Laterality: N/A;   CARDIAC CATHETERIZATION N/A 08/09/2014   Procedure: Coronary Stent Intervention;  Surgeon: Iran Ouch, MD;  Location: [facility];  Service: Cardiovascular;  Laterality: N/A;   CATARACT EXTRACTION W/ INTRAOCULAR LENS  IMPLANT, BILATERAL Bilateral 2023   CORONARY ANGIOPLASTY  08/09/2014   ENDARTERECTOMY FEMORAL Right 09/20/2022   Procedure: ENDARTERECTOMY FEMORAL;  Surgeon: Annice Needy, MD;  Location: [facility];  Service: Vascular;  Laterality: Right;   HAND SURGERY Left 1980   bone chip with nerve damage   INSERTION OF ILIAC STENT Right 09/20/2022   Procedure: INSERTION OF ILIAC STENT;  Surgeon: Annice Needy, MD;  Location: [facility];  Service: Vascular;  Laterality: Right;   LEFT HEART CATH AND CORONARY ANGIOGRAPHY N/A 01/29/2018   Procedure: LEFT HEART CATH AND CORONARY ANGIOGRAPHY;  Surgeon: Yvonne Kendall, MD;  Location:  [facility];  Service: Cardiovascular;  Laterality: N/A;   LOWER EXTREMITY ANGIOGRAPHY Right 08/07/2022   Procedure: Lower Extremity Angiography;  Surgeon: Annice Needy, MD;  Location: [facility];  Service: Cardiovascular;  Laterality: Right;   nerve reconstruction Left 1980   hand    Current Medications: No outpatient medications have been marked as taking for the 12/01/22 encounter  (Appointment) with Fransico Michael, Hamzah Savoca H, PA-C.     Allergies:   Aspirin and Penicillins   Social History   Socioeconomic History   Marital status: Single    Spouse name: Not on file   Number of children: 1   Years of education: Not on file   Highest education level: Not on file  Occupational History   Not on file  Tobacco Use   Smoking status: Former    Current packs/day: 0.00    Types: Cigarettes    Quit date: 08/2022    Years since quitting: 0.3   Smokeless tobacco: Never  Vaping Use   Vaping status: Never Used  Substance and Sexual Activity   Alcohol use: Yes    Alcohol/week: 3.0 standard drinks of alcohol    Types: 3 Glasses of wine per week    Comment: weekends   Drug use: No   Sexual activity: Not on file  Other Topics Concern   Not on file  Social History Narrative   Lives alone   Social Determinants of Health   Financial Resource Strain: Not on file  Food Insecurity: No Food Insecurity (11/01/2022)   Hunger Vital Sign    Worried About Running Out of Food in the Last Year: Never true    Ran Out of Food in the Last Year: Never true  Transportation Needs: No Transportation Needs (11/01/2022)   PRAPARE - Administrator, Civil Service (Medical): No    Lack of Transportation (Non-Medical): No  Physical Activity: Not on file  Stress: Not on file  Social Connections: Not on file     Family History: The patient's ***family history includes CAD in his maternal grandfather.  ROS:   Please see the history of present illness.    *** All other systems reviewed and are negative.  EKGs/Labs/Other Studies Reviewed:    The following studies were reviewed today: ***  EKG:  EKG is *** ordered today.  The ekg ordered today demonstrates ***  Recent Labs: 11/02/2022: Magnesium 2.1 11/03/2022: ALT 24; BUN 12; Creatinine, Ser 1.06; Hemoglobin 12.6; Platelets 160; Potassium 3.6; Sodium 138  Recent Lipid Panel    Component Value Date/Time   CHOL 195 08/22/2022  0939   TRIG 237 (H) 08/22/2022 0939   HDL 29 (L) 08/22/2022 0939   CHOLHDL 6.7 08/22/2022 0939   VLDL 47 (H) 08/22/2022 0939   LDLCALC 119 (H) 08/22/2022 0939     Risk Assessment/Calculations:   {Does this patient have ATRIAL FIBRILLATION?[phone]}   Physical Exam:    VS:  There were no vitals taken for this visit.    Wt Readings from Last 3 Encounters:  11/14/22 192 lb 3.2 oz (87.2 kg)  11/03/22 191 lb 12.8 oz (87 kg)  10/31/22 194 lb (88 kg)     GEN: *** Well nourished, well developed in no acute distress HEENT: Normal NECK: No JVD; No carotid bruits LYMPHATICS: No lymphadenopathy CARDIAC: ***RRR, no murmurs, rubs, gallops RESPIRATORY:  Clear to auscultation without rales, wheezing or rhonchi  ABDOMEN: Soft, non-tender, non-distended MUSCULOSKELETAL:  No edema; No deformity  SKIN: Warm  and dry NEUROLOGIC:  Alert and oriented x 3 PSYCHIATRIC:  Normal affect   ASSESSMENT:    No diagnosis found. PLAN:    In order of problems listed above:  ***  Disposition: Follow up {follow up:15908} with ***   Shared Decision Making/Informed Consent   {Are you ordering a CV Procedure (e.g. stress test, cath, DCCV, TEE, etc)?   Press F2        :[id number]}    Signed, Issai Werling Ardelle Lesches  12/01/2022 7:42 AM    [facility]

## 2022-12-21 ENCOUNTER — Other Ambulatory Visit (INDEPENDENT_AMBULATORY_CARE_PROVIDER_SITE_OTHER): Payer: Self-pay | Admitting: Nurse Practitioner

## 2022-12-21 DIAGNOSIS — Z9889 Other specified postprocedural states: Secondary | ICD-10-CM

## 2023-01-01 ENCOUNTER — Encounter (INDEPENDENT_AMBULATORY_CARE_PROVIDER_SITE_OTHER): Payer: Self-pay | Admitting: Nurse Practitioner

## 2023-01-01 ENCOUNTER — Ambulatory Visit (INDEPENDENT_AMBULATORY_CARE_PROVIDER_SITE_OTHER): Payer: Medicare PPO

## 2023-01-01 ENCOUNTER — Other Ambulatory Visit
Admission: RE | Admit: 2023-01-01 | Discharge: 2023-01-01 | Disposition: A | Discharge status: Home / Self Care | Payer: Medicare PPO | Source: Ambulatory Visit | Attending: Nurse Practitioner | Admitting: Nurse Practitioner

## 2023-01-01 ENCOUNTER — Ambulatory Visit (INDEPENDENT_AMBULATORY_CARE_PROVIDER_SITE_OTHER): Payer: Medicare PPO | Admitting: Nurse Practitioner

## 2023-01-01 VITALS — BP 162/75 | HR 54 | Resp 16 | Wt 190.0 lb

## 2023-01-01 DIAGNOSIS — Z9889 Other specified postprocedural states: Secondary | ICD-10-CM | POA: Insufficient documentation

## 2023-01-01 DIAGNOSIS — I723 Aneurysm of iliac artery: Secondary | ICD-10-CM | POA: Diagnosis not present

## 2023-01-01 DIAGNOSIS — I10 Essential (primary) hypertension: Secondary | ICD-10-CM

## 2023-01-01 DIAGNOSIS — I739 Peripheral vascular disease, unspecified: Secondary | ICD-10-CM

## 2023-01-01 LAB — CBC
HCT: 38.4 % — ABNORMAL LOW (ref 39.0–52.0)
Hemoglobin: 12.8 g/dL — ABNORMAL LOW (ref 13.0–17.0)
MCH: 30.3 pg (ref 26.0–34.0)
MCHC: 33.3 g/dL (ref 30.0–36.0)
MCV: 90.8 fL (ref 80.0–100.0)
Platelets: 175 10*3/uL (ref 150–400)
RBC: 4.23 MIL/uL (ref 4.22–5.81)
RDW: 12.7 % (ref 11.5–15.5)
WBC: 3.6 10*3/uL — ABNORMAL LOW (ref 4.0–10.5)
nRBC: 0 % (ref 0.0–0.2)

## 2023-01-01 LAB — BASIC METABOLIC PANEL
Anion gap: 7 (ref 5–15)
BUN: 11 mg/dL (ref 8–23)
CO2: 27 mmol/L (ref 22–32)
Calcium: 9.3 mg/dL (ref 8.9–10.3)
Chloride: 104 mmol/L (ref 98–111)
Creatinine, Ser: 0.86 mg/dL (ref 0.61–1.24)
GFR, Estimated: >60 mL/min (ref >60)
Glucose, Bld: 99 mg/dL (ref 70–99)
Potassium: 3.8 mmol/L (ref 3.5–5.1)
Sodium: 138 mmol/L (ref 135–145)

## 2023-01-01 LAB — VAS US ABI WITH/WO TBI
Left ABI: 0.98
Right ABI: 1.04

## 2023-01-01 NOTE — Progress Notes (Incomplete)
LAB;  Service: Cardiovascular;  Laterality: N/A;  . CARDIAC CATHETERIZATION N/A 08/09/2014   Procedure: Coronary Stent Intervention;  Surgeon: Iran Ouch, MD;  Location: [facility];  Service: Cardiovascular;  Laterality: N/A;  . CATARACT EXTRACTION W/ INTRAOCULAR LENS  IMPLANT, BILATERAL Bilateral 2023  . CORONARY ANGIOPLASTY  08/09/2014  . ENDARTERECTOMY FEMORAL Right 09/20/2022   Procedure: ENDARTERECTOMY FEMORAL;  Surgeon: Annice Needy, MD;  Location: [facility];  Service: Vascular;  Laterality: Right;  . HAND SURGERY Left 1980   bone chip with nerve damage  . INSERTION OF ILIAC STENT Right 09/20/2022   Procedure: INSERTION OF ILIAC STENT;  Surgeon: Annice Needy, MD;  Location: [facility];  Service: Vascular;  Laterality: Right;  . LEFT HEART CATH AND CORONARY ANGIOGRAPHY N/A 01/29/2018    Procedure: LEFT HEART CATH AND CORONARY ANGIOGRAPHY;  Surgeon: Yvonne Kendall, MD;  Location: [facility];  Service: Cardiovascular;  Laterality: N/A;  . LOWER EXTREMITY ANGIOGRAPHY Right 08/07/2022   Procedure: Lower Extremity Angiography;  Surgeon: Annice Needy, MD;  Location: [facility];  Service: Cardiovascular;  Laterality: Right;  . nerve reconstruction Left 1980   hand    Family History  Problem Relation Age of Onset  . CAD Maternal Grandfather     Allergies  Allergen Reactions  . Aspirin Anaphylaxis  . Penicillins Other (See Comments)    Childhood allergy per pt's mother. He does not recall any details of the reaction. Willing to try beta lactams.       Latest Ref Rng & Units 01/01/2023   12:07 PM 11/03/2022    4:21 PM 11/02/2022    4:48 AM  CBC  WBC 4.0 - 10.5 K/uL 3.6  4.8  5.5   Hemoglobin 13.0 - 17.0 g/dL 96.2  95.2  84.1   Hematocrit 39.0 - 52.0 % 38.4  36.4  33.2   Platelets 150 - 400 K/uL 175  160  175       CMP     Component Value Date/Time   NA 138 01/01/2023 1207   K 3.8 01/01/2023 1207   CL 104 01/01/2023 1207   CO2 27 01/01/2023 1207   GLUCOSE 99 01/01/2023 1207   BUN 11 01/01/2023 1207   CREATININE 0.86 01/01/2023 1207   CALCIUM 9.3 01/01/2023 1207   PROT 7.0 11/03/2022 1621   ALBUMIN 4.0 11/03/2022 1621   AST 33 11/03/2022 1621   ALT 24 11/03/2022 1621   ALKPHOS 100 11/03/2022 1621   BILITOT 0.9 11/03/2022 1621   GFRNONAA >60 01/01/2023 1207     VAS [place] ABI WITH/WO TBI  Result Date: 01/01/2023  LOWER EXTREMITY DOPPLER STUDY Patient Name:  Aaron Knox [employer]  Date of Exam:   01/01/2023 Medical Rec #: [id number]           Accession #:    [id number] Date of Birth: 07-Jul-1955           Patient Gender: M Patient Age:   67 years Exam Location:  [facility] Procedure:      VAS [place] ABI WITH/WO TBI Referring Phys: Sheppard Plumber --------------------------------------------------------------------------------  Indications:  Claudication, and peripheral artery disease. High Risk Factors: Hyperlipidemia, past history of smoking, prior MI, coronary                    artery disease.  Vascular Interventions: 09/20/2022 Right femoral endarterectomy and right common  LAB;  Service: Cardiovascular;  Laterality: N/A;  . CARDIAC CATHETERIZATION N/A 08/09/2014   Procedure: Coronary Stent Intervention;  Surgeon: Iran Ouch, MD;  Location: [facility];  Service: Cardiovascular;  Laterality: N/A;  . CATARACT EXTRACTION W/ INTRAOCULAR LENS  IMPLANT, BILATERAL Bilateral 2023  . CORONARY ANGIOPLASTY  08/09/2014  . ENDARTERECTOMY FEMORAL Right 09/20/2022   Procedure: ENDARTERECTOMY FEMORAL;  Surgeon: Annice Needy, MD;  Location: [facility];  Service: Vascular;  Laterality: Right;  . HAND SURGERY Left 1980   bone chip with nerve damage  . INSERTION OF ILIAC STENT Right 09/20/2022   Procedure: INSERTION OF ILIAC STENT;  Surgeon: Annice Needy, MD;  Location: [facility];  Service: Vascular;  Laterality: Right;  . LEFT HEART CATH AND CORONARY ANGIOGRAPHY N/A 01/29/2018    Procedure: LEFT HEART CATH AND CORONARY ANGIOGRAPHY;  Surgeon: Yvonne Kendall, MD;  Location: [facility];  Service: Cardiovascular;  Laterality: N/A;  . LOWER EXTREMITY ANGIOGRAPHY Right 08/07/2022   Procedure: Lower Extremity Angiography;  Surgeon: Annice Needy, MD;  Location: [facility];  Service: Cardiovascular;  Laterality: Right;  . nerve reconstruction Left 1980   hand    Family History  Problem Relation Age of Onset  . CAD Maternal Grandfather     Allergies  Allergen Reactions  . Aspirin Anaphylaxis  . Penicillins Other (See Comments)    Childhood allergy per pt's mother. He does not recall any details of the reaction. Willing to try beta lactams.       Latest Ref Rng & Units 01/01/2023   12:07 PM 11/03/2022    4:21 PM 11/02/2022    4:48 AM  CBC  WBC 4.0 - 10.5 K/uL 3.6  4.8  5.5   Hemoglobin 13.0 - 17.0 g/dL 96.2  95.2  84.1   Hematocrit 39.0 - 52.0 % 38.4  36.4  33.2   Platelets 150 - 400 K/uL 175  160  175       CMP     Component Value Date/Time   NA 138 01/01/2023 1207   K 3.8 01/01/2023 1207   CL 104 01/01/2023 1207   CO2 27 01/01/2023 1207   GLUCOSE 99 01/01/2023 1207   BUN 11 01/01/2023 1207   CREATININE 0.86 01/01/2023 1207   CALCIUM 9.3 01/01/2023 1207   PROT 7.0 11/03/2022 1621   ALBUMIN 4.0 11/03/2022 1621   AST 33 11/03/2022 1621   ALT 24 11/03/2022 1621   ALKPHOS 100 11/03/2022 1621   BILITOT 0.9 11/03/2022 1621   GFRNONAA >60 01/01/2023 1207     VAS [place] ABI WITH/WO TBI  Result Date: 01/01/2023  LOWER EXTREMITY DOPPLER STUDY Patient Name:  Aaron Knox [employer]  Date of Exam:   01/01/2023 Medical Rec #: [id number]           Accession #:    [id number] Date of Birth: 07-Jul-1955           Patient Gender: M Patient Age:   67 years Exam Location:  [facility] Procedure:      VAS [place] ABI WITH/WO TBI Referring Phys: Sheppard Plumber --------------------------------------------------------------------------------  Indications:  Claudication, and peripheral artery disease. High Risk Factors: Hyperlipidemia, past history of smoking, prior MI, coronary                    artery disease.  Vascular Interventions: 09/20/2022 Right femoral endarterectomy and right common  Subjective:    Patient ID: Aaron Knox, male    DOB: 1956-01-23, 67 y.o.   MRN: [id number] Chief Complaint  Patient presents with  . Follow-up    6 week ultrasound follow up    HPI  Review of Systems     Objective:   Physical Exam  BP (!) 162/75 (BP Location: Left Arm)   Pulse (!) 54   Resp 16   Wt 190 lb (86.2 kg)   BMI 24.39 kg/m   Past Medical History:  Diagnosis Date  . 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 CAD in native artery    a.) LHC/PCI 08/09/2014 (setting of STEMI): 30% mLAD, 99% pLCx (3.0 x 15 mm Resolute Integrity DES x 1 with 10% residual post-intervention stenosis), 45% mLCx, 20% pRCA, 40% dRCA); b.) LHC 01/29/2018: 30% mLAD, 45% mLCx, 50% pRCA, 80% dRCA --> med mgmt  . DDD (degenerative disc disease), lumbar   . Diastolic dysfunction    a.) TTE 08/11/2014 (s/p STEMI): EF 45-50%, inf HK, AoV sclerosis, G1DD; b.) TTE 02/28/2018: EF 55-60%, G1DD  . History of bilateral cataract extraction 2023  . HTN (hypertension)   . Hyperlipidemia   . Long term current use of antithrombotics/antiplatelets    a.) clopidogrel  . ST elevation myocardial infarction (STEMI) of true posterior wall (HCC) 08/09/2014   a.) LHC/PCI 08/09/2014: 3.0 x 15 mm Resolute Integrity DES x 1 pLCx with 10% residual post-intervention stenosis)  . Unstable angina [facility])     Social History   Socioeconomic History  . Marital status: Single    Spouse name: Not on file  . Number of children: 1  . Years of education: Not on file  . Highest education level: Not on file  Occupational History  . Not on file  Tobacco Use  . Smoking status: Former    Current packs/day: 0.00    Types: Cigarettes    Quit date: 08/2022    Years since quitting: 0.3  . Smokeless tobacco: Never  Vaping Use  . Vaping status: Never Used  Substance and Sexual Activity  . Alcohol use: Yes    Alcohol/week: 3.0 standard drinks of alcohol    Types: 3 Glasses of wine per week     Comment: weekends  . Drug use: No  . Sexual activity: Not on file  Other Topics Concern  . Not on file  Social History Narrative   Lives alone   Social Determinants of Health   Financial Resource Strain: Not on file  Food Insecurity: No Food Insecurity (11/01/2022)   Hunger Vital Sign   . Worried About Programme researcher, broadcasting/film/video in the Last Year: Never true   . Ran Out of Food in the Last Year: Never true  Transportation Needs: No Transportation Needs (11/01/2022)   PRAPARE - Transportation   . Lack of Transportation (Medical): No   . Lack of Transportation (Non-Medical): No  Physical Activity: Not on file  Stress: Not on file  Social Connections: Not on file  Intimate Partner Violence: Not At Risk (11/01/2022)   Humiliation, Afraid, Rape, and Kick questionnaire   . Fear of Current or Ex-Partner: No   . Emotionally Abused: No   . Physically Abused: No   . Sexually Abused: No    Past Surgical History:  Procedure Laterality Date  . CARDIAC CATHETERIZATION N/A 08/09/2014   Procedure: Left Heart Cath and Coronary Angiography;  Surgeon: Iran Ouch, MD;  Location: [facility] INVASIVE CV  LAB;  Service: Cardiovascular;  Laterality: N/A;  . CARDIAC CATHETERIZATION N/A 08/09/2014   Procedure: Coronary Stent Intervention;  Surgeon: Iran Ouch, MD;  Location: [facility];  Service: Cardiovascular;  Laterality: N/A;  . CATARACT EXTRACTION W/ INTRAOCULAR LENS  IMPLANT, BILATERAL Bilateral 2023  . CORONARY ANGIOPLASTY  08/09/2014  . ENDARTERECTOMY FEMORAL Right 09/20/2022   Procedure: ENDARTERECTOMY FEMORAL;  Surgeon: Annice Needy, MD;  Location: [facility];  Service: Vascular;  Laterality: Right;  . HAND SURGERY Left 1980   bone chip with nerve damage  . INSERTION OF ILIAC STENT Right 09/20/2022   Procedure: INSERTION OF ILIAC STENT;  Surgeon: Annice Needy, MD;  Location: [facility];  Service: Vascular;  Laterality: Right;  . LEFT HEART CATH AND CORONARY ANGIOGRAPHY N/A 01/29/2018    Procedure: LEFT HEART CATH AND CORONARY ANGIOGRAPHY;  Surgeon: Yvonne Kendall, MD;  Location: [facility];  Service: Cardiovascular;  Laterality: N/A;  . LOWER EXTREMITY ANGIOGRAPHY Right 08/07/2022   Procedure: Lower Extremity Angiography;  Surgeon: Annice Needy, MD;  Location: [facility];  Service: Cardiovascular;  Laterality: Right;  . nerve reconstruction Left 1980   hand    Family History  Problem Relation Age of Onset  . CAD Maternal Grandfather     Allergies  Allergen Reactions  . Aspirin Anaphylaxis  . Penicillins Other (See Comments)    Childhood allergy per pt's mother. He does not recall any details of the reaction. Willing to try beta lactams.       Latest Ref Rng & Units 01/01/2023   12:07 PM 11/03/2022    4:21 PM 11/02/2022    4:48 AM  CBC  WBC 4.0 - 10.5 K/uL 3.6  4.8  5.5   Hemoglobin 13.0 - 17.0 g/dL 96.2  95.2  84.1   Hematocrit 39.0 - 52.0 % 38.4  36.4  33.2   Platelets 150 - 400 K/uL 175  160  175       CMP     Component Value Date/Time   NA 138 01/01/2023 1207   K 3.8 01/01/2023 1207   CL 104 01/01/2023 1207   CO2 27 01/01/2023 1207   GLUCOSE 99 01/01/2023 1207   BUN 11 01/01/2023 1207   CREATININE 0.86 01/01/2023 1207   CALCIUM 9.3 01/01/2023 1207   PROT 7.0 11/03/2022 1621   ALBUMIN 4.0 11/03/2022 1621   AST 33 11/03/2022 1621   ALT 24 11/03/2022 1621   ALKPHOS 100 11/03/2022 1621   BILITOT 0.9 11/03/2022 1621   GFRNONAA >60 01/01/2023 1207     VAS [place] ABI WITH/WO TBI  Result Date: 01/01/2023  LOWER EXTREMITY DOPPLER STUDY Patient Name:  Aaron Knox [employer]  Date of Exam:   01/01/2023 Medical Rec #: [id number]           Accession #:    [id number] Date of Birth: 07-Jul-1955           Patient Gender: M Patient Age:   67 years Exam Location:  [facility] Procedure:      VAS [place] ABI WITH/WO TBI Referring Phys: Sheppard Plumber --------------------------------------------------------------------------------  Indications:  Claudication, and peripheral artery disease. High Risk Factors: Hyperlipidemia, past history of smoking, prior MI, coronary                    artery disease.  Vascular Interventions: 09/20/2022 Right femoral endarterectomy and right common

## 2023-01-01 NOTE — Progress Notes (Signed)
Subjective:    Patient ID: Aaron Knox, male    DOB: 02-07-1956, 67 y.o.   MRN: [id number] Chief Complaint  Patient presents with   Follow-up    6 week ultrasound follow up    The patient returns today for follow-up evaluation of his right femoral endarterectomy and right common iliac artery stent placement.  Unfortunately is continued to have a seroma in the area which is uncomfortable for him.  Today noninvasive studies indicate that the seroma has decreased in size somewhat from the previous studies in July but not significantly.  It measures 1.19 cm x 2.63 cm x 5.5 cm.  Additionally, the right iliac artery stent is patent today.  The patient's additional issue is regarding lower extremity edema.  He notes that some days his swelling is much improved but then he gets up and becomes activity has significant swelling in the right leg.  This is concerning and uncomfortable for him.  There not been the development of any open wounds or ulcerations.  He does note that his activity level is much improved since his intervention and he does not have any significant claudication-like symptoms.  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    Review of Systems  Cardiovascular:  Positive for leg swelling.  All other systems reviewed and are negative.      Objective:   Physical Exam Vitals reviewed.  HENT:     Head: Normocephalic.  Cardiovascular:     Rate and Rhythm: Normal rate.     Pulses:          Dorsalis pedis pulses are 1+ on the right side.       Posterior tibial pulses are 1+ on the right side.  Pulmonary:     Effort: Pulmonary effort is normal.  Musculoskeletal:     Right lower leg: Edema present.  Skin:    General: Skin is warm and dry.  Neurological:     Mental Status: He is alert and oriented to person, place, and time.  Psychiatric:        Mood and Affect: Mood normal.        Behavior: Behavior normal.         Thought Content: Thought content normal.        Judgment: Judgment normal.     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)    CAD in native artery    a.) LHC/PCI 08/09/2014 (setting of STEMI): 30% mLAD, 99% pLCx (3.0 x 15 mm Resolute Integrity DES x 1 with 10% residual post-intervention stenosis), 45% mLCx, 20% pRCA, 40% dRCA); b.) LHC 01/29/2018: 30% mLAD, 45% mLCx, 50% pRCA, 80% dRCA --> med mgmt   DDD (degenerative disc disease), lumbar    Diastolic dysfunction    a.) TTE 08/11/2014 (s/p STEMI): EF 45-50%, inf HK, AoV sclerosis, G1DD; b.) TTE 02/28/2018: EF 55-60%, G1DD   History of bilateral cataract extraction 2023   HTN (hypertension)    Hyperlipidemia    Long term current use of antithrombotics/antiplatelets    a.) clopidogrel   ST elevation myocardial infarction (STEMI) of true posterior wall (HCC) 08/09/2014   a.) LHC/PCI 08/09/2014: 3.0 x 15 mm Resolute Integrity DES x 1 pLCx with 10%  residual post-intervention stenosis)   Unstable angina (HCC)     Social History   Socioeconomic History   Marital status: Single    Spouse name: Not on file   Number of children: 1   Years of education: Not on file   Highest education level: Not on file  Occupational History   Not on file  Tobacco Use   Smoking status: Former    Current packs/day: 0.00    Types: Cigarettes    Quit date: 08/2022    Years since quitting: 0.3   Smokeless tobacco: Never  Vaping Use   Vaping status: Never Used  Substance and Sexual Activity   Alcohol use: Yes    Alcohol/week: 3.0 standard drinks of alcohol    Types: 3 Glasses of wine per week    Comment: weekends   Drug use: No   Sexual activity: Not on file  Other Topics Concern   Not on file  Social History Narrative   Lives alone   Social Determinants of Health   Financial  Resource Strain: Not on file  Food Insecurity: No Food Insecurity (11/01/2022)   Hunger Vital Sign    Worried About Running Out of Food in the Last Year: Never true    Ran Out of Food in the Last Year: Never true  Transportation Needs: No Transportation Needs (11/01/2022)   PRAPARE - Administrator, Civil Service (Medical): No    Lack of Transportation (Non-Medical): No  Physical Activity: Not on file  Stress: Not on file  Social Connections: Not on file  Intimate Partner Violence: Not At Risk (11/01/2022)   Humiliation, Afraid, Rape, and Kick questionnaire    Fear of Current or Ex-Partner: No    Emotionally Abused: No    Physically Abused: No    Sexually Abused: No    Past Surgical History:  Procedure Laterality Date   CARDIAC CATHETERIZATION N/A 08/09/2014   Procedure: Left Heart Cath and Coronary Angiography;  Surgeon: Iran Ouch, MD;  Location: [facility];  Service: Cardiovascular;  Laterality: N/A;   CARDIAC CATHETERIZATION N/A 08/09/2014   Procedure: Coronary Stent Intervention;  Surgeon: Iran Ouch, MD;  Location: [facility];  Service: Cardiovascular;  Laterality: N/A;   CATARACT EXTRACTION W/ INTRAOCULAR LENS  IMPLANT, BILATERAL Bilateral 2023   CORONARY ANGIOPLASTY  08/09/2014   ENDARTERECTOMY FEMORAL Right 09/20/2022   Procedure: ENDARTERECTOMY FEMORAL;  Surgeon: Annice Needy, MD;  Location: [facility];  Service: Vascular;  Laterality: Right;   HAND SURGERY Left 1980   bone chip with nerve damage   INSERTION OF ILIAC STENT Right 09/20/2022   Procedure: INSERTION OF ILIAC STENT;  Surgeon: Annice Needy, MD;  Location: [facility];  Service: Vascular;  Laterality: Right;   LEFT HEART CATH AND CORONARY ANGIOGRAPHY N/A 01/29/2018   Procedure: LEFT HEART CATH AND CORONARY ANGIOGRAPHY;  Surgeon: Yvonne Kendall, MD;  Location: [facility];  Service: Cardiovascular;  Laterality: N/A;   LOWER EXTREMITY ANGIOGRAPHY Right 08/07/2022    Procedure: Lower Extremity Angiography;  Surgeon: Annice Needy, MD;  Location: [facility];  Service: Cardiovascular;  Laterality: Right;   nerve reconstruction Left 1980   hand    Family History  Problem Relation Age of Onset   CAD Maternal Grandfather     Allergies  Allergen Reactions   Aspirin Anaphylaxis   Penicillins Other (See Comments)    Childhood allergy per pt's mother. He does not recall any details  of the reaction. Willing to try beta lactams.       Latest Ref Rng & Units 01/01/2023   12:07 PM 11/03/2022    4:21 PM 11/02/2022    4:48 AM  CBC  WBC 4.0 - 10.5 K/uL 3.6  4.8  5.5   Hemoglobin 13.0 - 17.0 g/dL 25.3  66.4  40.3   Hematocrit 39.0 - 52.0 % 38.4  36.4  33.2   Platelets 150 - 400 K/uL 175  160  175       CMP     Component Value Date/Time   NA 138 01/01/2023 1207   K 3.8 01/01/2023 1207   CL 104 01/01/2023 1207   CO2 27 01/01/2023 1207   GLUCOSE 99 01/01/2023 1207   BUN 11 01/01/2023 1207   CREATININE 0.86 01/01/2023 1207   CALCIUM 9.3 01/01/2023 1207   PROT 7.0 11/03/2022 1621   ALBUMIN 4.0 11/03/2022 1621   AST 33 11/03/2022 1621   ALT 24 11/03/2022 1621   ALKPHOS 100 11/03/2022 1621   BILITOT 0.9 11/03/2022 1621   GFRNONAA >60 01/01/2023 1207     VAS [place] ABI WITH/WO TBI  Result Date: 01/01/2023  LOWER EXTREMITY DOPPLER STUDY Patient Name:  Gilmore Tsuda [facility]  Date of Exam:   01/01/2023 Medical Rec #: [id number]           Accession #:    [id number] Date of Birth: [birth year]           Patient Gender: M Patient Age:   1 years Exam Location:  [facility] Procedure:      VAS [place] ABI WITH/WO TBI Referring Phys: Sheppard Plumber --------------------------------------------------------------------------------  Indications: Claudication, and peripheral artery disease. High Risk Factors: Hyperlipidemia, past history of smoking, prior MI, coronary                    artery disease.  Vascular Interventions: 09/20/2022 Right femoral  endarterectomy and right common                         iliac stent                          08/07/2022: Catheter placement into Right CIA from Left                         Femoral approach. Aortogram and Selective Right Lower                         Extremity Angiogram. Performing Technologist: Hardie Lora RVT  Examination Guidelines: A complete evaluation includes at minimum, Doppler waveform signals and systolic blood pressure reading at the level of bilateral brachial, anterior tibial, and posterior tibial arteries, when vessel segments are accessible. Bilateral testing is considered an integral part of a complete examination. Photoelectric Plethysmograph (PPG) waveforms and toe systolic pressure readings are included as required and additional duplex testing as needed. Limited examinations for reoccurring indications may be performed as noted.  ABI Findings: +---------+------------------+-----+---------+--------+ Right    Rt Pressure (mmHg)IndexWaveform Comment  +---------+------------------+-----+---------+--------+ Brachial 163                                      +---------+------------------+-----+---------+--------+ PTA      169  1.04 triphasic         +---------+------------------+-----+---------+--------+ DP       152               0.93 biphasic          +---------+------------------+-----+---------+--------+ Great Toe121               0.74                   +---------+------------------+-----+---------+--------+ +---------+------------------+-----+---------+-------+ Left     Lt Pressure (mmHg)IndexWaveform Comment +---------+------------------+-----+---------+-------+ Brachial 161                                     +---------+------------------+-----+---------+-------+ PTA      159               0.98 triphasic        +---------+------------------+-----+---------+-------+ DP       144               0.88 biphasic          +---------+------------------+-----+---------+-------+ Great Toe141               0.87                  +---------+------------------+-----+---------+-------+ +-------+-----------+-----------+------------+------------+ ABI/TBIToday's ABIToday's TBIPrevious ABIPrevious TBI +-------+-----------+-----------+------------+------------+ Right  1.04       0.74       0.94        0.89         +-------+-----------+-----------+------------+------------+ Left   0.98       0.87       0.93        0.82         +-------+-----------+-----------+------------+------------+ Bilateral ABIs appear essentially unchanged compared to prior study on 10/17/2022.  Summary: Right: Resting right ankle-brachial index is within normal range. The right toe-brachial index is normal. Left: Resting left ankle-brachial index is within normal range. The left toe-brachial index is normal. *See table(s) above for measurements and observations.  Electronically signed by Festus Barren MD on 01/01/2023 at 2:15:07 PM.    Final        Assessment & Plan:   1. Peripheral arterial disease with history of revascularization (HCC) I had the patient get blood work drawn and this was all within the normal range today.  Discussion with the patient and his significant other regarding next steps.  Will have the patient return venous reflux study in order to determine if he has any venous insufficiency that is making his swelling worse.  In the interim the patient is advised to utilize medical grade compression socks and elevate his lower extremities with use of conservative therapy for swelling.  At that time we will also have a discussion about the possibility of draining his seroma.   - CBC - BASIC METABOLIC PANEL WITH GFR  2. Iliac artery aneurysm, left (HCC) Today the patient has a left iliac artery aneurysm noted on ultrasound.  The largest diameter is 2.3 cm.  Typically risk for rupture below 2.5 is low however in accordance with  some of the patient's wishes we will consider fixing this once we resolve his issues from his previous intervention.  He will require a CT scan at that time.  3. Primary hypertension Continue antihypertensive medications as already ordered, these medications have been reviewed and there are no changes at this time.   Current Outpatient Medications on File  Prior to Visit  Medication Sig Dispense Refill   atorvastatin (LIPITOR) 80 MG tablet Take 1 tablet (80 mg total) by mouth daily at 6 PM. (Patient taking differently: Take 80 mg by mouth daily.) 90 tablet 3   clopidogrel (PLAVIX) 75 MG tablet Take 75 mg by mouth daily.     sulfamethoxazole-trimethoprim (BACTRIM DS) 800-160 MG tablet Take 1 tablet by mouth 2 (two) times daily. (Patient not taking: Reported on 11/14/2022) 14 tablet 0   No current facility-administered medications on file prior to visit.    There are no Patient Instructions on file for this visit. No follow-ups on file.   Georgiana Spinner, NP

## 2023-01-08 ENCOUNTER — Other Ambulatory Visit: Payer: Self-pay

## 2023-01-08 ENCOUNTER — Telehealth (INDEPENDENT_AMBULATORY_CARE_PROVIDER_SITE_OTHER): Payer: Self-pay | Admitting: Nurse Practitioner

## 2023-01-08 ENCOUNTER — Emergency Department
Admission: EM | Admit: 2023-01-08 | Discharge: 2023-01-08 | Disposition: A | Discharge status: Home / Self Care | Payer: Medicare PPO | Attending: Emergency Medicine | Admitting: Emergency Medicine

## 2023-01-08 ENCOUNTER — Other Ambulatory Visit (INDEPENDENT_AMBULATORY_CARE_PROVIDER_SITE_OTHER): Payer: Self-pay | Admitting: Nurse Practitioner

## 2023-01-08 ENCOUNTER — Emergency Department: Payer: Medicare PPO

## 2023-01-08 DIAGNOSIS — M7989 Other specified soft tissue disorders: Secondary | ICD-10-CM

## 2023-01-08 DIAGNOSIS — T792XXA Traumatic secondary and recurrent hemorrhage and seroma, initial encounter: Secondary | ICD-10-CM | POA: Insufficient documentation

## 2023-01-08 DIAGNOSIS — R1900 Intra-abdominal and pelvic swelling, mass and lump, unspecified site: Secondary | ICD-10-CM | POA: Diagnosis not present

## 2023-01-08 LAB — CBC
HCT: 38.9 % — ABNORMAL LOW (ref 39.0–52.0)
Hemoglobin: 13.2 g/dL (ref 13.0–17.0)
MCH: 30.5 pg (ref 26.0–34.0)
MCHC: 33.9 g/dL (ref 30.0–36.0)
MCV: 89.8 fL (ref 80.0–100.0)
Platelets: 203 10*3/uL (ref 150–400)
RBC: 4.33 MIL/uL (ref 4.22–5.81)
RDW: 12.8 % (ref 11.5–15.5)
WBC: 5.1 10*3/uL (ref 4.0–10.5)
nRBC: 0 % (ref 0.0–0.2)

## 2023-01-08 LAB — BASIC METABOLIC PANEL
Anion gap: 9 (ref 5–15)
BUN: 8 mg/dL (ref 8–23)
CO2: 27 mmol/L (ref 22–32)
Calcium: 9.4 mg/dL (ref 8.9–10.3)
Chloride: 103 mmol/L (ref 98–111)
Creatinine, Ser: 0.87 mg/dL (ref 0.61–1.24)
GFR, Estimated: >60 mL/min (ref >60)
Glucose, Bld: 122 mg/dL — ABNORMAL HIGH (ref 70–99)
Potassium: 3.3 mmol/L — ABNORMAL LOW (ref 3.5–5.1)
Sodium: 139 mmol/L (ref 135–145)

## 2023-01-08 NOTE — ED Provider Notes (Signed)
[facility] Provider Note    Event Date/Time   First MD Initiated Contact with Patient 01/08/23 2112     (approximate)   History   Groin Swelling   HPI Aaron Knox is a 67 y.o. male with prior right sided femoral endarterectomy, iliac stent presenting today for groin swelling.  Patient states ever since procedure on 09/20/2022 for endarterectomy of the right femoral with insertion of an iliac stent he has had intermittent groin swelling.  Previously seen in the emergency department on 7/24 and 7/26 for similar symptoms.  He did require admission initially for IV vancomycin and ultimately discharged on oral antibiotics.  He did report improvement in symptoms following oral antibiotic treatment.  The swelling has been there since but over the past day he reports that he notes it might of gotten larger.  He reports that his fiance thought he felt warm and he came to the emergency department for further evaluation.  Denies any significant new swelling, numbness or tingling to the right lower extremity, fevers, or chills.     Physical Exam   Triage Vital Signs: ED Triage Vitals  Encounter Vitals Group     BP 01/08/23 1611 (!) 164/78     Systolic BP Percentile --      Diastolic BP Percentile --      Pulse Rate 01/08/23 1611 87     Resp 01/08/23 1611 18     Temp 01/08/23 1611 97.8 F (36.6 C)     Temp Source 01/08/23 2107 Oral     SpO2 01/08/23 1611 99 %     Weight 01/08/23 1612 190 lb (86.2 kg)     Height 01/08/23 1612 6\' 2"  (1.88 m)     Head Circumference --      Peak Flow --      Pain Score 01/08/23 1612 2     Pain Loc --      Pain Education --      Exclude from Growth Chart --     Most recent vital signs: Vitals:   01/08/23 2107 01/08/23 2244  BP: (!) 143/73 136/88  Pulse: 63 64  Resp: 18 16  Temp: 97.6 F (36.4 C)   SpO2: 98% 96%   Physical Exam: I have reviewed the vital signs and nursing notes. General: Awake, alert, no acute  distress.  Nontoxic appearing. Head:  Atraumatic, normocephalic.   ENT:  EOM intact, PERRL. Oral mucosa is pink and moist with no lesions. Neck: Neck is supple with full range of motion, No meningeal signs. Cardiovascular:  RRR, No murmurs. Peripheral pulses palpable and equal bilaterally. Respiratory:  Symmetrical chest wall expansion.  No rhonchi, rales, or wheezes.  Good air movement throughout.  No use of accessory muscles.   Musculoskeletal:  No cyanosis or edema. Moving extremities with full ROM Abdomen:  Soft, nontender, nondistended. Neuro:  GCS 15, moving all four extremities, interacting appropriately. Speech clear. Psych:  Calm, appropriate.   Skin: Mild palpable fullness along the right groin region near the bottom of his incisional site.  No specific warmth or tenderness to palpation over the area.   ED Results / Procedures / Treatments   Labs (all labs ordered are listed, but only abnormal results are displayed) Labs Reviewed  CBC - Abnormal; Notable for the following components:      Result Value   HCT 38.9 (*)    All other components within normal limits  BASIC METABOLIC PANEL - Abnormal; Notable for the  following components:   Potassium 3.3 (*)    Glucose, Bld 122 (*)    All other components within normal limits     EKG    RADIOLOGY Arterial ultrasound Doppler of the right lower extremity shows small fluid collection measuring 5.8 x 2.6 x 1 cm.  No flow demonstrated through the lesion suggest pseudoaneurysm.  Likely hematoma versus postoperative collection.  Otherwise patent arteries seen on Doppler.  PROCEDURES:  Critical Care performed: No  Procedures   MEDICATIONS ORDERED IN ED: Medications - No data to display   IMPRESSION / MDM / ASSESSMENT AND PLAN / ED COURSE  I reviewed the triage vital signs and the nursing notes.                              Differential diagnosis includes, but is not limited to, postoperative seroma, hematoma, abscess,  cellulitis.  Patient's presentation is most consistent with exacerbation of chronic illness.  Patient is a 67 year old male presenting today for left groin swelling.  He does have a history of endarterectomy with iliac stent along with postoperative seroma following this.  At the site, very minimal swelling with no tenderness palpation, warmth, or erythema of the skin.  Arterial Doppler of the right lower extremity shows no evidence of pseudoaneurysm and does show seroma which appears consistent with prior size.  Discussed the case with Dr. Wyn Quaker with vascular surgery.  He believes this is just ongoing seroma and no emergent intervention needed otherwise.  No indication for antibiotics without signs of infection at the moment.  Patient safe for discharge with outpatient follow-up with vascular surgery.  Given strict return precautions and he is agreeable with this plan.  Clinical Course as of 01/08/23 2249  Mon Jan 08, 2023  2218 Spoke with Dr. Wyn Quaker with vascular surgery.  Reviewed images and thinks is just a seroma at this time.  No further intervention and patient safe for follow-up outpatient. [DW]    Clinical Course User Index [DW] Janith Lima, MD     FINAL CLINICAL IMPRESSION(S) / ED DIAGNOSES   Final diagnoses:  Seroma due to trauma [facility])     Rx / DC Orders   ED Discharge Orders     None        Note:  This document was prepared using Dragon voice recognition software and may include unintentional dictation errors.   Janith Lima, MD 01/08/23 2249

## 2023-01-08 NOTE — ED Triage Notes (Signed)
Pt to ED for right groin swelling x1 week. Denies fevers. Seen on 6/12 endarterectomy right femoral. Pulses felt. Leg warm, skin color WDL Discussed pt with Dr Anner Crete

## 2023-01-08 NOTE — ED Notes (Signed)
EDP at bedside  

## 2023-01-08 NOTE — Telephone Encounter (Signed)
Patient came into the office at approx 3:35 pm today and stated that he had left a message wanting to know if he should go to the ED. He states that he is having an excess of fluid in his groin area where he had surgery. After checking with Wylene Men, CMA, she did not have a message and the front office VM did not either. Due to not having a provider on site this afternoon, I advised to go to the ED if he felt it was necessary. I advised that I can't give medical advice so he would need to decide on his own if he needed to go to the ED. Patient acknowledged and stated that he would indeed go to [facility].

## 2023-01-08 NOTE — Discharge Instructions (Signed)
You were seen for the swelling in your right groin.  Imaging is reassuring today.  Spoke with your vascular surgeon who does not think you need antibiotics or other medications at this time.  You are safe for discharge and follow-up with them outpatient.

## 2023-01-11 ENCOUNTER — Encounter: Payer: Self-pay | Admitting: Emergency Medicine

## 2023-01-11 ENCOUNTER — Other Ambulatory Visit: Payer: Self-pay

## 2023-01-11 ENCOUNTER — Telehealth (INDEPENDENT_AMBULATORY_CARE_PROVIDER_SITE_OTHER): Payer: Self-pay

## 2023-01-11 DIAGNOSIS — Z955 Presence of coronary angioplasty implant and graft: Secondary | ICD-10-CM

## 2023-01-11 DIAGNOSIS — Y831 Surgical operation with implant of artificial internal device as the cause of abnormal reaction of the patient, or of later complication, without mention of misadventure at the time of the procedure: Secondary | ICD-10-CM | POA: Diagnosis present

## 2023-01-11 DIAGNOSIS — Z7902 Long term (current) use of antithrombotics/antiplatelets: Secondary | ICD-10-CM

## 2023-01-11 DIAGNOSIS — J449 Chronic obstructive pulmonary disease, unspecified: Secondary | ICD-10-CM | POA: Diagnosis present

## 2023-01-11 DIAGNOSIS — I70221 Atherosclerosis of native arteries of extremities with rest pain, right leg: Secondary | ICD-10-CM | POA: Diagnosis present

## 2023-01-11 DIAGNOSIS — Z87891 Personal history of nicotine dependence: Secondary | ICD-10-CM

## 2023-01-11 DIAGNOSIS — T82868A Thrombosis of vascular prosthetic devices, implants and grafts, initial encounter: Principal | ICD-10-CM | POA: Diagnosis present

## 2023-01-11 DIAGNOSIS — Z79899 Other long term (current) drug therapy: Secondary | ICD-10-CM

## 2023-01-11 DIAGNOSIS — Z8249 Family history of ischemic heart disease and other diseases of the circulatory system: Secondary | ICD-10-CM

## 2023-01-11 DIAGNOSIS — I739 Peripheral vascular disease, unspecified: Secondary | ICD-10-CM | POA: Diagnosis not present

## 2023-01-11 DIAGNOSIS — E785 Hyperlipidemia, unspecified: Secondary | ICD-10-CM | POA: Diagnosis present

## 2023-01-11 DIAGNOSIS — I252 Old myocardial infarction: Secondary | ICD-10-CM

## 2023-01-11 DIAGNOSIS — I2510 Atherosclerotic heart disease of native coronary artery without angina pectoris: Secondary | ICD-10-CM | POA: Diagnosis present

## 2023-01-11 DIAGNOSIS — E876 Hypokalemia: Secondary | ICD-10-CM | POA: Diagnosis present

## 2023-01-11 DIAGNOSIS — I10 Essential (primary) hypertension: Secondary | ICD-10-CM | POA: Diagnosis present

## 2023-01-11 DIAGNOSIS — Z880 Allergy status to penicillin: Secondary | ICD-10-CM

## 2023-01-11 DIAGNOSIS — Z886 Allergy status to analgesic agent status: Secondary | ICD-10-CM

## 2023-01-11 DIAGNOSIS — D6869 Other thrombophilia: Secondary | ICD-10-CM | POA: Diagnosis present

## 2023-01-11 DIAGNOSIS — Z7901 Long term (current) use of anticoagulants: Secondary | ICD-10-CM

## 2023-01-11 NOTE — ED Triage Notes (Signed)
Patient ambulatory to triage with steady gait, without difficulty or distress noted; pt reports having a rt iliac stent placed by Dr Wyn Quaker in June; f/u 2wks ago and scans were normal; st yesterday after mowing he again began having numbness and tingling to rt lower leg; denies pain or swelling

## 2023-01-11 NOTE — Telephone Encounter (Signed)
Aaron Knox left a voicemail stating he is having issues with his leg with some pain. Patient requested a call back. I left a voicemail to return call as soon as he could so I could get more details.

## 2023-01-11 NOTE — Telephone Encounter (Signed)
Can we find out how long the pain has been going on.  Ask him to describe it for you and if its worst at rest or with walking or activity.  Also the location of the pain as well.  Is he having worsening swelling symptoms.

## 2023-01-12 ENCOUNTER — Emergency Department: Payer: Medicare PPO

## 2023-01-12 ENCOUNTER — Other Ambulatory Visit (HOSPITAL_COMMUNITY): Payer: Self-pay

## 2023-01-12 ENCOUNTER — Inpatient Hospital Stay
Admission: EM | Admit: 2023-01-12 | Discharge: 2023-01-13 | DRG: 271 | Disposition: A | Discharge status: Home / Self Care | Payer: Medicare PPO | Attending: Internal Medicine | Admitting: Internal Medicine

## 2023-01-12 ENCOUNTER — Encounter
Admission: EM | Disposition: A | Discharge status: Home / Self Care | Payer: Self-pay | Source: Home / Self Care | Attending: Internal Medicine

## 2023-01-12 DIAGNOSIS — Z7901 Long term (current) use of anticoagulants: Secondary | ICD-10-CM | POA: Diagnosis not present

## 2023-01-12 DIAGNOSIS — I743 Embolism and thrombosis of arteries of the lower extremities: Secondary | ICD-10-CM

## 2023-01-12 DIAGNOSIS — I252 Old myocardial infarction: Secondary | ICD-10-CM | POA: Diagnosis not present

## 2023-01-12 DIAGNOSIS — T82599A Other mechanical complication of unspecified cardiac and vascular devices and implants, initial encounter: Secondary | ICD-10-CM | POA: Diagnosis present

## 2023-01-12 DIAGNOSIS — Z886 Allergy status to analgesic agent status: Secondary | ICD-10-CM | POA: Diagnosis not present

## 2023-01-12 DIAGNOSIS — I70221 Atherosclerosis of native arteries of extremities with rest pain, right leg: Secondary | ICD-10-CM

## 2023-01-12 DIAGNOSIS — I998 Other disorder of circulatory system: Secondary | ICD-10-CM | POA: Diagnosis present

## 2023-01-12 DIAGNOSIS — E876 Hypokalemia: Secondary | ICD-10-CM | POA: Diagnosis present

## 2023-01-12 DIAGNOSIS — J449 Chronic obstructive pulmonary disease, unspecified: Secondary | ICD-10-CM | POA: Diagnosis present

## 2023-01-12 DIAGNOSIS — I10 Essential (primary) hypertension: Secondary | ICD-10-CM | POA: Diagnosis present

## 2023-01-12 DIAGNOSIS — I2510 Atherosclerotic heart disease of native coronary artery without angina pectoris: Secondary | ICD-10-CM | POA: Diagnosis present

## 2023-01-12 DIAGNOSIS — Z955 Presence of coronary angioplasty implant and graft: Secondary | ICD-10-CM | POA: Diagnosis not present

## 2023-01-12 DIAGNOSIS — E785 Hyperlipidemia, unspecified: Secondary | ICD-10-CM | POA: Diagnosis present

## 2023-01-12 DIAGNOSIS — I70229 Atherosclerosis of native arteries of extremities with rest pain, unspecified extremity: Secondary | ICD-10-CM | POA: Diagnosis present

## 2023-01-12 DIAGNOSIS — I708 Atherosclerosis of other arteries: Secondary | ICD-10-CM

## 2023-01-12 DIAGNOSIS — Z79899 Other long term (current) drug therapy: Secondary | ICD-10-CM | POA: Diagnosis not present

## 2023-01-12 DIAGNOSIS — T82868A Thrombosis of vascular prosthetic devices, implants and grafts, initial encounter: Principal | ICD-10-CM

## 2023-01-12 DIAGNOSIS — Z8249 Family history of ischemic heart disease and other diseases of the circulatory system: Secondary | ICD-10-CM | POA: Diagnosis not present

## 2023-01-12 DIAGNOSIS — D6869 Other thrombophilia: Secondary | ICD-10-CM | POA: Diagnosis present

## 2023-01-12 DIAGNOSIS — Z7902 Long term (current) use of antithrombotics/antiplatelets: Secondary | ICD-10-CM | POA: Diagnosis not present

## 2023-01-12 DIAGNOSIS — Z87891 Personal history of nicotine dependence: Secondary | ICD-10-CM | POA: Diagnosis not present

## 2023-01-12 DIAGNOSIS — Z9889 Other specified postprocedural states: Secondary | ICD-10-CM | POA: Diagnosis not present

## 2023-01-12 DIAGNOSIS — I739 Peripheral vascular disease, unspecified: Principal | ICD-10-CM

## 2023-01-12 DIAGNOSIS — Z880 Allergy status to penicillin: Secondary | ICD-10-CM | POA: Diagnosis not present

## 2023-01-12 DIAGNOSIS — Y831 Surgical operation with implant of artificial internal device as the cause of abnormal reaction of the patient, or of later complication, without mention of misadventure at the time of the procedure: Secondary | ICD-10-CM | POA: Diagnosis present

## 2023-01-12 HISTORY — PX: LOWER EXTREMITY ANGIOGRAPHY: CATH118251

## 2023-01-12 LAB — CBC WITH DIFFERENTIAL/PLATELET
Abs Immature Granulocytes: 0.02 10*3/uL (ref 0.00–0.07)
Basophils Absolute: 0 10*3/uL (ref 0.0–0.1)
Basophils Relative: 1 %
Eosinophils Absolute: 0.2 10*3/uL (ref 0.0–0.5)
Eosinophils Relative: 3 %
HCT: 40.8 % (ref 39.0–52.0)
Hemoglobin: 13.6 g/dL (ref 13.0–17.0)
Immature Granulocytes: 0 %
Lymphocytes Relative: 21 %
Lymphs Abs: 1 10*3/uL (ref 0.7–4.0)
MCH: 30.6 pg (ref 26.0–34.0)
MCHC: 33.3 g/dL (ref 30.0–36.0)
MCV: 91.7 fL (ref 80.0–100.0)
Monocytes Absolute: 0.5 10*3/uL (ref 0.1–1.0)
Monocytes Relative: 10 %
Neutro Abs: 3.1 10*3/uL (ref 1.7–7.7)
Neutrophils Relative %: 65 %
Platelets: 164 10*3/uL (ref 150–400)
RBC: 4.45 MIL/uL (ref 4.22–5.81)
RDW: 13 % (ref 11.5–15.5)
WBC: 4.8 10*3/uL (ref 4.0–10.5)
nRBC: 0 % (ref 0.0–0.2)

## 2023-01-12 LAB — BASIC METABOLIC PANEL
Anion gap: 11 (ref 5–15)
BUN: 11 mg/dL (ref 8–23)
CO2: 25 mmol/L (ref 22–32)
Calcium: 9.1 mg/dL (ref 8.9–10.3)
Chloride: 103 mmol/L (ref 98–111)
Creatinine, Ser: 0.82 mg/dL (ref 0.61–1.24)
GFR, Estimated: >60 mL/min (ref >60)
Glucose, Bld: 103 mg/dL — ABNORMAL HIGH (ref 70–99)
Potassium: 3.1 mmol/L — ABNORMAL LOW (ref 3.5–5.1)
Sodium: 139 mmol/L (ref 135–145)

## 2023-01-12 LAB — HEPARIN LEVEL (UNFRACTIONATED): Heparin Unfractionated: 0.5 [IU]/mL (ref 0.30–0.70)

## 2023-01-12 LAB — APTT: aPTT: 31 s (ref 24–36)

## 2023-01-12 LAB — PROTIME-INR
INR: 1.2 (ref 0.8–1.2)
Prothrombin Time: 15 s (ref 11.4–15.2)

## 2023-01-12 LAB — MAGNESIUM: Magnesium: 2.2 mg/dL (ref 1.7–2.4)

## 2023-01-12 SURGERY — LOWER EXTREMITY ANGIOGRAPHY
Anesthesia: Moderate Sedation | Laterality: Right

## 2023-01-12 MED ORDER — ATORVASTATIN CALCIUM 20 MG PO TABS
80.0000 mg | ORAL_TABLET | Freq: Every day | ORAL | Status: DC
  Administered 2023-01-12: 80 mg via ORAL
  Filled 2023-01-12: qty 4

## 2023-01-12 MED ORDER — ALTEPLASE 2 MG IJ SOLR
INTRAMUSCULAR | Status: AC
  Filled 2023-01-12: qty 6

## 2023-01-12 MED ORDER — FENTANYL CITRATE PF 50 MCG/ML IJ SOSY
PREFILLED_SYRINGE | INTRAMUSCULAR | Status: AC
  Filled 2023-01-12: qty 1

## 2023-01-12 MED ORDER — IODIXANOL 320 MG/ML IV SOLN
INTRAVENOUS | Status: DC | PRN
  Administered 2023-01-12: 65 mL

## 2023-01-12 MED ORDER — CLOPIDOGREL BISULFATE 75 MG PO TABS: 75.0000 mg | ORAL_TABLET | Freq: Every day | ORAL | Status: DC

## 2023-01-12 MED ORDER — POTASSIUM CHLORIDE CRYS ER 20 MEQ PO TBCR
40.0000 meq | EXTENDED_RELEASE_TABLET | Freq: Once | ORAL | Status: AC
  Administered 2023-01-12: 40 meq via ORAL
  Filled 2023-01-12: qty 2

## 2023-01-12 MED ORDER — OXYCODONE HCL 5 MG PO TABS
5.0000 mg | ORAL_TABLET | ORAL | Status: DC | PRN
  Administered 2023-01-12: 5 mg via ORAL
  Filled 2023-01-12: qty 1

## 2023-01-12 MED ORDER — HEPARIN (PORCINE) 25000 UT/250ML-% IV SOLN
1400.0000 [IU]/h | INTRAVENOUS | Status: DC
  Administered 2023-01-12 – 2023-01-13 (×2): 1400 [IU]/h via INTRAVENOUS
  Filled 2023-01-12 (×2): qty 250

## 2023-01-12 MED ORDER — MIDAZOLAM HCL 5 MG/5ML IJ SOLN
INTRAMUSCULAR | Status: AC
  Filled 2023-01-12: qty 5

## 2023-01-12 MED ORDER — METHYLPREDNISOLONE SODIUM SUCC 125 MG IJ SOLR: 125.0000 mg | Freq: Once | INTRAMUSCULAR | Status: DC | PRN

## 2023-01-12 MED ORDER — HEPARIN SODIUM (PORCINE) 1000 UNIT/ML IJ SOLN
INTRAMUSCULAR | Status: DC | PRN
  Administered 2023-01-12: 5000 [IU] via INTRAVENOUS

## 2023-01-12 MED ORDER — MIDAZOLAM HCL 2 MG/2ML IJ SOLN
INTRAMUSCULAR | Status: DC | PRN
  Administered 2023-01-12 (×3): .5 mg via INTRAVENOUS
  Administered 2023-01-12: 2 mg via INTRAVENOUS
  Administered 2023-01-12: .5 mg via INTRAVENOUS

## 2023-01-12 MED ORDER — DIPHENHYDRAMINE HCL 50 MG/ML IJ SOLN: 50.0000 mg | Freq: Once | INTRAMUSCULAR | Status: DC | PRN

## 2023-01-12 MED ORDER — ACETAMINOPHEN 650 MG RE SUPP: 650.0000 mg | Freq: Four times a day (QID) | RECTAL | Status: DC | PRN

## 2023-01-12 MED ORDER — ONDANSETRON HCL 4 MG/2ML IJ SOLN: 4.0000 mg | Freq: Four times a day (QID) | INTRAMUSCULAR | Status: DC | PRN

## 2023-01-12 MED ORDER — HEPARIN BOLUS VIA INFUSION
5000.0000 [IU] | Freq: Once | INTRAVENOUS | Status: AC
  Administered 2023-01-12: 5000 [IU] via INTRAVENOUS
  Filled 2023-01-12: qty 5000

## 2023-01-12 MED ORDER — ONDANSETRON HCL 4 MG PO TABS: 4.0000 mg | ORAL_TABLET | Freq: Four times a day (QID) | ORAL | Status: DC | PRN

## 2023-01-12 MED ORDER — CLOPIDOGREL BISULFATE 75 MG PO TABS
75.0000 mg | ORAL_TABLET | Freq: Every day | ORAL | Status: DC
  Administered 2023-01-13: 75 mg via ORAL
  Filled 2023-01-12: qty 1

## 2023-01-12 MED ORDER — SODIUM CHLORIDE 0.9 % IV SOLN: INTRAVENOUS | Status: DC

## 2023-01-12 MED ORDER — HYDROMORPHONE HCL 1 MG/ML IJ SOLN: 1.0000 mg | Freq: Once | INTRAMUSCULAR | Status: DC | PRN

## 2023-01-12 MED ORDER — MIDAZOLAM HCL 2 MG/ML PO SYRP: 8.0000 mg | ORAL_SOLUTION | Freq: Once | ORAL | Status: DC | PRN

## 2023-01-12 MED ORDER — HEPARIN SODIUM (PORCINE) 1000 UNIT/ML IJ SOLN
INTRAMUSCULAR | Status: AC
  Filled 2023-01-12: qty 10

## 2023-01-12 MED ORDER — IOHEXOL 350 MG/ML SOLN
100.0000 mL | Freq: Once | INTRAVENOUS | Status: AC | PRN
  Administered 2023-01-12: 100 mL via INTRAVENOUS

## 2023-01-12 MED ORDER — FENTANYL CITRATE (PF) 100 MCG/2ML IJ SOLN
INTRAMUSCULAR | Status: DC | PRN
  Administered 2023-01-12: 50 ug via INTRAVENOUS
  Administered 2023-01-12 (×4): 25 ug via INTRAVENOUS

## 2023-01-12 MED ORDER — CEFAZOLIN SODIUM-DEXTROSE 2-4 GM/100ML-% IV SOLN
2.0000 g | INTRAVENOUS | Status: AC
  Administered 2023-01-12: 2 g via INTRAVENOUS

## 2023-01-12 MED ORDER — LIDOCAINE-EPINEPHRINE (PF) 1 %-1:200000 IJ SOLN
INTRAMUSCULAR | Status: DC | PRN
  Administered 2023-01-12: 10 mL

## 2023-01-12 MED ORDER — ALTEPLASE 1 MG/ML SYRINGE FOR VASCULAR PROCEDURE
INTRAMUSCULAR | Status: DC | PRN
  Administered 2023-01-12: 6 mg via INTRA_ARTERIAL

## 2023-01-12 MED ORDER — CEFAZOLIN SODIUM-DEXTROSE 2-4 GM/100ML-% IV SOLN
INTRAVENOUS | Status: AC
  Filled 2023-01-12: qty 100

## 2023-01-12 MED ORDER — FAMOTIDINE 20 MG PO TABS: 40.0000 mg | ORAL_TABLET | Freq: Once | ORAL | Status: DC | PRN

## 2023-01-12 MED ORDER — HEPARIN (PORCINE) IN NACL 1000-0.9 UT/500ML-% IV SOLN
INTRAVENOUS | Status: DC | PRN
  Administered 2023-01-12: 1000 mL

## 2023-01-12 MED ORDER — ACETAMINOPHEN 325 MG PO TABS: 650.0000 mg | ORAL_TABLET | Freq: Four times a day (QID) | ORAL | Status: DC | PRN

## 2023-01-12 MED ORDER — FENTANYL CITRATE PF 50 MCG/ML IJ SOSY
PREFILLED_SYRINGE | INTRAMUSCULAR | Status: AC
  Filled 2023-01-12: qty 2

## 2023-01-12 MED ORDER — FENTANYL CITRATE PF 50 MCG/ML IJ SOSY: 12.5000 ug | PREFILLED_SYRINGE | Freq: Once | INTRAMUSCULAR | Status: DC | PRN

## 2023-01-12 SURGICAL SUPPLY — 28 items
BALLN LUTONIX 7X80X130 (BALLOONS) ×1
BALLN LUTONIX AV 9X60X75 (BALLOONS) ×1
BALLOON LUTONIX 7X80X130 (BALLOONS) IMPLANT
BALLOON LUTONIX AV 9X60X75 (BALLOONS) IMPLANT
CANISTER PENUMBRA ENGINE (MISCELLANEOUS) IMPLANT
CATH ANGIO 5F PIGTAIL 65CM (CATHETERS) IMPLANT
CATH BEACON 5 .035 65 KMP TIP (CATHETERS) IMPLANT
CATH LIGHTNING BOLT 7 130 (CATHETERS) IMPLANT
CATH NAVICROSS ANGLED 90CM (MICROCATHETER) IMPLANT
CATH SEEKER .035X90 (CATHETERS) IMPLANT
CATH TEMPO 5F RIM 65CM (CATHETERS) IMPLANT
CATH VS15FR (CATHETERS) IMPLANT
COVER PROBE ULTRASOUND 5X96 (MISCELLANEOUS) IMPLANT
DEVICE STARCLOSE SE CLOSURE (Vascular Products) IMPLANT
GLIDEWIRE ADV .035X260CM (WIRE) IMPLANT
GLIDEWIRE ANGLED SS 035X260CM (WIRE) IMPLANT
INTRODUCER 7FR 23CM (INTRODUCER) IMPLANT
KIT ENCORE 26 ADVANTAGE (KITS) IMPLANT
PACK ANGIOGRAPHY (CUSTOM PROCEDURE TRAY) ×1 IMPLANT
SHEATH BRITE TIP 5FRX11 (SHEATH) IMPLANT
STENT VIABAHN 8X100X120 (Permanent Stent) ×1 IMPLANT
STENT VIABAHN 8X10X120 (Permanent Stent) IMPLANT
STENT VIABAHN 8X50X120 (Permanent Stent) IMPLANT
STENT VIABAHN5X120X8X (Permanent Stent) ×1 IMPLANT
SYR MEDRAD MARK 7 150ML (SYRINGE) IMPLANT
TUBING CONTRAST HIGH PRESS 72 (TUBING) IMPLANT
WIRE G V18X300CM (WIRE) IMPLANT
WIRE GUIDERIGHT .035X150 (WIRE) IMPLANT

## 2023-01-12 NOTE — ED Provider Notes (Addendum)
[facility] Provider Note    Event Date/Time   First MD Initiated Contact with Patient 01/12/23 0159     (approximate)   History   Numbness   HPI  Aaron Knox is a 67 y.o. male   Past medical history of CAD, PVD, hypertension hyperlipidemia, with femoral endarterectomy and iliac stenting performed by Dr. Wyn Quaker of vascular surgery this summer who presents with right sided lower extremity tingling sensation reminiscent of symptoms preceding his surgery this summer.  The tingling sensation is worse with ambulation.  He denies any trauma, falls.  He has no history of blood clots.  He has been compliant with his medications.  External Medical Documents Reviewed: Surgery note from vascular surgeon Dr. Wyn Quaker and June 2024 for femoral endarterectomy and insertion of iliac stent      Physical Exam   Triage Vital Signs: ED Triage Vitals  Encounter Vitals Group     BP 01/11/23 2342 (!) 156/74     Systolic BP Percentile --      Diastolic BP Percentile --      Pulse Rate 01/11/23 2342 (!) 43     Resp 01/11/23 2342 18     Temp 01/11/23 2342 98.1 F (36.7 C)     Temp src --      SpO2 01/11/23 2342 97 %     Weight 01/11/23 2342 189 lb 9.5 oz (86 kg)     Height 01/11/23 2342 6\' 2"  (1.88 m)     Head Circumference --      Peak Flow --      Pain Score 01/11/23 2342 0     Pain Loc --      Pain Education --      Exclude from Growth Chart --     Most recent vital signs: Vitals:   01/12/23 0258 01/12/23 0529  BP: (!) 145/71 138/73  Pulse: (!) 52 62  Resp:  18  Temp: 98 F (36.7 C) 98.1 F (36.7 C)  SpO2: 97% 97%    General: Awake, no distress.  CV:  Good peripheral perfusion.  Resp:  Normal effort.  Abd:  No distention.  Other:  Cool right lower extremity in the calf and foot, difficult to palpate distal pulses in the right lower extremity, motor intact, sensation intact, there is a small mass at the inguinal site that he states is chronic,  there is some mild tenderness to palpation of the calf and popliteal area as well as the thigh.   ED Results / Procedures / Treatments   Labs (all labs ordered are listed, but only abnormal results are displayed) Labs Reviewed  BASIC METABOLIC PANEL - Abnormal; Notable for the following components:      Result Value   Potassium 3.1 (*)    Glucose, Bld 103 (*)    All other components within normal limits  CBC WITH DIFFERENTIAL/PLATELET  APTT  PROTIME-INR     I ordered and reviewed the above labs they are notable for H&H, white blood cell count, creatinine within normal limit   RADIOLOGY I independently reviewed and interpreted CT angiogram bifemoral runoff and I do see contrast in the vessels in the distal portion of the right lower extremity I also reviewed radiologist's formal read.   PROCEDURES:  Critical Care performed: Yes, see critical care procedure note(s)  .Critical Care  Performed by: Pilar Jarvis, MD Authorized by: Pilar Jarvis, MD   Critical care provider statement:    Critical care time (  minutes):  30   Critical care was time spent personally by me on the following activities:  Development of treatment plan with patient or surrogate, discussions with consultants, evaluation of patient's response to treatment, examination of patient, ordering and review of laboratory studies, ordering and review of radiographic studies, ordering and performing treatments and interventions, pulse oximetry, re-evaluation of patient's condition and review of old charts    MEDICATIONS ORDERED IN ED: Medications  heparin bolus via infusion 5,000 Units (has no administration in time range)  heparin ADULT infusion 100 units/mL (25000 units/249mL) (has no administration in time range)  iohexol (OMNIPAQUE) 350 MG/ML injection 100 mL (100 mLs Intravenous Contrast Given 01/12/23 0247)  potassium chloride SA (KLOR-CON M) CR tablet 40 mEq (40 mEq Oral Given 01/12/23 0535)    External  physician / consultants:  I spoke with Dr Wyn Quaker of vascular regarding care plan for this patient.   IMPRESSION / MDM / ASSESSMENT AND PLAN / ED COURSE  I reviewed the triage vital signs and the nursing notes.                                Patient's presentation is most consistent with acute presentation with potential threat to life or bodily function.  Differential diagnosis includes, but is not limited to, claudication, restenosis, DVT, muscle spasms   The patient is on the cardiac monitor to evaluate for evidence of arrhythmia and/or significant heart rate changes.  MDM:    PVD endarterectomy vascular surgery performed in the summertime with recurrence of claudication symptoms concern for restenosis, ischemic limb, getting CT angiogram, will also obtain DVT ultrasound given pain to the thigh/calf with tenderness.  -- CTA w occlusion at distal portion of stent.  Dr Wyn Quaker of vascular consulted; advised NPO and heparin; admit for potential procedure today. Hospitalist paged for admit.       FINAL CLINICAL IMPRESSION(S) / ED DIAGNOSES   Final diagnoses:  Claudication (HCC)  Acute lower limb ischemia     Rx / DC Orders   ED Discharge Orders     None        Note:  This document was prepared using Dragon voice recognition software and may include unintentional dictation errors.    Pilar Jarvis, MD 01/12/23 [id number]    Pilar Jarvis, MD 01/12/23 [phone]

## 2023-01-12 NOTE — Progress Notes (Signed)
ANTICOAGULATION CONSULT NOTE  Pharmacy Consult for heparin infusion Indication: PVD  Allergies  Allergen Reactions   Aspirin Anaphylaxis   Penicillins Other (See Comments)    Childhood allergy per pt's mother. He does not recall any details of the reaction. Willing to try beta lactams.    Patient Measurements: Height: 6\' 2"  (188 cm) Weight: 86 kg (189 lb 9.5 oz) IBW/kg (Calculated) : 82.2 Heparin Dosing Weight: 86 kg  Vital Signs: Temp: 98.1 F (36.7 C) (10/04 0529) Temp Source: Oral (10/04 0529) BP: 138/73 (10/04 0529) Pulse Rate: 62 (10/04 0529)  Labs: Recent Labs    01/12/23 0213  HGB 13.6  HCT 40.8  PLT 164  CREATININE 0.82    Estimated Creatinine Clearance: 98.9 mL/min (by C-G formula based on SCr of 0.82 mg/dL).   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)    CAD in native artery    a.) LHC/PCI 08/09/2014 (setting of STEMI): 30% mLAD, 99% pLCx (3.0 x 15 mm Resolute Integrity DES x 1 with 10% residual post-intervention stenosis), 45% mLCx, 20% pRCA, 40% dRCA); b.) LHC 01/29/2018: 30% mLAD, 45% mLCx, 50% pRCA, 80% dRCA --> med mgmt   DDD (degenerative disc disease), lumbar    Diastolic dysfunction    a.) TTE 08/11/2014 (s/p STEMI): EF 45-50%, inf HK, AoV sclerosis, G1DD; b.) TTE 02/28/2018: EF 55-60%, G1DD   History of bilateral cataract extraction 2023   HTN (hypertension)    Hyperlipidemia    Long term current use of antithrombotics/antiplatelets    a.) clopidogrel   ST elevation myocardial infarction (STEMI) of true posterior wall (HCC) 08/09/2014   a.) LHC/PCI 08/09/2014: 3.0 x 15 mm Resolute Integrity DES x 1 pLCx with 10% residual post-intervention stenosis)   Unstable angina (HCC)     Assessment: Pt is a 67 yo male presenting to ED c/o "right sided lower extremity tingling sensation reminiscent of symptoms preceding his femoral endarterectomy and iliac stenting surgery"  this past summer.  Goal of Therapy:  Heparin level 0.3-0.7 units/ml Monitor platelets by anticoagulation protocol: Yes   Plan:  Bolus 5000 units x 1 Start heparin infusion at 1400 units/hr Will check HL in 6 hr after start of infusion CBC daily while on heparin  Otelia Sergeant, PharmD, [facility] 01/12/2023 5:53 AM

## 2023-01-12 NOTE — Telephone Encounter (Signed)
Patients phone is still going to voicemail. I looked at his chart he is currently at the ER.

## 2023-01-12 NOTE — TOC Benefit Eligibility Note (Signed)
Patient Product/process development scientist completed.    The patient is insured through [employer]. Patient has Medicare and is not eligible for a copay card, but may be able to apply for patient assistance, if available.    Ran test claim for Eliquis 5 mg and the current 30 day co-pay is $47.00.   This test claim was processed through [facility]- copay amounts may vary at other pharmacies due to pharmacy/plan contracts, or as the patient moves through the different stages of their insurance plan.     Roland Earl, CPHT Pharmacy Technician III Certified Patient Advocate [facility] Pharmacy Patient Advocate Team Direct Number: [phone]  Fax: [phone]

## 2023-01-12 NOTE — Plan of Care (Signed)
  Problem: Education: Goal: Knowledge of prescribed regimen will improve Outcome: Progressing   Problem: Activity: Goal: Ability to tolerate increased activity will improve Outcome: Progressing   Problem: Bowel/Gastric: Goal: Gastrointestinal status for postoperative course will improve Outcome: Progressing   Problem: Clinical Measurements: Goal: Postoperative complications will be avoided or minimized Outcome: Progressing 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 Outcome: Progressing   Problem: Skin Integrity: Goal: Demonstration of wound healing without infection will improve Outcome: Progressing   Problem: Education: Goal: Knowledge of General Education information will improve Description: Including pain rating scale, medication(s)/side effects and non-pharmacologic comfort measures Outcome: Progressing   Problem: Health Behavior/Discharge Planning: Goal: Ability to manage health-related needs will improve Outcome: Progressing   Problem: Clinical Measurements: Goal: Ability to maintain clinical measurements within normal limits will improve Outcome: Progressing Goal: Will remain free from infection Outcome: Progressing Goal: Diagnostic test results will improve Outcome: Progressing Goal: Respiratory complications will improve Outcome: Progressing Goal: Cardiovascular complication will be avoided Outcome: Progressing   Problem: Activity: Goal: Risk for activity intolerance will decrease Outcome: Progressing   Problem: Nutrition: Goal: Adequate nutrition will be maintained Outcome: Progressing   Problem: Coping: Goal: Level of anxiety will decrease Outcome: Progressing   Problem: Elimination: Goal: Will not experience complications related to bowel motility Outcome: Progressing Goal: Will not experience complications related to urinary retention Outcome: Progressing   Problem: Pain Managment: Goal: General experience of comfort will  improve Outcome: Progressing   Problem: Safety: Goal: Ability to remain free from injury will improve Outcome: Progressing   Problem: Skin Integrity: Goal: Risk for impaired skin integrity will decrease Outcome: Progressing   

## 2023-01-12 NOTE — Progress Notes (Signed)
ANTICOAGULATION CONSULT NOTE  Pharmacy Consult for heparin infusion Indication: PVD  Allergies  Allergen Reactions   Aspirin Anaphylaxis   Penicillins Other (See Comments)    Childhood allergy per pt's mother. He does not recall any details of the reaction. Willing to try beta lactams.    Patient Measurements: Height: 6\' 2"  (188 cm) Weight: 86 kg (189 lb 9.5 oz) IBW/kg (Calculated) : 82.2 Heparin Dosing Weight: 86 kg  Vital Signs: Temp: 97.9 F (36.6 C) (10/04 1946) Temp Source: Oral (10/04 1946) BP: 104/50 (10/04 1946) Pulse Rate: 43 (10/04 1946)  Labs: Recent Labs    01/12/23 0213 01/12/23 0635 01/12/23 2104  HGB 13.6  --   --   HCT 40.8  --   --   PLT 164  --   --   APTT  --  31  --   LABPROT  --  15.0  --   INR  --  1.2  --   HEPARINUNFRC  --   --  0.50  CREATININE 0.82  --   --     Estimated Creatinine Clearance: 98.9 mL/min (by C-G formula based on SCr of 0.82 mg/dL).   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)    CAD in native artery    a.) LHC/PCI 08/09/2014 (setting of STEMI): 30% mLAD, 99% pLCx (3.0 x 15 mm Resolute Integrity DES x 1 with 10% residual post-intervention stenosis), 45% mLCx, 20% pRCA, 40% dRCA); b.) LHC 01/29/2018: 30% mLAD, 45% mLCx, 50% pRCA, 80% dRCA --> med mgmt   DDD (degenerative disc disease), lumbar    Diastolic dysfunction    a.) TTE 08/11/2014 (s/p STEMI): EF 45-50%, inf HK, AoV sclerosis, G1DD; b.) TTE 02/28/2018: EF 55-60%, G1DD   History of bilateral cataract extraction 2023   HTN (hypertension)    Hyperlipidemia    Long term current use of antithrombotics/antiplatelets    a.) clopidogrel   ST elevation myocardial infarction (STEMI) of true posterior wall (HCC) 08/09/2014   a.) LHC/PCI 08/09/2014: 3.0 x 15 mm Resolute Integrity DES x 1 pLCx with 10% residual post-intervention stenosis)   Unstable angina (HCC)     Assessment: Pt is a 67 yo  male presenting to ED c/o "right sided lower extremity tingling sensation reminiscent of symptoms preceding his femoral endarterectomy and iliac stenting surgery" this past summer.  Goal of Therapy:  Heparin level 0.3-0.7 units/ml Monitor platelets by anticoagulation protocol: Yes   10/04 2104 HL 0.5, therapeutic x 1  Plan: Continue heparin infusion at 1400 units/hr Will check HL w/ AM labs to confirm CBC daily while on heparin  Otelia Sergeant, PharmD, [facility] 01/12/2023 10:13 PM

## 2023-01-12 NOTE — H&P (Addendum)
History and Physical    Aaron Knox YQI:[id number] DOB: 05/01/1955 DOA: 01/12/2023  PCP: Pcp, No (Confirm with patient/family/NH records and if not entered, this has to be entered at [facility] point of entry) Patient coming from: Home  I have personally briefly reviewed patient's old medical records in [facility]  Chief Complaint: Right calf pain  HPI: Aaron Knox is a 67 y.o. male with medical history significant of CAD, PAD status post right femoral endarterectomy and right iliac stent, COPD, presented with claudication of right leg.  Symptoms started 2 days ago, patient started to feel tingling sensation of the lower calf, worsening with activity, initially he thought that he might just sprained muscle but since yesterday symptoms has getting worse and he started to feel cramping-like pain of the whole calf when walking.  Patient underwent right femoral endarterectomy in June 2024 and since then patient has developed a seroma of the right groin area and came to ED 2 times but this time he denied any pain or swelling of the right groin area.  ED Course:  Afebrile, borderline bradycardia, blood pressure within normal limits.  CTA showed occlusion of the distal aspect of the stent just beyond the origin of the right internal iliac artery.  Blood work showed K3.1.  Vascular surgeon Dr. Wyn Quaker was consulted, who recommended heparin drip and n.p.o. for emergency arteriogram. Review of Systems: As per HPI otherwise 14 point review of systems negative.    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)    CAD in native artery    a.) LHC/PCI 08/09/2014 (setting of STEMI): 30% mLAD, 99% pLCx (3.0 x 15 mm Resolute Integrity DES x 1 with 10% residual post-intervention stenosis), 45% mLCx, 20% pRCA, 40% dRCA); b.) LHC 01/29/2018: 30% mLAD, 45% mLCx, 50% pRCA, 80% dRCA --> med mgmt   DDD (degenerative disc disease), lumbar    Diastolic  dysfunction    a.) TTE 08/11/2014 (s/p STEMI): EF 45-50%, inf HK, AoV sclerosis, G1DD; b.) TTE 02/28/2018: EF 55-60%, G1DD   History of bilateral cataract extraction 2023   HTN (hypertension)    Hyperlipidemia    Long term current use of antithrombotics/antiplatelets    a.) clopidogrel   ST elevation myocardial infarction (STEMI) of true posterior wall (HCC) 08/09/2014   a.) LHC/PCI 08/09/2014: 3.0 x 15 mm Resolute Integrity DES x 1 pLCx with 10% residual post-intervention stenosis)   Unstable angina [facility])     Past Surgical History:  Procedure Laterality Date   CARDIAC CATHETERIZATION N/A 08/09/2014   Procedure: Left Heart Cath and Coronary Angiography;  Surgeon: Iran Ouch, MD;  Location: [facility] INVASIVE CV LAB;  Service: Cardiovascular;  Laterality: N/A;   CARDIAC CATHETERIZATION N/A 08/09/2014   Procedure: Coronary Stent Intervention;  Surgeon: Iran Ouch, MD;  Location: [facility] INVASIVE CV LAB;  Service: Cardiovascular;  Laterality: N/A;   CATARACT EXTRACTION W/ INTRAOCULAR LENS  IMPLANT, BILATERAL Bilateral 2023   CORONARY ANGIOPLASTY  08/09/2014   ENDARTERECTOMY FEMORAL Right 09/20/2022   Procedure: ENDARTERECTOMY FEMORAL;  Surgeon: Annice Needy, MD;  Location: [facility] ORS;  Service: Vascular;  Laterality: Right;   HAND SURGERY Left 1980   bone chip with nerve damage   INSERTION OF ILIAC STENT Right 09/20/2022   Procedure: INSERTION OF ILIAC STENT;  Surgeon: Annice Needy, MD;  Location: [facility] ORS;  Service: Vascular;  Laterality: Right;   LEFT HEART CATH AND CORONARY ANGIOGRAPHY N/A 01/29/2018  Procedure: LEFT HEART CATH AND CORONARY ANGIOGRAPHY;  Surgeon: Yvonne Kendall, MD;  Location: [facility] INVASIVE CV LAB;  Service: Cardiovascular;  Laterality: N/A;   LOWER EXTREMITY ANGIOGRAPHY Right 08/07/2022   Procedure: Lower Extremity Angiography;  Surgeon: Annice Needy, MD;  Location: [facility] INVASIVE CV LAB;  Service: Cardiovascular;  Laterality: Right;   nerve reconstruction Left 1980    hand     reports that he quit smoking about 5 months ago. His smoking use included cigarettes. He has never used smokeless tobacco. He reports current alcohol use of about 3.0 standard drinks of alcohol per week. He reports that he does not use drugs.  Allergies  Allergen Reactions   Aspirin Anaphylaxis   Penicillins Other (See Comments)    Childhood allergy per pt's mother. He does not recall any details of the reaction. Willing to try beta lactams.    Family History  Problem Relation Age of Onset   CAD Maternal Grandfather      Prior to Admission medications   Medication Sig Start Date End Date Taking? Authorizing Provider  atorvastatin (LIPITOR) 80 MG tablet Take 1 tablet (80 mg total) by mouth daily at 6 PM. Patient taking differently: Take 80 mg by mouth daily. 08/22/22   Furth, Cadence H, PA-C  clopidogrel (PLAVIX) 75 MG tablet Take 75 mg by mouth daily. 08/23/22   [provider]  sulfamethoxazole-trimethoprim (BACTRIM DS) 800-160 MG tablet Take 1 tablet by mouth 2 (two) times daily. Patient not taking: Reported on 11/14/2022 11/03/22   Sharman Cheek, MD    Physical Exam: Vitals:   01/11/23 2342 01/11/23 2342 01/12/23 0258 01/12/23 0529  BP:  (!) 156/74 (!) 145/71 138/73  Pulse:  (!) 43 (!) 52 62  Resp:  18  18  Temp:  98.1 F (36.7 C) 98 F (36.7 C) 98.1 F (36.7 C)  TempSrc:   Oral Oral  SpO2:  97% 97% 97%  Weight: 86 kg     Height: 6\' 2"  (1.88 m)       Constitutional: NAD, calm, comfortable Vitals:   01/11/23 2342 01/11/23 2342 01/12/23 0258 01/12/23 0529  BP:  (!) 156/74 (!) 145/71 138/73  Pulse:  (!) 43 (!) 52 62  Resp:  18  18  Temp:  98.1 F (36.7 C) 98 F (36.7 C) 98.1 F (36.7 C)  TempSrc:   Oral Oral  SpO2:  97% 97% 97%  Weight: 86 kg     Height: 6\' 2"  (1.88 m)      Eyes: PERRL, lids and conjunctivae normal ENMT: Mucous membranes are moist. Posterior pharynx clear of any exudate or lesions.Normal dentition.  Neck: normal, supple, no  masses, no thyromegaly Respiratory: clear to auscultation bilaterally, no wheezing, no crackles. Normal respiratory effort. No accessory muscle use.  Cardiovascular: Regular rate and rhythm, no murmurs / rubs / gallops. No extremity edema.  Diminished pedal pulses bilaterally. No carotid bruits.  Abdomen: no tenderness, no masses palpated. No hepatosplenomegaly. Bowel sounds positive.  Musculoskeletal: no clubbing / cyanosis. No joint deformity upper and lower extremities. Good ROM, no contractures. Normal muscle tone.  Skin: no rashes, lesions, ulcers. No induration Neurologic: CN 2-12 grossly intact. Sensation intact, DTR normal. Strength 5/5 in all 4.  Psychiatric: Normal judgment and insight. Alert and oriented x 3. Normal mood.     Labs on Admission: I have personally reviewed following labs and imaging studies  CBC: Recent Labs  Lab 01/08/23 1613 01/12/23 0213  WBC 5.1 4.8  NEUTROABS  --  3.1  HGB 13.2 13.6  HCT 38.9* 40.8  MCV 89.8 91.7  PLT 203 164   Basic Metabolic Panel: Recent Labs  Lab 01/08/23 1613 01/12/23 0213  NA 139 139  K 3.3* 3.1*  CL 103 103  CO2 27 25  GLUCOSE 122* 103*  BUN 8 11  CREATININE 0.87 0.82  CALCIUM 9.4 9.1   GFR: Estimated Creatinine Clearance: 98.9 mL/min (by C-G formula based on SCr of 0.82 mg/dL). Liver Function Tests: No results for input(s): "AST", "ALT", "ALKPHOS", "BILITOT", "PROT", "ALBUMIN" in the last 168 hours. No results for input(s): "LIPASE", "AMYLASE" in the last 168 hours. No results for input(s): "AMMONIA" in the last 168 hours. Coagulation Profile: Recent Labs  Lab 01/12/23 0635  INR 1.2   Cardiac Enzymes: No results for input(s): "CKTOTAL", "CKMB", "CKMBINDEX", "TROPONINI" in the last 168 hours. BNP (last 3 results) No results for input(s): "PROBNP" in the last 8760 hours. HbA1C: No results for input(s): "HGBA1C" in the last 72 hours. CBG: No results for input(s): "GLUCAP" in the last 168 hours. Lipid  Profile: No results for input(s): "CHOL", "HDL", "LDLCALC", "TRIG", "CHOLHDL", "LDLDIRECT" in the last 72 hours. Thyroid Function Tests: No results for input(s): "TSH", "T4TOTAL", "FREET4", "T3FREE", "THYROIDAB" in the last 72 hours. Anemia Panel: No results for input(s): "VITAMINB12", "FOLATE", "FERRITIN", "TIBC", "IRON", "RETICCTPCT" in the last 72 hours. Urine analysis:    Component Value Date/Time   COLORURINE STRAW (A) 10/31/2022 2230   APPEARANCEUR CLEAR (A) 10/31/2022 2230   LABSPEC 1.015 10/31/2022 2230   PHURINE 6.0 10/31/2022 2230   GLUCOSEU NEGATIVE 10/31/2022 2230   HGBUR NEGATIVE 10/31/2022 2230   BILIRUBINUR NEGATIVE 10/31/2022 2230   KETONESUR NEGATIVE 10/31/2022 2230   PROTEINUR NEGATIVE 10/31/2022 2230   NITRITE NEGATIVE 10/31/2022 2230   LEUKOCYTESUR NEGATIVE 10/31/2022 2230    Radiological Exams on Admission: US Venous Img Lower Unilateral Right  Result Date: 01/12/2023 CLINICAL DATA:  Right calf pain and swelling. EXAM: RIGHT LOWER EXTREMITY VENOUS DOPPLER ULTRASOUND TECHNIQUE: Gray-scale sonography with compression, as well as color and duplex ultrasound, were performed to evaluate the deep venous system(s) from the level of the common femoral vein through the popliteal and proximal calf veins. COMPARISON:  The similar study recently 01/08/2023 FINDINGS: VENOUS Normal compressibility of the common femoral, superficial femoral, and popliteal veins, as well as the visualized calf veins. Visualized portions of profunda femoral vein and great saphenous vein unremarkable. No filling defects to suggest DVT on grayscale or color Doppler imaging. Doppler waveforms show normal direction of venous flow, normal respiratory plasticity and response to augmentation. Limited views of the contralateral common femoral vein are unremarkable. OTHER Still present and not significantly changed, anterior to the proximal superficial femoral vein there is an elongate complex cystic lesion with  no internal color flow, measuring 5.2 x 0.7 x 2.2 cm. Limitations: none IMPRESSION: 1. No evidence of deep venous thrombosis in the right lower extremity. 2. Unchanged complex cystic lesion anterior to the proximal superficial femoral vein, measuring 5.2 x 0.7 x 2.2 cm. Electronically Signed   By: Almira Bar M.D.   On: 01/12/2023 04:51   CT ANGIO AO+BIFEM W & OR WO CONTRAST  Result Date: 01/12/2023 CLINICAL DATA:  Right lower extremity numbness, history of prior iliac stent placement, initial encounter EXAM: CT ANGIOGRAPHY OF ABDOMINAL AORTA WITH ILIOFEMORAL RUNOFF TECHNIQUE: Multidetector CT imaging of the abdomen, pelvis and lower extremities was performed using the standard protocol during bolus administration of intravenous contrast. Multiplanar CT image reconstructions and  MIPs were obtained to evaluate the vascular anatomy. RADIATION DOSE REDUCTION: This exam was performed according to the departmental dose-optimization program which includes automated exposure control, adjustment of the mA and/or kV according to patient size and/or use of iterative reconstruction technique. CONTRAST:  OMNIPAQUE IOHEXOL 350 MG/ML SOLN COMPARISON:  None Available. FINDINGS: VASCULAR Aorta: Abdominal aorta demonstrates atherosclerotic calcification without aneurysmal dilatation or dissection. Celiac: Patent without evidence of aneurysm, dissection, vasculitis or significant stenosis. SMA: Patent without evidence of aneurysm, dissection, vasculitis or significant stenosis. Renals: Both renal arteries are patent without evidence of aneurysm, dissection, vasculitis, fibromuscular dysplasia or significant stenosis. Dual renal arteries are noted bilaterally. IMA: Patent without evidence of aneurysm, dissection, vasculitis or significant stenosis. RIGHT Lower Extremity Inflow: Common iliac artery stent is noted on the right. It appears widely patent. Just beyond the origin of the right internal iliac artery however the  stent shows diffuse occlusion to the level of the common femoral artery. Runoff: Reconstitution of the superficial femoral artery is noted. Scattered atherosclerotic calcifications are seen. The popliteal artery on the right is within normal limits. Popliteal trifurcation is patent with three-vessel runoff to the right ankle. LEFT Lower Extremity Inflow: Atherosclerotic calcifications are noted in the left iliac system without focal stenosis. Common femoral artery is within normal limits. Runoff: Femoral bifurcation is patent. Superficial femoral arteries demonstrates scattered atherosclerotic calcifications. Popliteal artery is patent with a normal patent trifurcation. The anterior tibial artery is occluded beyond its origin. Peroneal and posterior tibial arteries continue to the level of the left ankle. Some collateral flow reconstitutes the dorsalis pedis artery in the left foot. Veins: No specific venous abnormality is noted. Review of the MIP images confirms the above findings. NON-VASCULAR Lower chest: No acute abnormality. Hepatobiliary: No focal liver abnormality is seen. No gallstones, gallbladder wall thickening, or biliary dilatation. Pancreas: Unremarkable. No pancreatic ductal dilatation or surrounding inflammatory changes. Spleen: Normal in size without focal abnormality. Adrenals/Urinary Tract: Adrenal glands are within normal limits. Kidneys are well visualized bilaterally. No renal calculi or obstructive changes are seen. Simple cysts are noted within the right kidney. No follow-up is recommended. No obstructive changes are seen. The bladder is well distended. Stomach/Bowel: No obstructive or inflammatory changes of the colon are noted. The appendix is within normal limits. Small bowel and stomach are unremarkable. Lymphatic: No specific lymphadenopathy is noted. Reproductive: Prostate is unremarkable. Other: No free fluid is noted. Musculoskeletal: No acute or significant osseous findings.  IMPRESSION: VASCULAR Right iliac arterial stents are seen. There is occlusion of the distal aspect of the stent just beyond the origin of the right internal iliac artery. Three-vessel runoff to the right ankle. Two vessel runoff to the left ankle. NON-VASCULAR No acute abnormality is noted to correspond with the given clinical history. Electronically Signed   By: Alcide Clever M.D.   On: 01/12/2023 03:44    EKG: None  Assessment/Plan Principal Problem:   Occlusion of stent of peripheral artery, initial encounter Active Problems:   Atherosclerosis of artery of extremity with rest pain (HCC)   Limb ischemia  (please populate well all problems here in Problem List. (For example, if patient is on BP meds at home and you resume or decide to hold them, it is a problem that needs to be her. Same for CAD, COPD, HLD and so on)  Acute claudication Acute occlusion of origin of right internal iliac artery and limb ischemia of right leg -Continue heparin drip -Emergency arteriogram by vascular surgeon this morning -Symptomatic  management, alternate Tylenol and oxycodone for pain -Continue high potency statin  Hypokalemia -Will replacement, recheck level tomorrow  HTN -Controlled  COPD -Stable, continue as needed breathing meds  CAD -No acute concern, skip today's plavix for incoming vascular procedure   DVT prophylaxis: Heparin drip Code Status: Full code Family Communication: None at bedside Disposition Plan: Patient is sick with acute limb ischemia requiring inpatient vascular surgery procedure, expect more than 2 midnight hospital Consults called: Vascular surgery Admission status:MedSurg admit   Emeline General MD [facility] Pager [phone]  01/12/2023, 7:33 AM

## 2023-01-12 NOTE — Consult Note (Signed)
Hospital Consult    Reason for Consult:  Right Lower Extremity Ischemia.  Requesting Physician:  Dr. Mikey College MD MRN #:  [id number]  History of Present Illness: This is a 67 y.o. male who presents to [facility] emergency department with right groin pain.  Patient underwent an endarterectomy on the right side with iliac stent placement back on 09/20/2022 with Dr. Festus Barren MD.  Patient endorses he was doing great up until 3 days ago when he had extreme pain to his right groin.  He came to the emergency department and had an ultrasound done.  He was discharged home without any known pseudoaneurysm.  He returns today with increased pain to his right lower extremity in his thigh and groin region.  Vascular surgery was consulted to evaluate.  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)    CAD in native artery    a.) LHC/PCI 08/09/2014 (setting of STEMI): 30% mLAD, 99% pLCx (3.0 x 15 mm Resolute Integrity DES x 1 with 10% residual post-intervention stenosis), 45% mLCx, 20% pRCA, 40% dRCA); b.) LHC 01/29/2018: 30% mLAD, 45% mLCx, 50% pRCA, 80% dRCA --> med mgmt   DDD (degenerative disc disease), lumbar    Diastolic dysfunction    a.) TTE 08/11/2014 (s/p STEMI): EF 45-50%, inf HK, AoV sclerosis, G1DD; b.) TTE 02/28/2018: EF 55-60%, G1DD   History of bilateral cataract extraction 2023   HTN (hypertension)    Hyperlipidemia    Long term current use of antithrombotics/antiplatelets    a.) clopidogrel   ST elevation myocardial infarction (STEMI) of true posterior wall (HCC) 08/09/2014   a.) LHC/PCI 08/09/2014: 3.0 x 15 mm Resolute Integrity DES x 1 pLCx with 10% residual post-intervention stenosis)   Unstable angina [facility])     Past Surgical History:  Procedure Laterality Date   CARDIAC CATHETERIZATION N/A 08/09/2014   Procedure: Left Heart Cath and Coronary Angiography;  Surgeon: Iran Ouch, MD;  Location: [facility] INVASIVE CV LAB;  Service:  Cardiovascular;  Laterality: N/A;   CARDIAC CATHETERIZATION N/A 08/09/2014   Procedure: Coronary Stent Intervention;  Surgeon: Iran Ouch, MD;  Location: [facility] INVASIVE CV LAB;  Service: Cardiovascular;  Laterality: N/A;   CATARACT EXTRACTION W/ INTRAOCULAR LENS  IMPLANT, BILATERAL Bilateral 2023   CORONARY ANGIOPLASTY  08/09/2014   ENDARTERECTOMY FEMORAL Right 09/20/2022   Procedure: ENDARTERECTOMY FEMORAL;  Surgeon: Annice Needy, MD;  Location: [facility] ORS;  Service: Vascular;  Laterality: Right;   HAND SURGERY Left 1980   bone chip with nerve damage   INSERTION OF ILIAC STENT Right 09/20/2022   Procedure: INSERTION OF ILIAC STENT;  Surgeon: Annice Needy, MD;  Location: [facility] ORS;  Service: Vascular;  Laterality: Right;   LEFT HEART CATH AND CORONARY ANGIOGRAPHY N/A 01/29/2018   Procedure: LEFT HEART CATH AND CORONARY ANGIOGRAPHY;  Surgeon: Yvonne Kendall, MD;  Location: [facility] INVASIVE CV LAB;  Service: Cardiovascular;  Laterality: N/A;   LOWER EXTREMITY ANGIOGRAPHY Right 08/07/2022   Procedure: Lower Extremity Angiography;  Surgeon: Annice Needy, MD;  Location: [facility] INVASIVE CV LAB;  Service: Cardiovascular;  Laterality: Right;   nerve reconstruction Left 1980   hand    Allergies  Allergen Reactions   Aspirin Anaphylaxis   Penicillins Other (See Comments)    Childhood allergy per pt's mother. He does not recall any details of the reaction. Willing to try beta lactams.    Prior to Admission medications   Medication Sig Start Date End  Date Taking? Authorizing Provider  atorvastatin (LIPITOR) 80 MG tablet Take 1 tablet (80 mg total) by mouth daily at 6 PM. Patient taking differently: Take 80 mg by mouth daily. 08/22/22   Furth, Cadence H, PA-C  clopidogrel (PLAVIX) 75 MG tablet Take 75 mg by mouth daily. 08/23/22   [provider]  sulfamethoxazole-trimethoprim (BACTRIM DS) 800-160 MG tablet Take 1 tablet by mouth 2 (two) times daily. Patient not taking: Reported on 11/14/2022  11/03/22   Sharman Cheek, MD    Social History   Socioeconomic History   Marital status: Single    Spouse name: Not on file   Number of children: 1   Years of education: Not on file   Highest education level: Not on file  Occupational History   Not on file  Tobacco Use   Smoking status: Former    Current packs/day: 0.00    Types: Cigarettes    Quit date: 08/2022    Years since quitting: 0.4   Smokeless tobacco: Never  Vaping Use   Vaping status: Never Used  Substance and Sexual Activity   Alcohol use: Yes    Alcohol/week: 3.0 standard drinks of alcohol    Types: 3 Glasses of wine per week    Comment: weekends   Drug use: No   Sexual activity: Not on file  Other Topics Concern   Not on file  Social History Narrative   Lives alone   Social Determinants of Health   Financial Resource Strain: Not on file  Food Insecurity: No Food Insecurity (11/01/2022)   Hunger Vital Sign    Worried About Running Out of Food in the Last Year: Never true    Ran Out of Food in the Last Year: Never true  Transportation Needs: No Transportation Needs (11/01/2022)   PRAPARE - Administrator, Civil Service (Medical): No    Lack of Transportation (Non-Medical): No  Physical Activity: Not on file  Stress: Not on file  Social Connections: Not on file  Intimate Partner Violence: Not At Risk (11/01/2022)   Humiliation, Afraid, Rape, and Kick questionnaire    Fear of Current or Ex-Partner: No    Emotionally Abused: No    Physically Abused: No    Sexually Abused: No     Family History  Problem Relation Age of Onset   CAD Maternal Grandfather     ROS: Otherwise negative unless mentioned in HPI  Physical Examination  Vitals:   01/12/23 0258 01/12/23 0529  BP: (!) 145/71 138/73  Pulse: (!) 52 62  Resp:  18  Temp: 98 F (36.7 C) 98.1 F (36.7 C)  SpO2: 97% 97%   Body mass index is 24.34 kg/m.  General:  WDWN in NAD Gait: Not observed HENT: WNL,  normocephalic Pulmonary: normal non-labored breathing, without Rales, rhonchi,  wheezing Cardiac: regular, without  Murmurs, rubs or gallops; without carotid bruits Abdomen: Positive bowel sounds throughout, soft, NT/ND, no masses Skin: without rashes Vascular Exam/Pulses: Palpable pulses in DP/PT on the left.  Monophasic Doppler pulses in DP/PT on right.  Both lower extremities cool to touch. Extremities: with ischemic changes, without Gangrene , without cellulitis; without open wounds;  Musculoskeletal: no muscle wasting or atrophy  Neurologic: A&O X 3;  No focal weakness or paresthesias are detected; speech is fluent/normal Psychiatric:  The pt has Normal affect. Lymph:  Unremarkable  CBC    Component Value Date/Time   WBC 4.8 01/12/2023 0213   RBC 4.45 01/12/2023 0213   HGB  13.6 01/12/2023 0213   HCT 40.8 01/12/2023 0213   PLT 164 01/12/2023 0213   MCV 91.7 01/12/2023 0213   MCH 30.6 01/12/2023 0213   MCHC 33.3 01/12/2023 0213   RDW 13.0 01/12/2023 0213   LYMPHSABS 1.0 01/12/2023 0213   MONOABS 0.5 01/12/2023 0213   EOSABS 0.2 01/12/2023 0213   BASOSABS 0.0 01/12/2023 0213    BMET    Component Value Date/Time   NA 139 01/12/2023 0213   K 3.1 (L) 01/12/2023 0213   CL 103 01/12/2023 0213   CO2 25 01/12/2023 0213   GLUCOSE 103 (H) 01/12/2023 0213   BUN 11 01/12/2023 0213   CREATININE 0.82 01/12/2023 0213   CALCIUM 9.1 01/12/2023 0213   GFRNONAA >60 01/12/2023 0213   GFRAA >60 01/30/2018 0408    COAGS: Lab Results  Component Value Date   INR 1.2 01/12/2023     Non-Invasive Vascular Imaging:   EXAM:01/12/23 RIGHT LOWER EXTREMITY VENOUS DOPPLER ULTRASOUND   TECHNIQUE: Gray-scale sonography with compression, as well as color and duplex ultrasound, were performed to evaluate the deep venous system(s) from the level of the common femoral vein through the popliteal and proximal calf veins.   COMPARISON:  The similar study recently 01/08/2023    FINDINGS: VENOUS   Normal compressibility of the common femoral, superficial femoral, and popliteal veins, as well as the visualized calf veins. Visualized portions of profunda femoral vein and great saphenous vein unremarkable. No filling defects to suggest DVT on grayscale or color Doppler imaging. Doppler waveforms show normal direction of venous flow, normal respiratory plasticity and response to augmentation.   Limited views of the contralateral common femoral vein are unremarkable.   OTHER   Still present and not significantly changed, anterior to the proximal superficial femoral vein there is an elongate complex cystic lesion with no internal color flow, measuring 5.2 x 0.7 x 2.2 cm.   Limitations: none   IMPRESSION: 1. No evidence of deep venous thrombosis in the right lower extremity. 2. Unchanged complex cystic lesion anterior to the proximal superficial femoral vein, measuring 5.2 x 0.7 x 2.2 cm.  Statin:  Yes.   Beta Blocker:  No. Aspirin:  No. ACEI:  No. ARB:  No. CCB use:  No Other antiplatelets/anticoagulants:  Yes.   Plavix 75 mg Daily   ASSESSMENT/PLAN: This is a 67 y.o. male who presents to [facility] emergency department with pain and increased tingling sensation to his right lower extremity worsening with activity.  Patient is postoperative right femoral endarterectomy with iliac stent placement back in June 2024 with Dr. Festus Barren MD.  Vascular surgery plans on taking the patient to the vascular lab today for a right lower extremity angiogram with possible intervention.  I discussed in detail with the patient the procedure, benefits, risk, and complications.  Patient verbalizes understanding today.  Patient has had this procedure in the past he endorses.  I answered all the patient's questions this morning.  He has been n.p.o. since 3 PM yesterday afternoon.  We will proceed with the procedure later today.   -I discussed the plan in detail with Dr. Festus Barren MD and he is in agreement with the plan.   Marcie Bal [facility] 01/12/2023 8:42 AM

## 2023-01-12 NOTE — Op Note (Signed)
[facility]  Percutaneous Study/Intervention Procedural Note   Date of Surgery: 01/12/2023  Surgeon(s):Alaira Level    Assistants:none  Pre-operative Diagnosis: PAD with rest pain RLE  Post-operative diagnosis:  Same  Procedure(s) Performed:             1.  Ultrasound guidance for vascular access left femoral artery             2.  Catheter placement into right SFA from left femoral approach             3.  Aortogram and selective right lower extremity angiogram             4.  Mechanical thrombectomy of the right external iliac artery and right common femoral artery with the penumbra 7 bolt device             5.  Viabahn stent placement to the right common femoral artery with 8 mm diameter by 10 cm length Viabahn stent  6.  Viabahn stent to the right external iliac artery with 8 mm diameter by 5 cm length Viabahn stent             7.  StarClose closure device left femoral artery  EBL: 150 cc  Contrast: 65 cc  Fluoro Time: 16.7 minutes  Moderate Conscious Sedation Time: approximately 65 minutes using 4 mg of Versed and 150 mcg of Fentanyl              Indications:  Patient is a 67 y.o.male with a history of a femoral endarterectomy now with disabling claudication and early rest pain symptoms who presented to the emergency room.  He had a CT scan that showed occlusion of his iliac stent in the right external iliac artery and occlusion to the right common femoral artery. The patient is brought in for angiography for further evaluation and potential treatment.  Due to the limb threatening nature of the situation, angiogram was performed for attempted limb salvage. The patient is aware that if the procedure fails, amputation would be expected.  The patient also understands that even with successful revascularization, amputation may still be required due to the severity of the situation.  Risks and benefits are discussed and informed consent is obtained.   Procedure:   The patient was identified and appropriate procedural time out was performed.  The patient was then placed supine on the table and prepped and draped in the usual sterile fashion. Moderate conscious sedation was administered during a face to face encounter with the patient throughout the procedure with my supervision of the RN administering medicines and monitoring the patient's vital signs, pulse oximetry, telemetry and mental status throughout from the start of the procedure until the patient was taken to the recovery room. Ultrasound was used to evaluate the left common femoral artery.  It was patent .  A digital ultrasound image was acquired.  A Seldinger needle was used to access the left common femoral artery under direct ultrasound guidance and a permanent image was performed.  A 0.035 J wire was advanced without resistance and a 5Fr sheath was placed.  Pigtail catheter was placed into the aorta and an AP aortogram was performed. This demonstrated normal (multiple) renal arteries.  The aorta was somewhat irregular in both the mid and distal segment.  The left iliac system appeared to have mild stenosis approaching 50% in the external iliac artery.  The right common iliac artery stent was patent down to the right hypogastric artery  but the right external iliac artery stent and right common femoral artery were occluded thrombosed. I then crossed the aortic bifurcation and advanced to the right femoral head and then down into the right SFA.  Imaging was performed.  The right SFA had 3 areas of 30 to 50% stenosis but nothing that appeared flow-limiting.  The popliteal artery was fairly normal and there was good tibial runoff distally through all 3 vessels.  It was felt that it was in the patient's best interest to proceed with intervention after these images to avoid a second procedure and a larger amount of contrast and fluoroscopy based off of the findings from the initial angiogram. The patient was  systemically heparinized and a 7 French 23 cm sheath was then placed over the Terumo Advantage wire. I then proceeded with mechanical thrombectomy using the penumbra 7 bolt device in the right common iliac artery, external iliac artery, and common femoral artery down to the proximal SFA.  6 mg of tPA were also instilled in the iliac and femoral artery through the penumbra device.  Using the penumbra device, we exchanged for a V18 wire as well.  Imaging following thrombectomy did show improvement in the thrombus burden but there remained high-grade residual stenosis in the common femoral artery and external iliac artery.  There was still some thrombus burden and I felt placing covered stents in this location would give [place] our best chance of patency short of a redo surgical operation which would be the other option.  An 8 mm diameter by 10 cm length Viabahn stent was taken down to the mid to distal common femoral artery and brought back into the external iliac artery.  An 8 mm diameter by 5 cm length Viabahn stent was then deployed in the right external iliac artery up to the hypogastric origin.  This was postdilated with a 7 mm diameter Lutonix drug-coated balloon distally and then a 9 mm diameter Lutonix drug-coated balloon proximally and the 9 mm balloon was used to treat the common iliac artery stent that had been previously placed as well.  Completion imaging showed marked improvement with less than 20% residual stenosis throughout the right iliac system and right common femoral artery and the flow distally seem to be preserved.  I elected to terminate the procedure. The sheath was removed and StarClose closure device was deployed in the left femoral artery with excellent hemostatic result. The patient was taken to the recovery room in stable condition having tolerated the procedure well.  Findings:               Aortogram:  This demonstrated normal (multiple) renal arteries.  The aorta was somewhat irregular  in both the mid and distal segment.  The left iliac system appeared to have mild stenosis approaching 50% in the external iliac artery.  The right common iliac artery stent was patent down to the right hypogastric artery but the right external iliac artery stent and right common femoral artery were occluded thrombosed             Right lower Extremity:  The right SFA had 3 areas of 30 to 50% stenosis but nothing that appeared flow-limiting.  The popliteal artery was fairly normal and there was good tibial runoff distally through all 3 vessels.   Disposition: Patient was taken to the recovery room in stable condition having tolerated the procedure well.  Complications: None  Aaron Knox 01/12/2023 1:58 PM   This note was created with [employer]  Medical transcription system. Any errors in dictation are purely unintentional.

## 2023-01-13 DIAGNOSIS — T82599A Other mechanical complication of unspecified cardiac and vascular devices and implants, initial encounter: Secondary | ICD-10-CM | POA: Diagnosis not present

## 2023-01-13 LAB — CBC
HCT: 33.2 % — ABNORMAL LOW (ref 39.0–52.0)
Hemoglobin: 11.3 g/dL — ABNORMAL LOW (ref 13.0–17.0)
MCH: 30.8 pg (ref 26.0–34.0)
MCHC: 34 g/dL (ref 30.0–36.0)
MCV: 90.5 fL (ref 80.0–100.0)
Platelets: 137 10*3/uL — ABNORMAL LOW (ref 150–400)
RBC: 3.67 MIL/uL — ABNORMAL LOW (ref 4.22–5.81)
RDW: 13.1 % (ref 11.5–15.5)
WBC: 4.7 10*3/uL (ref 4.0–10.5)
nRBC: 0 % (ref 0.0–0.2)

## 2023-01-13 LAB — BASIC METABOLIC PANEL
Anion gap: 7 (ref 5–15)
BUN: 11 mg/dL (ref 8–23)
CO2: 24 mmol/L (ref 22–32)
Calcium: 8.2 mg/dL — ABNORMAL LOW (ref 8.9–10.3)
Chloride: 105 mmol/L (ref 98–111)
Creatinine, Ser: 0.88 mg/dL (ref 0.61–1.24)
GFR, Estimated: >60 mL/min (ref >60)
Glucose, Bld: 102 mg/dL — ABNORMAL HIGH (ref 70–99)
Potassium: 3.6 mmol/L (ref 3.5–5.1)
Sodium: 136 mmol/L (ref 135–145)

## 2023-01-13 LAB — HEPARIN LEVEL (UNFRACTIONATED): Heparin Unfractionated: 0.55 [IU]/mL (ref 0.30–0.70)

## 2023-01-13 MED ORDER — APIXABAN (ELIQUIS) VTE STARTER PACK (10MG AND 5MG): ORAL_TABLET | ORAL | 0 refills | Status: DC

## 2023-01-13 MED ORDER — OXYCODONE HCL 5 MG PO TABS: 5.0000 mg | ORAL_TABLET | Freq: Four times a day (QID) | ORAL | 0 refills | Status: DC | PRN

## 2023-01-13 NOTE — Progress Notes (Addendum)
Brief note.  Full note to follow.  67 year old male history of a right-sided femoral enterectomy with symptoms consistent with arterial occlusion/claudication.  CT scan with occlusion of iliac stent in the right external iliac artery and occlusion to the right common femoral artery.  Status post angiography and mechanical thrombectomy of the right external iliac artery and right common femoral artery and stent placement in the right common femoral artery and right external iliac artery  Patient tolerated procedure well.  Ambulating without pain or difficulty.  Extremities warm with palpable pedal pulses.  Remains on heparin gtt.  Also on Plavix.  Plan: Will await formal vascular surgery recommendations.  I suspect patient may be able to discharge later today  Lolita Patella MD  No charge

## 2023-01-13 NOTE — Plan of Care (Signed)
Problem: Education: Goal: Knowledge of prescribed regimen will improve 01/13/2023 1331 by Murriel Hopper, Donald Pore, RN Outcome: Progressing 01/13/2023 1324 by Murriel Hopper, Donald Pore, RN Outcome: Progressing   Problem: Activity: Goal: Ability to tolerate increased activity will improve 01/13/2023 1331 by Murriel Hopper, Donald Pore, RN Outcome: Progressing 01/13/2023 1324 by Murriel Hopper, Donald Pore, RN Outcome: Progressing   Problem: Bowel/Gastric: Goal: Gastrointestinal status for postoperative course will improve 01/13/2023 1331 by Murriel Hopper, Donald Pore, RN Outcome: Progressing 01/13/2023 1324 by Murriel Hopper, Donald Pore, RN Outcome: Progressing   Problem: Clinical Measurements: Goal: Postoperative complications will be avoided or minimized 01/13/2023 1331 by Murriel Hopper, Donald Pore, RN Outcome: Progressing 01/13/2023 1324 by Murriel Hopper, Donald Pore, RN Outcome: Progressing 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 01/13/2023 1331 by Murriel Hopper, Donald Pore, RN Outcome: Progressing 01/13/2023 1324 by Murriel Hopper, Donald Pore, RN Outcome: Progressing   Problem: Skin Integrity: Goal: Demonstration of wound healing without infection will improve 01/13/2023 1331 by Murriel Hopper, Donald Pore, RN Outcome: Progressing 01/13/2023 1324 by Murriel Hopper, Donald Pore, RN Outcome: Progressing   Problem: Education: Goal: Knowledge of General Education information will improve Description: Including pain rating scale, medication(s)/side effects and non-pharmacologic comfort measures 01/13/2023 1331 by Murriel Hopper, Donald Pore, RN Outcome: Progressing 01/13/2023 1324 by Murriel Hopper, Donald Pore, RN Outcome: Progressing   Problem: Health Behavior/Discharge Planning: Goal: Ability to manage health-related needs will improve 01/13/2023 1331 by Murriel Hopper, Donald Pore, RN Outcome: Progressing 01/13/2023 1324 by Murriel Hopper, Donald Pore, RN Outcome: Progressing   Problem: Clinical  Measurements: Goal: Ability to maintain clinical measurements within normal limits will improve 01/13/2023 1331 by Murriel Hopper, Donald Pore, RN Outcome: Progressing 01/13/2023 1324 by Murriel Hopper, Donald Pore, RN Outcome: Progressing Goal: Will remain free from infection 01/13/2023 1331 by Murriel Hopper, Donald Pore, RN Outcome: Progressing 01/13/2023 1324 by Murriel Hopper, Donald Pore, RN Outcome: Progressing Goal: Diagnostic test results will improve 01/13/2023 1331 by Monica Becton, RN Outcome: Progressing 01/13/2023 1324 by Murriel Hopper, Donald Pore, RN Outcome: Progressing Goal: Respiratory complications will improve 01/13/2023 1331 by Monica Becton, RN Outcome: Progressing 01/13/2023 1324 by Murriel Hopper, Donald Pore, RN Outcome: Progressing Goal: Cardiovascular complication will be avoided 01/13/2023 1331 by Monica Becton, RN Outcome: Progressing 01/13/2023 1324 by Monica Becton, RN Outcome: Progressing   Problem: Activity: Goal: Risk for activity intolerance will decrease 01/13/2023 1331 by Murriel Hopper, Donald Pore, RN Outcome: Progressing 01/13/2023 1324 by Murriel Hopper, Donald Pore, RN Outcome: Progressing   Problem: Nutrition: Goal: Adequate nutrition will be maintained 01/13/2023 1331 by Murriel Hopper, Donald Pore, RN Outcome: Progressing 01/13/2023 1324 by Murriel Hopper, Donald Pore, RN Outcome: Progressing   Problem: Coping: Goal: Level of anxiety will decrease 01/13/2023 1331 by Murriel Hopper, Donald Pore, RN Outcome: Progressing 01/13/2023 1324 by Murriel Hopper, Donald Pore, RN Outcome: Progressing   Problem: Elimination: Goal: Will not experience complications related to bowel motility 01/13/2023 1331 by Murriel Hopper, Donald Pore, RN Outcome: Progressing 01/13/2023 1324 by Murriel Hopper, Donald Pore, RN Outcome: Progressing Goal: Will not experience complications related to urinary retention 01/13/2023 1331 by Monica Becton, RN Outcome:  Progressing 01/13/2023 1324 by Murriel Hopper, Donald Pore, RN Outcome: Progressing   Problem: Pain Managment: Goal: General experience of comfort will improve 01/13/2023 1331 by Murriel Hopper, Donald Pore, RN Outcome: Progressing 01/13/2023 1324 by Murriel Hopper, Donald Pore, RN Outcome: Progressing   Problem: Safety: Goal: Ability to remain free from injury will improve 01/13/2023 1331 by Murriel Hopper, Donald Pore, RN Outcome: Progressing 01/13/2023 1324 by Murriel Hopper, Donald Pore, RN Outcome: Progressing   Problem: Skin Integrity: Goal: Risk for impaired skin integrity will decrease 01/13/2023 1331  by Monica Becton, RN Outcome: Progressing 01/13/2023 1324 by Murriel Hopper, Donald Pore, RN Outcome: Progressing

## 2023-01-13 NOTE — Plan of Care (Addendum)
The patient has been discharged. IV has been removed. Education completed with the patient and significant other. The patient has been wheeled down to his vehicle.  Problem: Education: Goal: Knowledge of prescribed regimen will improve 01/13/2023 1402 by Monica Becton, RN Outcome: Completed/Met 01/13/2023 1331 by Murriel Hopper, Donald Pore, RN Outcome: Progressing 01/13/2023 1324 by Murriel Hopper, Donald Pore, RN Outcome: Progressing   Problem: Activity: Goal: Ability to tolerate increased activity will improve 01/13/2023 1402 by Monica Becton, RN Outcome: Completed/Met 01/13/2023 1331 by Murriel Hopper, Donald Pore, RN Outcome: Progressing 01/13/2023 1324 by Murriel Hopper, Donald Pore, RN Outcome: Progressing   Problem: Bowel/Gastric: Goal: Gastrointestinal status for postoperative course will improve 01/13/2023 1402 by Murriel Hopper, Donald Pore, RN Outcome: Completed/Met 01/13/2023 1331 by Murriel Hopper, Donald Pore, RN Outcome: Progressing 01/13/2023 1324 by Murriel Hopper, Donald Pore, RN Outcome: Progressing   Problem: Clinical Measurements: Goal: Postoperative complications will be avoided or minimized 01/13/2023 1402 by Murriel Hopper, Donald Pore, RN Outcome: Completed/Met 01/13/2023 1331 by Murriel Hopper, Donald Pore, RN Outcome: Progressing 01/13/2023 1324 by Murriel Hopper, Donald Pore, RN Outcome: Progressing 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 01/13/2023 1402 by Murriel Hopper, Donald Pore, RN Outcome: Completed/Met 01/13/2023 1331 by Murriel Hopper, Donald Pore, RN Outcome: Progressing 01/13/2023 1324 by Murriel Hopper, Donald Pore, RN Outcome: Progressing   Problem: Skin Integrity: Goal: Demonstration of wound healing without infection will improve 01/13/2023 1402 by Monica Becton, RN Outcome: Completed/Met 01/13/2023 1331 by Murriel Hopper, Donald Pore, RN Outcome: Progressing 01/13/2023 1324 by Murriel Hopper, Donald Pore, RN Outcome: Progressing   Problem:  Education: Goal: Knowledge of General Education information will improve Description: Including pain rating scale, medication(s)/side effects and non-pharmacologic comfort measures 01/13/2023 1402 by Murriel Hopper, Donald Pore, RN Outcome: Completed/Met 01/13/2023 1331 by Monica Becton, RN Outcome: Progressing 01/13/2023 1324 by Murriel Hopper, Donald Pore, RN Outcome: Progressing   Problem: Health Behavior/Discharge Planning: Goal: Ability to manage health-related needs will improve 01/13/2023 1402 by Monica Becton, RN Outcome: Completed/Met 01/13/2023 1331 by Murriel Hopper, Donald Pore, RN Outcome: Progressing 01/13/2023 1324 by Murriel Hopper, Donald Pore, RN Outcome: Progressing   Problem: Clinical Measurements: Goal: Ability to maintain clinical measurements within normal limits will improve 01/13/2023 1402 by Murriel Hopper, Donald Pore, RN Outcome: Completed/Met 01/13/2023 1331 by Monica Becton, RN Outcome: Progressing 01/13/2023 1324 by Murriel Hopper, Donald Pore, RN Outcome: Progressing Goal: Will remain free from infection 01/13/2023 1402 by Murriel Hopper, Donald Pore, RN Outcome: Completed/Met 01/13/2023 1331 by Monica Becton, RN Outcome: Progressing 01/13/2023 1324 by Murriel Hopper, Donald Pore, RN Outcome: Progressing Goal: Diagnostic test results will improve 01/13/2023 1402 by Monica Becton, RN Outcome: Completed/Met 01/13/2023 1331 by Monica Becton, RN Outcome: Progressing 01/13/2023 1324 by Murriel Hopper, Donald Pore, RN Outcome: Progressing Goal: Respiratory complications will improve 01/13/2023 1402 by Monica Becton, RN Outcome: Completed/Met 01/13/2023 1331 by Monica Becton, RN Outcome: Progressing 01/13/2023 1324 by Murriel Hopper, Donald Pore, RN Outcome: Progressing Goal: Cardiovascular complication will be avoided 01/13/2023 1402 by Monica Becton, RN Outcome: Completed/Met 01/13/2023 1331 by Monica Becton, RN Outcome: Progressing 01/13/2023 1324 by Monica Becton, RN Outcome: Progressing   Problem: Activity: Goal: Risk for activity intolerance will decrease 01/13/2023 1402 by Murriel Hopper, Donald Pore, RN Outcome: Completed/Met 01/13/2023 1331 by Murriel Hopper, Donald Pore, RN Outcome: Progressing 01/13/2023 1324 by Murriel Hopper, Donald Pore, RN Outcome: Progressing   Problem: Nutrition: Goal: Adequate nutrition will be maintained 01/13/2023 1402 by Monica Becton, RN Outcome: Completed/Met 01/13/2023 1331 by Monica Becton, RN Outcome: Progressing 01/13/2023 1324 by Monica Becton, RN Outcome: Progressing  Problem: Coping: Goal: Level of anxiety will decrease 01/13/2023 1402 by Murriel Hopper, Donald Pore, RN Outcome: Completed/Met 01/13/2023 1331 by Murriel Hopper, Donald Pore, RN Outcome: Progressing 01/13/2023 1324 by Murriel Hopper, Donald Pore, RN Outcome: Progressing   Problem: Elimination: Goal: Will not experience complications related to bowel motility 01/13/2023 1402 by Murriel Hopper, Donald Pore, RN Outcome: Completed/Met 01/13/2023 1331 by Murriel Hopper, Donald Pore, RN Outcome: Progressing 01/13/2023 1324 by Murriel Hopper, Donald Pore, RN Outcome: Progressing Goal: Will not experience complications related to urinary retention 01/13/2023 1402 by Monica Becton, RN Outcome: Completed/Met 01/13/2023 1331 by Murriel Hopper, Donald Pore, RN Outcome: Progressing 01/13/2023 1324 by Murriel Hopper, Donald Pore, RN Outcome: Progressing   Problem: Pain Managment: Goal: General experience of comfort will improve 01/13/2023 1402 by Monica Becton, RN Outcome: Completed/Met 01/13/2023 1331 by Monica Becton, RN Outcome: Progressing 01/13/2023 1324 by Murriel Hopper, Donald Pore, RN Outcome: Progressing   Problem: Safety: Goal: Ability to remain free from injury will improve 01/13/2023 1402 by Monica Becton, RN Outcome:  Completed/Met 01/13/2023 1331 by Monica Becton, RN Outcome: Progressing 01/13/2023 1324 by Murriel Hopper, Donald Pore, RN Outcome: Progressing   Problem: Skin Integrity: Goal: Risk for impaired skin integrity will decrease 01/13/2023 1402 by Monica Becton, RN Outcome: Completed/Met 01/13/2023 1331 by Monica Becton, RN Outcome: Progressing 01/13/2023 1324 by Monica Becton, RN Outcome: Progressing

## 2023-01-13 NOTE — Discharge Summary (Signed)
Physician Discharge Summary  Aaron Knox Sidle VWU:[id number] DOB: [birth year] DOA: 01/12/2023  PCP: Pcp, No  Admit date: 01/12/2023 Discharge date: 01/13/2023  Admitted From: Home Disposition:  Home  Recommendations for Outpatient Follow-up:  Follow up with PCP in 1-2 weeks Follow up with vascular 1-2 weeks  Home Health:No  Equipment/Devices:None   Discharge Condition:Stable  CODE STATUS:FULL  Diet recommendation: Heart healthy  Brief/Interim Summary: 67 y.o. male with medical history significant of CAD, PAD status post right femoral endarterectomy and right iliac stent, COPD, presented with claudication of right leg.   Symptoms started 2 days ago, patient started to feel tingling sensation of the lower calf, worsening with activity, initially he thought that he might just sprained muscle but since yesterday symptoms has getting worse and he started to feel cramping-like pain of the whole calf when walking.  Patient underwent right femoral endarterectomy in June 2024 and since then patient has developed a seroma of the right groin area and came to ED 2 times but this time he denied any pain or swelling of the right groin area.  Underwent emergent event genogram with vascular surgery.  Status post mechanical thrombectomy to right common femoral artery and right external iliac artery.  Stent placement x 2.  Tolerated procedure well.  Remained stable postprocedure.  Pain resolved.  Ambulating without difficulty at time of discharge.  Discussed with vascular surgery.  Recommend discharge on Eliquis and Plavix.  Prescription sent to outpatient pharmacy.  Follow-up outpatient vascular surgery 1 to 2 weeks.    Discharge Diagnoses:  Principal Problem:   Occlusion of stent of peripheral artery, initial encounter Active Problems:   Atherosclerosis of artery of extremity with rest pain (HCC)   Acute lower limb ischemia  Atherosclerosis of artery of extremity with rest pain (HCC)   Limb  ischemia Acute claudication Acute occlusion of origin of right internal iliac artery and limb ischemia of right leg Patient was taken emergently to vascular lab and underwent angiogram with mechanical thrombectomy and stent placement x 2.  Tolerated procedure well.  Maintained on heparin GTT for following 24 hours.  Ambulating without difficulty at time of discharge.  Discussed with vascular surgery.  Cleared for discharge home at this time.  Recommend discharge on Eliquis and Plavix.  Resume home statin.  Oxycodone as needed for severe pain.  Follow-up outpatient vascular surgery in 1 to 2 weeks.    Discharge Instructions  Discharge Instructions     Diet - low sodium heart healthy   Complete by: As directed    Increase activity slowly   Complete by: As directed       Allergies as of 01/13/2023       Reactions   Aspirin Anaphylaxis   Penicillins Other (See Comments)   Childhood allergy per pt's mother. He does not recall any details of the reaction. Willing to try beta lactams.        Medication List     STOP taking these medications    sulfamethoxazole-trimethoprim 800-160 MG tablet Commonly known as: Bactrim DS       TAKE these medications    Apixaban Starter Pack (10mg  and 5mg ) Commonly known as: ELIQUIS STARTER PACK Take as directed on package: start with two-5mg  tablets twice daily for 7 days. On day 8, switch to one-5mg  tablet twice daily.   atorvastatin 80 MG tablet Commonly known as: LIPITOR Take 1 tablet (80 mg total) by mouth daily at 6 PM. What changed: when to take this   clopidogrel  75 MG tablet Commonly known as: PLAVIX Take 75 mg by mouth daily.   oxyCODONE 5 MG immediate release tablet Commonly known as: Oxy IR/ROXICODONE Take 1 tablet (5 mg total) by mouth every 6 (six) hours as needed for severe pain.        Follow-up Information     Dew, Marlow Baars, MD. Schedule an appointment as soon as possible for a visit in 1 week(s).   Specialties:  Vascular Surgery, Radiology, Interventional Cardiology Contact information: [address] [place] [address] [phone]                Allergies  Allergen Reactions   Aspirin Anaphylaxis   Penicillins Other (See Comments)    Childhood allergy per pt's mother. He does not recall any details of the reaction. Willing to try beta lactams.    Consultations: Vascular surgery   Procedures/Studies: PERIPHERAL VASCULAR CATHETERIZATION  Result Date: 01/12/2023 See surgical note for result.  US Venous Img Lower Unilateral Right  Result Date: 01/12/2023 CLINICAL DATA:  Right calf pain and swelling. EXAM: RIGHT LOWER EXTREMITY VENOUS DOPPLER ULTRASOUND TECHNIQUE: Gray-scale sonography with compression, as well as color and duplex ultrasound, were performed to evaluate the deep venous system(s) from the level of the common femoral vein through the popliteal and proximal calf veins. COMPARISON:  The similar study recently 01/08/2023 FINDINGS: VENOUS Normal compressibility of the common femoral, superficial femoral, and popliteal veins, as well as the visualized calf veins. Visualized portions of profunda femoral vein and great saphenous vein unremarkable. No filling defects to suggest DVT on grayscale or color Doppler imaging. Doppler waveforms show normal direction of venous flow, normal respiratory plasticity and response to augmentation. Limited views of the contralateral common femoral vein are unremarkable. OTHER Still present and not significantly changed, anterior to the proximal superficial femoral vein there is an elongate complex cystic lesion with no internal color flow, measuring 5.2 x 0.7 x 2.2 cm. Limitations: none IMPRESSION: 1. No evidence of deep venous thrombosis in the right lower extremity. 2. Unchanged complex cystic lesion anterior to the proximal superficial femoral vein, measuring 5.2 x 0.7 x 2.2 cm. Electronically Signed   By: Almira Bar M.D.   On:  01/12/2023 04:51   CT ANGIO AO+BIFEM W & OR WO CONTRAST  Result Date: 01/12/2023 CLINICAL DATA:  Right lower extremity numbness, history of prior iliac stent placement, initial encounter EXAM: CT ANGIOGRAPHY OF ABDOMINAL AORTA WITH ILIOFEMORAL RUNOFF TECHNIQUE: Multidetector CT imaging of the abdomen, pelvis and lower extremities was performed using the standard protocol during bolus administration of intravenous contrast. Multiplanar CT image reconstructions and MIPs were obtained to evaluate the vascular anatomy. RADIATION DOSE REDUCTION: This exam was performed according to the departmental dose-optimization program which includes automated exposure control, adjustment of the mA and/or kV according to patient size and/or use of iterative reconstruction technique. CONTRAST:  OMNIPAQUE IOHEXOL 350 MG/ML SOLN COMPARISON:  None Available. FINDINGS: VASCULAR Aorta: Abdominal aorta demonstrates atherosclerotic calcification without aneurysmal dilatation or dissection. Celiac: Patent without evidence of aneurysm, dissection, vasculitis or significant stenosis. SMA: Patent without evidence of aneurysm, dissection, vasculitis or significant stenosis. Renals: Both renal arteries are patent without evidence of aneurysm, dissection, vasculitis, fibromuscular dysplasia or significant stenosis. Dual renal arteries are noted bilaterally. IMA: Patent without evidence of aneurysm, dissection, vasculitis or significant stenosis. RIGHT Lower Extremity Inflow: Common iliac artery stent is noted on the right. It appears widely patent. Just beyond the origin of the right internal iliac artery  however the stent shows diffuse occlusion to the level of the common femoral artery. Runoff: Reconstitution of the superficial femoral artery is noted. Scattered atherosclerotic calcifications are seen. The popliteal artery on the right is within normal limits. Popliteal trifurcation is patent with three-vessel runoff to the right  ankle. LEFT Lower Extremity Inflow: Atherosclerotic calcifications are noted in the left iliac system without focal stenosis. Common femoral artery is within normal limits. Runoff: Femoral bifurcation is patent. Superficial femoral arteries demonstrates scattered atherosclerotic calcifications. Popliteal artery is patent with a normal patent trifurcation. The anterior tibial artery is occluded beyond its origin. Peroneal and posterior tibial arteries continue to the level of the left ankle. Some collateral flow reconstitutes the dorsalis pedis artery in the left foot. Veins: No specific venous abnormality is noted. Review of the MIP images confirms the above findings. NON-VASCULAR Lower chest: No acute abnormality. Hepatobiliary: No focal liver abnormality is seen. No gallstones, gallbladder wall thickening, or biliary dilatation. Pancreas: Unremarkable. No pancreatic ductal dilatation or surrounding inflammatory changes. Spleen: Normal in size without focal abnormality. Adrenals/Urinary Tract: Adrenal glands are within normal limits. Kidneys are well visualized bilaterally. No renal calculi or obstructive changes are seen. Simple cysts are noted within the right kidney. No follow-up is recommended. No obstructive changes are seen. The bladder is well distended. Stomach/Bowel: No obstructive or inflammatory changes of the colon are noted. The appendix is within normal limits. Small bowel and stomach are unremarkable. Lymphatic: No specific lymphadenopathy is noted. Reproductive: Prostate is unremarkable. Other: No free fluid is noted. Musculoskeletal: No acute or significant osseous findings. IMPRESSION: VASCULAR Right iliac arterial stents are seen. There is occlusion of the distal aspect of the stent just beyond the origin of the right internal iliac artery. Three-vessel runoff to the right ankle. Two vessel runoff to the left ankle. NON-VASCULAR No acute abnormality is noted to correspond with the given clinical  history. Electronically Signed   By: Alcide Clever M.D.   On: 01/12/2023 03:44   [place] Lower Ext Art Right Ltd  Result Date: 01/08/2023 CLINICAL DATA:  Right groin pain and swelling. Post iliac stent placement and femoral endarterectomy is. EXAM: RIGHT LOWER EXTREMITY ARTERIAL DUPLEX EVAL TECHNIQUE: Ultrasound examination was performed including evaluation of the muscles, tendons, joint, and adjacent soft tissues. COMPARISON:  Right lower extremity venous Doppler examination 11/03/2022 FINDINGS: Targeted ultrasound imaging of the right groin is obtained with additional color flow Doppler imaging. Corresponding to the area of pain and swelling, there is a small complex fluid collection measuring 5.8 x 2.6 x 1 cm. The collection is located anterior to the proximal superficial femoral artery. No flow is demonstrated within the lesion on color flow Doppler images suggesting no evidence of active pseudoaneurysm. The appearance could be due to a hematoma or postoperative collection. Doppler evaluation of the adjacent common femoral artery, common femoral vein, superficial femoral artery and superficial femoral vein demonstrate patency of the visualized structures. IMPRESSION: Corresponding to palpable area of pain and swelling in the right groin, there is a small complex fluid collection measuring 5.8 cm maximal diameter. No flow is demonstrated on color flow Doppler imaging. Adjacent vasculature is patent. Electronically Signed   By: Burman Nieves M.D.   On: 01/08/2023 20:01   VAS US AORTA/IVC/ILIACS  Result Date: 01/01/2023 ABDOMINAL AORTA STUDY Patient Name:  NORBERTO WISHON  Date of Exam:   01/01/2023 Medical Rec #: [id number]           Accession #:    [id number] Date  of Birth: November 15, 1955           Patient Gender: M Patient Age:   41 years Exam Location:  [facility] Procedure:      VAS US AORTA/IVC/ILIACS Referring Phys: Sheppard Plumber  --------------------------------------------------------------------------------  Indications: Surgery date 09/20/2022. Risk Factors: Hypertension, hyperlipidemia, past history of smoking, prior MI. Vascular Interventions: 09/20/2022 Right femoral endarterectomy and right common                         iliac stent                          08/07/2022: Catheter placement into Right CIA from Left                         Femoral approach. Aortogram and Selective Right Lower                         Extremity Angiogram.  Performing Technologist: Hardie Lora RVT  Examination Guidelines: A complete evaluation includes B-mode imaging, spectral Doppler, color Doppler, and power Doppler as needed of all accessible portions of each vessel. Bilateral testing is considered an integral part of a complete examination. Limited examinations for reoccurring indications may be performed as noted.  Abdominal Aorta Findings: +-----------+-------+----------+----------+--------+--------+--------+ Location   AP (cm)Trans (cm)PSV (cm/s)WaveformThrombusComments +-----------+-------+----------+----------+--------+--------+--------+ Proximal   2.17   2.17      65                                 +-----------+-------+----------+----------+--------+--------+--------+ Mid        1.70   1.86      44                                 +-----------+-------+----------+----------+--------+--------+--------+ Distal     1.64   1.55      52                                 +-----------+-------+----------+----------+--------+--------+--------+ RT CIA Prox0.6    0.7       128                                +-----------+-------+----------+----------+--------+--------+--------+ LT CIA Prox1.9    2.3       89                        fusiform +-----------+-------+----------+----------+--------+--------+--------+ Right Stent(s): +---------------+--------+--------+--------+--------+ CIA to CFA     PSV  cm/sStenosisWaveformComments +---------------+--------+--------+--------+--------+ Prox to Stent  47                               +---------------+--------+--------+--------+--------+ Proximal Stent 103                              +---------------+--------+--------+--------+--------+ Mid Stent      123                              +---------------+--------+--------+--------+--------+  Distal Stent   119                              +---------------+--------+--------+--------+--------+ Distal to Stent1021                             +---------------+--------+--------+--------+--------+ Seroma seen adjacent to CFA measuring 1.19 cm x 2.63 cm x 5.55 cm.   Summary: Abdominal Aorta: There is evidence of abnormal dilation of the Left Common Iliac artery. Stenosis: +--------------------+-----------+ Location            Stent       +--------------------+-----------+ Right Common Iliac  no stenosis +--------------------+-----------+ Right External Iliacno stenosis +--------------------+-----------+   *See table(s) above for measurements and observations.  Electronically signed by Festus Barren MD on 01/01/2023 at 2:15:15 PM.    Final    VAS [place] ABI WITH/WO TBI  Result Date: 01/01/2023  LOWER EXTREMITY DOPPLER STUDY Patient Name:  Malachy Coleman [facility]  Date of Exam:   01/01/2023 Medical Rec #: [id number]           Accession #:    [id number] Date of Birth: [birth year]           Patient Gender: M Patient Age:   48 years Exam Location:  [facility] Procedure:      VAS [place] ABI WITH/WO TBI Referring Phys: Sheppard Plumber --------------------------------------------------------------------------------  Indications: Claudication, and peripheral artery disease. High Risk Factors: Hyperlipidemia, past history of smoking, prior MI, coronary                    artery disease.  Vascular Interventions: 09/20/2022 Right femoral endarterectomy and right common                         iliac stent                           08/07/2022: Catheter placement into Right CIA from Left                         Femoral approach. Aortogram and Selective Right Lower                         Extremity Angiogram. Performing Technologist: Hardie Lora RVT  Examination Guidelines: A complete evaluation includes at minimum, Doppler waveform signals and systolic blood pressure reading at the level of bilateral brachial, anterior tibial, and posterior tibial arteries, when vessel segments are accessible. Bilateral testing is considered an integral part of a complete examination. Photoelectric Plethysmograph (PPG) waveforms and toe systolic pressure readings are included as required and additional duplex testing as needed. Limited examinations for reoccurring indications may be performed as noted.  ABI Findings: +---------+------------------+-----+---------+--------+ Right    Rt Pressure (mmHg)IndexWaveform Comment  +---------+------------------+-----+---------+--------+ Brachial 163                                      +---------+------------------+-----+---------+--------+ PTA      169               1.04 triphasic         +---------+------------------+-----+---------+--------+ DP       152  0.93 biphasic          +---------+------------------+-----+---------+--------+ Great Toe121               0.74                   +---------+------------------+-----+---------+--------+ +---------+------------------+-----+---------+-------+ Left     Lt Pressure (mmHg)IndexWaveform Comment +---------+------------------+-----+---------+-------+ Brachial 161                                     +---------+------------------+-----+---------+-------+ PTA      159               0.98 triphasic        +---------+------------------+-----+---------+-------+ DP       144               0.88 biphasic         +---------+------------------+-----+---------+-------+ Great Toe141                0.87                  +---------+------------------+-----+---------+-------+ +-------+-----------+-----------+------------+------------+ ABI/TBIToday's ABIToday's TBIPrevious ABIPrevious TBI +-------+-----------+-----------+------------+------------+ Right  1.04       0.74       0.94        0.89         +-------+-----------+-----------+------------+------------+ Left   0.98       0.87       0.93        0.82         +-------+-----------+-----------+------------+------------+ Bilateral ABIs appear essentially unchanged compared to prior study on 10/17/2022.  Summary: Right: Resting right ankle-brachial index is within normal range. The right toe-brachial index is normal. Left: Resting left ankle-brachial index is within normal range. The left toe-brachial index is normal. *See table(s) above for measurements and observations.  Electronically signed by Festus Barren MD on 01/01/2023 at 2:15:07 PM.    Final       Subjective: Seen and examined on the day of discharge.  Stable no distress.  Appropriate for discharge home.  Discharge Exam: Vitals:   01/13/23 0332 01/13/23 0804  BP: (!) 108/49 (!) 104/53  Pulse: 73 74  Resp: 14 18  Temp: 98.4 F (36.9 C) 98.1 F (36.7 C)  SpO2: 98% 98%   Vitals:   01/12/23 1946 01/12/23 2330 01/13/23 0332 01/13/23 0804  BP: (!) 104/50 (!) 141/66 (!) 108/49 (!) 104/53  Pulse: (!) 43 82 73 74  Resp: 18 18 14 18   Temp: 97.9 F (36.6 C) 98 F (36.7 C) 98.4 F (36.9 C) 98.1 F (36.7 C)  TempSrc: Oral Oral  Oral  SpO2: 97% 97% 98% 98%  Weight:      Height:        General: Pt is alert, awake, not in acute distress Cardiovascular: RRR, S1/S2 +, no rubs, no gallops Respiratory: CTA bilaterally, no wheezing, no rhonchi Abdominal: Soft, NT, ND, bowel sounds + Extremities: no edema, no cyanosis    The results of significant diagnostics from this hospitalization (including imaging, microbiology, ancillary and laboratory) are listed below for  reference.     Microbiology: No results found for this or any previous visit (from the past 240 hour(s)).   Labs: BNP (last 3 results) No results for input(s): "BNP" in the last 8760 hours. Basic Metabolic Panel: Recent Labs  Lab 01/08/23 1613 01/12/23 0213 01/13/23 0424  NA 139 139 136  K 3.3* 3.1*  3.6  CL 103 103 105  CO2 27 25 24   GLUCOSE 122* 103* 102*  BUN 8 11 11   CREATININE 0.87 0.82 0.88  CALCIUM 9.4 9.1 8.2*  MG  --  2.2  --    Liver Function Tests: No results for input(s): "AST", "ALT", "ALKPHOS", "BILITOT", "PROT", "ALBUMIN" in the last 168 hours. No results for input(s): "LIPASE", "AMYLASE" in the last 168 hours. No results for input(s): "AMMONIA" in the last 168 hours. CBC: Recent Labs  Lab 01/08/23 1613 01/12/23 0213 01/13/23 0424  WBC 5.1 4.8 4.7  NEUTROABS  --  3.1  --   HGB 13.2 13.6 11.3*  HCT 38.9* 40.8 33.2*  MCV 89.8 91.7 90.5  PLT 203 164 137*   Cardiac Enzymes: No results for input(s): "CKTOTAL", "CKMB", "CKMBINDEX", "TROPONINI" in the last 168 hours. BNP: Invalid input(s): "POCBNP" CBG: No results for input(s): "GLUCAP" in the last 168 hours. D-Dimer No results for input(s): "DDIMER" in the last 72 hours. Hgb A1c No results for input(s): "HGBA1C" in the last 72 hours. Lipid Profile No results for input(s): "CHOL", "HDL", "LDLCALC", "TRIG", "CHOLHDL", "LDLDIRECT" in the last 72 hours. Thyroid function studies No results for input(s): "TSH", "T4TOTAL", "T3FREE", "THYROIDAB" in the last 72 hours.  Invalid input(s): "FREET3" Anemia work up No results for input(s): "VITAMINB12", "FOLATE", "FERRITIN", "TIBC", "IRON", "RETICCTPCT" in the last 72 hours. Urinalysis    Component Value Date/Time   COLORURINE STRAW (A) 10/31/2022 2230   APPEARANCEUR CLEAR (A) 10/31/2022 2230   LABSPEC 1.015 10/31/2022 2230   PHURINE 6.0 10/31/2022 2230   GLUCOSEU NEGATIVE 10/31/2022 2230   HGBUR NEGATIVE 10/31/2022 2230   BILIRUBINUR NEGATIVE 10/31/2022  2230   KETONESUR NEGATIVE 10/31/2022 2230   PROTEINUR NEGATIVE 10/31/2022 2230   NITRITE NEGATIVE 10/31/2022 2230   LEUKOCYTESUR NEGATIVE 10/31/2022 2230   Sepsis Labs Recent Labs  Lab 01/08/23 1613 01/12/23 0213 01/13/23 0424  WBC 5.1 4.8 4.7   Microbiology No results found for this or any previous visit (from the past 240 hour(s)).   Time coordinating discharge: Over 30 minutes  SIGNED:   Tresa Moore, MD  [facility] 01/13/2023, 12:45 PM Pager   If 7PM-7AM, please contact night-coverage

## 2023-01-13 NOTE — Plan of Care (Signed)
  Problem: Education: Goal: Knowledge of prescribed regimen will improve Outcome: Progressing   Problem: Activity: Goal: Ability to tolerate increased activity will improve Outcome: Progressing   Problem: Bowel/Gastric: Goal: Gastrointestinal status for postoperative course will improve Outcome: Progressing   Problem: Clinical Measurements: Goal: Postoperative complications will be avoided or minimized Outcome: Progressing 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 Outcome: Progressing   Problem: Skin Integrity: Goal: Demonstration of wound healing without infection will improve Outcome: Progressing   Problem: Education: Goal: Knowledge of General Education information will improve Description: Including pain rating scale, medication(s)/side effects and non-pharmacologic comfort measures Outcome: Progressing   Problem: Health Behavior/Discharge Planning: Goal: Ability to manage health-related needs will improve Outcome: Progressing   Problem: Clinical Measurements: Goal: Ability to maintain clinical measurements within normal limits will improve Outcome: Progressing Goal: Will remain free from infection Outcome: Progressing Goal: Diagnostic test results will improve Outcome: Progressing Goal: Respiratory complications will improve Outcome: Progressing Goal: Cardiovascular complication will be avoided Outcome: Progressing   Problem: Activity: Goal: Risk for activity intolerance will decrease Outcome: Progressing   Problem: Nutrition: Goal: Adequate nutrition will be maintained Outcome: Progressing   Problem: Coping: Goal: Level of anxiety will decrease Outcome: Progressing   Problem: Elimination: Goal: Will not experience complications related to bowel motility Outcome: Progressing Goal: Will not experience complications related to urinary retention Outcome: Progressing   Problem: Pain Managment: Goal: General experience of comfort will  improve Outcome: Progressing   Problem: Safety: Goal: Ability to remain free from injury will improve Outcome: Progressing   Problem: Skin Integrity: Goal: Risk for impaired skin integrity will decrease Outcome: Progressing   

## 2023-01-13 NOTE — Progress Notes (Signed)
ANTICOAGULATION CONSULT NOTE  Pharmacy Consult for heparin infusion Indication: PVD  Allergies  Allergen Reactions   Aspirin Anaphylaxis   Penicillins Other (See Comments)    Childhood allergy per pt's mother. He does not recall any details of the reaction. Willing to try beta lactams.    Patient Measurements: Height: 6\' 2"  (188 cm) Weight: 86 kg (189 lb 9.5 oz) IBW/kg (Calculated) : 82.2 Heparin Dosing Weight: 86 kg  Vital Signs: Temp: 98.4 F (36.9 C) (10/05 0332) Temp Source: Oral (10/04 2330) BP: 108/49 (10/05 0332) Pulse Rate: 73 (10/05 0332)  Labs: Recent Labs    01/12/23 0213 01/12/23 0635 01/12/23 2104 01/13/23 0424  HGB 13.6  --   --  11.3*  HCT 40.8  --   --  33.2*  PLT 164  --   --  137*  APTT  --  31  --   --   LABPROT  --  15.0  --   --   INR  --  1.2  --   --   HEPARINUNFRC  --   --  0.50 0.55  CREATININE 0.82  --   --  0.88    Estimated Creatinine Clearance: 92.1 mL/min (by C-G formula based on SCr of 0.88 mg/dL).   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)    CAD in native artery    a.) LHC/PCI 08/09/2014 (setting of STEMI): 30% mLAD, 99% pLCx (3.0 x 15 mm Resolute Integrity DES x 1 with 10% residual post-intervention stenosis), 45% mLCx, 20% pRCA, 40% dRCA); b.) LHC 01/29/2018: 30% mLAD, 45% mLCx, 50% pRCA, 80% dRCA --> med mgmt   DDD (degenerative disc disease), lumbar    Diastolic dysfunction    a.) TTE 08/11/2014 (s/p STEMI): EF 45-50%, inf HK, AoV sclerosis, G1DD; b.) TTE 02/28/2018: EF 55-60%, G1DD   History of bilateral cataract extraction 2023   HTN (hypertension)    Hyperlipidemia    Long term current use of antithrombotics/antiplatelets    a.) clopidogrel   ST elevation myocardial infarction (STEMI) of true posterior wall (HCC) 08/09/2014   a.) LHC/PCI 08/09/2014: 3.0 x 15 mm Resolute Integrity DES x 1 pLCx with 10% residual post-intervention stenosis)    Unstable angina (HCC)     Assessment: Pt is a 67 yo male presenting to ED c/o "right sided lower extremity tingling sensation reminiscent of symptoms preceding his femoral endarterectomy and iliac stenting surgery" this past summer.  Goal of Therapy:  Heparin level 0.3-0.7 units/ml Monitor platelets by anticoagulation protocol: Yes   10/04 2104 HL 0.5, therapeutic x 1 10/05 0424 HL 0.55, therapeutic x 2  Plan: Continue heparin infusion at 1400 units/hr Will check HL w/ AM labs daily while therapeutic CBC daily while on heparin  Otelia Sergeant, PharmD, [facility] 01/13/2023 5:27 AM

## 2023-01-13 NOTE — Progress Notes (Signed)
    Subjective  - POD #1  Walked 5 times without issues   Physical Exam:  Left groin soft Palpable right DP       Assessment/Plan:  POD #1  OK for d/c on Plavix and Eliquis.  Patient has ASA allergy  Wells Kerith Sherley 01/13/2023 1:23 PM --  Vitals:   01/13/23 0332 01/13/23 0804  BP: (!) 108/49 (!) 104/53  Pulse: 73 74  Resp: 14 18  Temp: 98.4 F (36.9 C) 98.1 F (36.7 C)  SpO2: 98% 98%    Intake/Output Summary (Last 24 hours) at 01/13/2023 1323 Last data filed at 01/13/2023 0900 Gross per 24 hour  Intake 706.21 ml  Output --  Net 706.21 ml     Laboratory CBC    Component Value Date/Time   WBC 4.7 01/13/2023 0424   HGB 11.3 (L) 01/13/2023 0424   HCT 33.2 (L) 01/13/2023 0424   PLT 137 (L) 01/13/2023 0424    BMET    Component Value Date/Time   NA 136 01/13/2023 0424   K 3.6 01/13/2023 0424   CL 105 01/13/2023 0424   CO2 24 01/13/2023 0424   GLUCOSE 102 (H) 01/13/2023 0424   BUN 11 01/13/2023 0424   CREATININE 0.88 01/13/2023 0424   CALCIUM 8.2 (L) 01/13/2023 0424   GFRNONAA >60 01/13/2023 0424   GFRAA >60 01/30/2018 0408    COAG Lab Results  Component Value Date   INR 1.2 01/12/2023   No results found for: "PTT"  Antibiotics Anti-infectives (From admission, onward)    Start     Dose/Rate Route Frequency Ordered Stop   01/12/23 1118  ceFAZolin (ANCEF) IVPB 2g/100 mL premix        2 g 200 mL/hr over 30 Minutes Intravenous 30 min pre-op 01/12/23 1118 01/12/23 1531        V. Charlena Cross, M.D., [facility] [place] Office: [phone] Pager:  [phone]

## 2023-01-13 NOTE — Evaluation (Signed)
Physical Therapy Evaluation Patient Details Name: Aaron Knox MRN: [id number] DOB: [birth year] Today's Date: 01/13/2023  History of Present Illness  Patient is a 67 y.o.male with a history of a femoral endarterectomy now with disabling claudication and early rest pain symptoms who presented to the emergency room.  He had a CT scan that showed occlusion of his iliac stent in the right external iliac artery and occlusion to the right common femoral artery, s/p angiogram. PMH of PAD, CAD.   Clinical Impression  Patient A&Ox4, family in room, denied any pain of RLE with all mobility. He was able to perform bed mobility, transfers and ambulation independently. He ambulated >365ft with quick and steady gait velocity. The patient demonstrated and reported return to baseline level of functioning, no further acute PT needs indicated. PT to sign off. Please reconsult PT if pt status changes or acute needs are identified.          If plan is discharge home, recommend the following: Other (comment) (NA)   Can travel by private vehicle        Equipment Recommendations None recommended by PT  Recommendations for Other Services       Functional Status Assessment Patient has not had a recent decline in their functional status     Precautions / Restrictions Precautions Precautions: Fall Restrictions Weight Bearing Restrictions: No      Mobility  Bed Mobility Overal bed mobility: Independent                  Transfers Overall transfer level: Independent                      Ambulation/Gait Ambulation/Gait assistance: Independent Gait Distance (Feet): 300 Feet Assistive device: None            Stairs            Wheelchair Mobility     Tilt Bed    Modified Rankin (Stroke Patients Only)       Balance Overall balance assessment: Independent                                           Pertinent Vitals/Pain Pain  Assessment Pain Assessment: No/denies pain    Home Living Family/patient expects to be discharged to:: Private residence Living Arrangements: Spouse/significant other Available Help at Discharge: Neighbor;Available PRN/intermittently Type of Home: House Home Access: Stairs to enter   Entrance Stairs-Number of Steps: 1   Home Layout: One level        Prior Function Prior Level of Function : Independent/Modified Independent;Driving                     Extremity/Trunk Assessment   Upper Extremity Assessment Upper Extremity Assessment: Overall WFL for tasks assessed    Lower Extremity Assessment Lower Extremity Assessment: Overall WFL for tasks assessed    Cervical / Trunk Assessment Cervical / Trunk Assessment: Normal  Communication      Cognition Arousal: Alert Behavior During Therapy: WFL for tasks assessed/performed Overall Cognitive Status: Within Functional Limits for tasks assessed                                          General Comments      Exercises  Assessment/Plan    PT Assessment Patient does not need any further PT services  PT Problem List         PT Treatment Interventions      PT Goals (Current goals can be found in the Care Plan section)       Frequency       Co-evaluation               AM-PAC PT "6 Clicks" Mobility  Outcome Measure Help needed turning from your back to your side while in a flat bed without using bedrails?: None Help needed moving from lying on your back to sitting on the side of a flat bed without using bedrails?: None Help needed moving to and from a bed to a chair (including a wheelchair)?: None Help needed standing up from a chair using your arms (e.g., wheelchair or bedside chair)?: None Help needed to walk in hospital room?: None Help needed climbing 3-5 steps with a railing? : None 6 Click Score: 24    End of Session   Activity Tolerance: Patient tolerated treatment  well Patient left: in bed;with family/visitor present Nurse Communication: Mobility status PT Visit Diagnosis: Other abnormalities of gait and mobility (R26.89)    Time: 9629-5284 PT Time Calculation (min) (ACUTE ONLY): 8 min   Charges:   PT Evaluation $PT Eval Low Complexity: 1 Low   PT General Charges $$ ACUTE PT VISIT: 1 Visit         Olga Coaster PT, DPT 10:44 AM,01/13/23

## 2023-01-13 NOTE — Progress Notes (Signed)
OT Cancellation Note  Patient Details Name: Aaron Knox MRN: [id number] DOB: 11/17/1955   Cancelled Treatment:    Reason Eval/Treat Not Completed: OT screened, no needs identified, will sign off. Per observation of pt ambulating IND with PT ~300+ ft in hallway and conversation with treating PT, pt is IND and at his functional baseline. No skilled OT needs identified. OT to complete orders and sign off.   Jessiah Wojnar L. Iasia Forcier, OTR/L  01/13/23, 11:34 AM

## 2023-01-14 ENCOUNTER — Emergency Department
Admission: EM | Admit: 2023-01-14 | Discharge: 2023-01-14 | Disposition: A | Discharge status: Home / Self Care | Payer: Medicare PPO | Attending: Emergency Medicine | Admitting: Emergency Medicine

## 2023-01-14 ENCOUNTER — Other Ambulatory Visit: Payer: Self-pay

## 2023-01-14 ENCOUNTER — Encounter: Payer: Self-pay | Admitting: Intensive Care

## 2023-01-14 DIAGNOSIS — Z7901 Long term (current) use of anticoagulants: Secondary | ICD-10-CM | POA: Insufficient documentation

## 2023-01-14 DIAGNOSIS — K2900 Acute gastritis without bleeding: Secondary | ICD-10-CM | POA: Diagnosis not present

## 2023-01-14 DIAGNOSIS — R1110 Vomiting, unspecified: Secondary | ICD-10-CM | POA: Diagnosis present

## 2023-01-14 MED ORDER — ALUM & MAG HYDROXIDE-SIMETH 200-200-20 MG/5ML PO SUSP
30.0000 mL | Freq: Once | ORAL | Status: AC
  Administered 2023-01-14: 30 mL via ORAL
  Filled 2023-01-14: qty 30

## 2023-01-14 MED ORDER — METOCLOPRAMIDE HCL 10 MG PO TABS: 10.0000 mg | ORAL_TABLET | Freq: Four times a day (QID) | ORAL | 0 refills | Status: DC | PRN

## 2023-01-14 MED ORDER — ONDANSETRON 4 MG PO TBDP
4.0000 mg | ORAL_TABLET | Freq: Once | ORAL | Status: AC
  Administered 2023-01-14: 4 mg via ORAL
  Filled 2023-01-14: qty 1

## 2023-01-14 MED ORDER — FAMOTIDINE 20 MG PO TABS
40.0000 mg | ORAL_TABLET | Freq: Once | ORAL | Status: AC
  Administered 2023-01-14: 40 mg via ORAL
  Filled 2023-01-14: qty 2

## 2023-01-14 MED ORDER — FAMOTIDINE 20 MG PO TABS: 20.0000 mg | ORAL_TABLET | Freq: Two times a day (BID) | ORAL | 0 refills | Status: DC

## 2023-01-14 NOTE — ED Provider Notes (Signed)
[facility] Provider Note    Event Date/Time   First MD Initiated Contact with Patient 01/14/23 0720     (approximate)   History   Chief Complaint: Medication Reaction   HPI  Aaron Knox is a 67 y.o. male who comes ED complaining of upset stomach that started immediately after taking his Eliquis this morning, 20 to 30 minutes later he had an episode of vomiting.  No abdominal pain, no fever chills chest pain shortness of breath rash or itchiness.  No hematemesis.  No black or bloody stool.  Reviewed outside records noting patient was recently hospitalized with ischemic limb symptoms, was started on heparin, underwent angiography and thrombectomy by vascular surgery, discharged from the hospital yesterday.     Physical Exam   Triage Vital Signs: ED Triage Vitals  Encounter Vitals Group     BP 01/14/23 0716 (!) 151/77     Systolic BP Percentile --      Diastolic BP Percentile --      Pulse Rate 01/14/23 0716 (!) 56     Resp 01/14/23 0716 16     Temp 01/14/23 0717 97.6 F (36.4 C)     Temp Source 01/14/23 0717 Oral     SpO2 01/14/23 0716 100 %     Weight 01/14/23 0717 189 lb 9.5 oz (86 kg)     Height 01/14/23 0717 6\' 2"  (1.88 m)     Head Circumference --      Peak Flow --      Pain Score 01/14/23 0717 0     Pain Loc --      Pain Education --      Exclude from Growth Chart --     Most recent vital signs: Vitals:   01/14/23 0716 01/14/23 0717  BP: (!) 151/77   Pulse: (!) 56   Resp: 16   Temp:  97.6 F (36.4 C)  SpO2: 100%     General: Awake, no distress.  CV:  Good peripheral perfusion.  Resp:  Normal effort.  Abd:  No distention.  Soft nontender    ED Results / Procedures / Treatments   Labs (all labs ordered are listed, but only abnormal results are displayed) Labs Reviewed - No data to display   EKG    RADIOLOGY    PROCEDURES:  Procedures   MEDICATIONS ORDERED IN ED: Medications  alum & mag  hydroxide-simeth (MAALOX/MYLANTA) 200-200-20 MG/5ML suspension 30 mL (30 mLs Oral Given 01/14/23 0806)  famotidine (PEPCID) tablet 40 mg (40 mg Oral Given 01/14/23 0805)  ondansetron (ZOFRAN-ODT) disintegrating tablet 4 mg (4 mg Oral Given 01/14/23 0806)     IMPRESSION / MDM / ASSESSMENT AND PLAN / ED COURSE  I reviewed the triage vital signs and the nursing notes.    Patient's presentation is most consistent with acute illness / injury with system symptoms.  Patient presents to the ED with stomach upset after taking his medication with water this morning.  No signs of allergic reaction or GI bleeding.  Suspect this is GERD/gastritis related to his recent illness, undergoing sedation and invasive procedure, and period of n.p.o.   Considering the patient's symptoms, medical history, and physical examination today, I have low suspicion for cholecystitis or biliary pathology, pancreatitis, perforation or bowel obstruction, hernia, intra-abdominal abscess, AAA or dissection, volvulus or intussusception, mesenteric ischemia, or appendicitis.  Doubt ACS.  Patient is sipping on a [facility].  Will give antacids and p.o. trial.   ----------------------------------------- 8:43  AM on 01/14/2023 ----------------------------------------- Tolerating oral intake, symptoms resolved.  Stable for discharge       FINAL CLINICAL IMPRESSION(S) / ED DIAGNOSES   Final diagnoses:  Acute gastritis without hemorrhage, unspecified gastritis type     Rx / DC Orders   ED Discharge Orders          Ordered    metoCLOPramide (REGLAN) 10 MG tablet  Every 6 hours PRN        01/14/23 0843    famotidine (PEPCID) 20 MG tablet  2 times daily        01/14/23 1191             Note:  This document was prepared using Dragon voice recognition software and may include unintentional dictation errors.   Sharman Cheek, MD 01/14/23 [phone]

## 2023-01-14 NOTE — ED Triage Notes (Signed)
Patient reports he was prescribed Eliquis here in ER yesterday and once taking it made him sick to his stomach and feel lightheaded.

## 2023-01-15 ENCOUNTER — Other Ambulatory Visit (INDEPENDENT_AMBULATORY_CARE_PROVIDER_SITE_OTHER): Payer: Self-pay | Admitting: Nurse Practitioner

## 2023-01-15 ENCOUNTER — Ambulatory Visit (INDEPENDENT_AMBULATORY_CARE_PROVIDER_SITE_OTHER): Payer: Medicare PPO

## 2023-01-15 ENCOUNTER — Ambulatory Visit (INDEPENDENT_AMBULATORY_CARE_PROVIDER_SITE_OTHER): Payer: Medicare PPO | Admitting: Nurse Practitioner

## 2023-01-15 ENCOUNTER — Encounter: Payer: Self-pay | Admitting: Vascular Surgery

## 2023-01-15 VITALS — BP 128/73 | HR 61 | Resp 16 | Wt 192.2 lb

## 2023-01-15 DIAGNOSIS — Z9889 Other specified postprocedural states: Secondary | ICD-10-CM

## 2023-01-15 DIAGNOSIS — I723 Aneurysm of iliac artery: Secondary | ICD-10-CM | POA: Diagnosis not present

## 2023-01-15 DIAGNOSIS — M7989 Other specified soft tissue disorders: Secondary | ICD-10-CM

## 2023-01-15 DIAGNOSIS — I739 Peripheral vascular disease, unspecified: Secondary | ICD-10-CM

## 2023-01-15 MED ORDER — ONDANSETRON HCL 4 MG PO TABS: 4.0000 mg | ORAL_TABLET | Freq: Three times a day (TID) | ORAL | 0 refills | Status: DC | PRN

## 2023-01-15 MED ORDER — APIXABAN 5 MG PO TABS: 5.0000 mg | ORAL_TABLET | Freq: Two times a day (BID) | ORAL | 6 refills | Status: DC

## 2023-01-15 NOTE — Progress Notes (Signed)
Subjective:    Patient ID: Aaron Knox, male    DOB: Mar 12, 1956, 67 y.o.   MRN: [id number] Chief Complaint  Patient presents with   Follow-up    Ultrasound follow up    The patient is a 67 year old male who presents today for follow-up regarding swelling in his right lower extremity.  However following the initiation of this appointment the patient was hospitalized on 01/12/2023 due to occlusion of his right iliac artery.  He notes that he was having earlier is improved but he has been having pain ongoing in his legs that is worse at night.  It is better if he hangs his lateral off the bed or if he gets up out of bed.  He notes that the swelling he had previously is actually somewhat better.  He has a seroma in his right groin that seems to be going smaller in size.  Today noninvasive studies show no evidence of DVT or superficial phlebitis in the right lower extremity.  No evidence of deep venous insufficiency but there is superficial venous reflux in the great saphenous vein.  Additionally the patient had ABIs done with a right ABI of 1.11 and a left of 1.06.  He had biphasic tibial artery waveforms bilaterally.  Additional imaging of the right lower extremity through the common femoral artery at the tibial show biphasic waveforms.    Review of Systems  Cardiovascular:  Positive for leg swelling.  All other systems reviewed and are negative.      Objective:   Physical Exam Vitals reviewed.  HENT:     Head: Normocephalic.  Cardiovascular:     Rate and Rhythm: Normal rate.     Pulses:          Dorsalis pedis pulses are 1+ on the right side.       Posterior tibial pulses are 1+ on the right side.  Pulmonary:     Effort: Pulmonary effort is normal.  Skin:    General: Skin is warm and dry.  Neurological:     Mental Status: He is alert and oriented to person, place, and time.  Psychiatric:        Mood and Affect: Mood normal.        Behavior: Behavior normal.        Thought  Content: Thought content normal.     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)    CAD in native artery    a.) LHC/PCI 08/09/2014 (setting of STEMI): 30% mLAD, 99% pLCx (3.0 x 15 mm Resolute Integrity DES x 1 with 10% residual post-intervention stenosis), 45% mLCx, 20% pRCA, 40% dRCA); b.) LHC 01/29/2018: 30% mLAD, 45% mLCx, 50% pRCA, 80% dRCA --> med mgmt   DDD (degenerative disc disease), lumbar    Diastolic dysfunction    a.) TTE 08/11/2014 (s/p STEMI): EF 45-50%, inf HK, AoV sclerosis, G1DD; b.) TTE 02/28/2018: EF 55-60%, G1DD   History of bilateral cataract extraction 2023   HTN (hypertension)    Hyperlipidemia    Long term current use of antithrombotics/antiplatelets    a.) clopidogrel   ST elevation myocardial infarction (STEMI) of true posterior wall (HCC) 08/09/2014   a.) LHC/PCI 08/09/2014: 3.0 x 15 mm Resolute Integrity DES x 1 pLCx with 10%  residual post-intervention stenosis)   Unstable angina (HCC)     Social History   Socioeconomic History   Marital status: Single    Spouse name: Not on file   Number of children: 1   Years of education: Not on file   Highest education level: Not on file  Occupational History   Not on file  Tobacco Use   Smoking status: Former    Current packs/day: 0.00    Types: Cigarettes    Quit date: 08/2022    Years since quitting: 0.4   Smokeless tobacco: Never  Vaping Use   Vaping status: Never Used  Substance and Sexual Activity   Alcohol use: Yes    Alcohol/week: 3.0 standard drinks of alcohol    Types: 3 Glasses of wine per week    Comment: weekends   Drug use: No   Sexual activity: Not on file  Other Topics Concern   Not on file  Social History Narrative   Lives alone   Social Determinants of Health   Financial Resource Strain: Not on file  Food  Insecurity: No Food Insecurity (01/12/2023)   Hunger Vital Sign    Worried About Running Out of Food in the Last Year: Never true    Ran Out of Food in the Last Year: Never true  Transportation Needs: No Transportation Needs (01/12/2023)   PRAPARE - Administrator, Civil Service (Medical): No    Lack of Transportation (Non-Medical): No  Physical Activity: Not on file  Stress: Not on file  Social Connections: Not on file  Intimate Partner Violence: Not At Risk (01/12/2023)   Humiliation, Afraid, Rape, and Kick questionnaire    Fear of Current or Ex-Partner: No    Emotionally Abused: No    Physically Abused: No    Sexually Abused: No    Past Surgical History:  Procedure Laterality Date   CARDIAC CATHETERIZATION N/A 08/09/2014   Procedure: Left Heart Cath and Coronary Angiography;  Surgeon: Iran Ouch, MD;  Location: [facility];  Service: Cardiovascular;  Laterality: N/A;   CARDIAC CATHETERIZATION N/A 08/09/2014   Procedure: Coronary Stent Intervention;  Surgeon: Iran Ouch, MD;  Location: [facility];  Service: Cardiovascular;  Laterality: N/A;   CATARACT EXTRACTION W/ INTRAOCULAR LENS  IMPLANT, BILATERAL Bilateral 2023   CORONARY ANGIOPLASTY  08/09/2014   ENDARTERECTOMY FEMORAL Right 09/20/2022   Procedure: ENDARTERECTOMY FEMORAL;  Surgeon: Annice Needy, MD;  Location: [facility];  Service: Vascular;  Laterality: Right;   HAND SURGERY Left 1980   bone chip with nerve damage   INSERTION OF ILIAC STENT Right 09/20/2022   Procedure: INSERTION OF ILIAC STENT;  Surgeon: Annice Needy, MD;  Location: [facility];  Service: Vascular;  Laterality: Right;   LEFT HEART CATH AND CORONARY ANGIOGRAPHY N/A 01/29/2018   Procedure: LEFT HEART CATH AND CORONARY ANGIOGRAPHY;  Surgeon: Yvonne Kendall, MD;  Location: [facility];  Service: Cardiovascular;  Laterality: N/A;   LOWER EXTREMITY ANGIOGRAPHY Right 08/07/2022   Procedure: Lower Extremity Angiography;   Surgeon: Annice Needy, MD;  Location: [facility];  Service: Cardiovascular;  Laterality: Right;   LOWER EXTREMITY ANGIOGRAPHY Right 01/12/2023   Procedure: Lower Extremity Angiography;  Surgeon: Annice Needy, MD;  Location: [facility];  Service: Cardiovascular;  Laterality: Right;   nerve reconstruction Left 1980   hand    Family History  Problem Relation Age of Onset   CAD Maternal Grandfather  Allergies  Allergen Reactions   Aspirin Anaphylaxis   Penicillins Other (See Comments)    Childhood allergy per pt's mother. He does not recall any details of the reaction. Willing to try beta lactams.       Latest Ref Rng & Units 01/13/2023    4:24 AM 01/12/2023    2:13 AM 01/08/2023    4:13 PM  CBC  WBC 4.0 - 10.5 K/uL 4.7  4.8  5.1   Hemoglobin 13.0 - 17.0 g/dL 08.6  57.8  46.9   Hematocrit 39.0 - 52.0 % 33.2  40.8  38.9   Platelets 150 - 400 K/uL 137  164  203       CMP     Component Value Date/Time   NA 136 01/13/2023 0424   K 3.6 01/13/2023 0424   CL 105 01/13/2023 0424   CO2 24 01/13/2023 0424   GLUCOSE 102 (H) 01/13/2023 0424   BUN 11 01/13/2023 0424   CREATININE 0.88 01/13/2023 0424   CALCIUM 8.2 (L) 01/13/2023 0424   PROT 7.0 11/03/2022 1621   ALBUMIN 4.0 11/03/2022 1621   AST 33 11/03/2022 1621   ALT 24 11/03/2022 1621   ALKPHOS 100 11/03/2022 1621   BILITOT 0.9 11/03/2022 1621   GFRNONAA >60 01/13/2023 0424     VAS [place] ABI WITH/WO TBI  Result Date: 01/01/2023  LOWER EXTREMITY DOPPLER STUDY Patient Name:  Suhaas Agena [facility]  Date of Exam:   01/01/2023 Medical Rec #: [id number]           Accession #:    [id number] Date of Birth: 1956/01/01           Patient Gender: M Patient Age:   23 years Exam Location:  [facility] Procedure:      VAS [place] ABI WITH/WO TBI Referring Phys: Sheppard Plumber --------------------------------------------------------------------------------  Indications: Claudication, and peripheral artery disease. High Risk  Factors: Hyperlipidemia, past history of smoking, prior MI, coronary                    artery disease.  Vascular Interventions: 09/20/2022 Right femoral endarterectomy and right common                         iliac stent                          08/07/2022: Catheter placement into Right CIA from Left                         Femoral approach. Aortogram and Selective Right Lower                         Extremity Angiogram. Performing Technologist: Hardie Lora RVT  Examination Guidelines: A complete evaluation includes at minimum, Doppler waveform signals and systolic blood pressure reading at the level of bilateral brachial, anterior tibial, and posterior tibial arteries, when vessel segments are accessible. Bilateral testing is considered an integral part of a complete examination. Photoelectric Plethysmograph (PPG) waveforms and toe systolic pressure readings are included as required and additional duplex testing as needed. Limited examinations for reoccurring indications may be performed as noted.  ABI Findings: +---------+------------------+-----+---------+--------+ Right    Rt Pressure (mmHg)IndexWaveform Comment  +---------+------------------+-----+---------+--------+ Brachial 163                                      +---------+------------------+-----+---------+--------+  PTA      169               1.04 triphasic         +---------+------------------+-----+---------+--------+ DP       152               0.93 biphasic          +---------+------------------+-----+---------+--------+ Great Toe121               0.74                   +---------+------------------+-----+---------+--------+ +---------+------------------+-----+---------+-------+ Left     Lt Pressure (mmHg)IndexWaveform Comment +---------+------------------+-----+---------+-------+ Brachial 161                                     +---------+------------------+-----+---------+-------+ PTA      159                0.98 triphasic        +---------+------------------+-----+---------+-------+ DP       144               0.88 biphasic         +---------+------------------+-----+---------+-------+ Great Toe141               0.87                  +---------+------------------+-----+---------+-------+ +-------+-----------+-----------+------------+------------+ ABI/TBIToday's ABIToday's TBIPrevious ABIPrevious TBI +-------+-----------+-----------+------------+------------+ Right  1.04       0.74       0.94        0.89         +-------+-----------+-----------+------------+------------+ Left   0.98       0.87       0.93        0.82         +-------+-----------+-----------+------------+------------+ Bilateral ABIs appear essentially unchanged compared to prior study on 10/17/2022.  Summary: Right: Resting right ankle-brachial index is within normal range. The right toe-brachial index is normal. Left: Resting left ankle-brachial index is within normal range. The left toe-brachial index is normal. *See table(s) above for measurements and observations.  Electronically signed by Festus Barren MD on 01/01/2023 at 2:15:07 PM.    Final        Assessment & Plan:   1. Peripheral arterial disease with history of revascularization [facility]) Today stating indicated the recent intervention that was done and is currently patent.  No significant stenosis noted today.  Patient will follow-up in 4 weeks for reevaluation. 2. 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  Because of the next few weeks we will evaluate again to see if there is a large changes as well as discussed possibility of intervention.  3. Leg swelling The leg swelling is improved post his recent intervention.  Studies today do indicate that he has some evidence of venous insufficiency in his great saphenous vein.  While treatment of the great saphenous vein is a possibility, given his peripheral arterial  disease treatment of his saphenous vein may be decreased option for possible bypass in the future if necessary.  Current Outpatient Medications on File Prior to Visit  Medication Sig Dispense Refill   APIXABAN (ELIQUIS) VTE STARTER PACK (10MG  AND 5MG ) Take as directed on package: start with two-5mg  tablets twice daily for 7 days. On day 8, switch to one-5mg  tablet twice daily. 74 each 0  atorvastatin (LIPITOR) 80 MG tablet Take 1 tablet (80 mg total) by mouth daily at 6 PM. (Patient taking differently: Take 80 mg by mouth daily.) 90 tablet 3   clopidogrel (PLAVIX) 75 MG tablet Take 75 mg by mouth daily.     famotidine (PEPCID) 20 MG tablet Take 1 tablet (20 mg total) by mouth 2 (two) times daily. 60 tablet 0   metoCLOPramide (REGLAN) 10 MG tablet Take 1 tablet (10 mg total) by mouth every 6 (six) hours as needed. 30 tablet 0   oxyCODONE (OXY IR/ROXICODONE) 5 MG immediate release tablet Take 1 tablet (5 mg total) by mouth every 6 (six) hours as needed for severe pain. 20 tablet 0   No current facility-administered medications on file prior to visit.    There are no Patient Instructions on file for this visit. No follow-ups on file.   Georgiana Spinner, NP

## 2023-01-15 NOTE — Progress Notes (Incomplete)
Subjective:    Patient ID: Aaron Knox, male    DOB: 12/19/1955, 67 y.o.   MRN: [id number] Chief Complaint  Patient presents with  . Follow-up    Ultrasound follow up    HPI  Review of Systems     Objective:   Physical Exam  BP 128/73 (BP Location: Left Arm)   Pulse 61   Resp 16   Wt 192 lb 3.2 oz (87.2 kg)   BMI 24.68 kg/m   Past Medical History:  Diagnosis Date  . 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 CAD in native artery    a.) LHC/PCI 08/09/2014 (setting of STEMI): 30% mLAD, 99% pLCx (3.0 x 15 mm Resolute Integrity DES x 1 with 10% residual post-intervention stenosis), 45% mLCx, 20% pRCA, 40% dRCA); b.) LHC 01/29/2018: 30% mLAD, 45% mLCx, 50% pRCA, 80% dRCA --> med mgmt  . DDD (degenerative disc disease), lumbar   . Diastolic dysfunction    a.) TTE 08/11/2014 (s/p STEMI): EF 45-50%, inf HK, AoV sclerosis, G1DD; b.) TTE 02/28/2018: EF 55-60%, G1DD  . History of bilateral cataract extraction 2023  . HTN (hypertension)   . Hyperlipidemia   . Long term current use of antithrombotics/antiplatelets    a.) clopidogrel  . ST elevation myocardial infarction (STEMI) of true posterior wall (HCC) 08/09/2014   a.) LHC/PCI 08/09/2014: 3.0 x 15 mm Resolute Integrity DES x 1 pLCx with 10% residual post-intervention stenosis)  . Unstable angina [facility])     Social History   Socioeconomic History  . Marital status: Single    Spouse name: Not on file  . Number of children: 1  . Years of education: Not on file  . Highest education level: Not on file  Occupational History  . Not on file  Tobacco Use  . Smoking status: Former    Current packs/day: 0.00    Types: Cigarettes    Quit date: 08/2022    Years since quitting: 0.4  . Smokeless tobacco: Never  Vaping Use  . Vaping status: Never Used  Substance and Sexual Activity  . Alcohol use: Yes    Alcohol/week: 3.0 standard drinks of alcohol    Types: 3 Glasses of wine per week    Comment:  weekends  . Drug use: No  . Sexual activity: Not on file  Other Topics Concern  . Not on file  Social History Narrative   Lives alone   Social Determinants of Health   Financial Resource Strain: Not on file  Food Insecurity: No Food Insecurity (01/12/2023)   Hunger Vital Sign   . Worried About Programme researcher, broadcasting/film/video in the Last Year: Never true   . Ran Out of Food in the Last Year: Never true  Transportation Needs: No Transportation Needs (01/12/2023)   PRAPARE - Transportation   . Lack of Transportation (Medical): No   . Lack of Transportation (Non-Medical): No  Physical Activity: Not on file  Stress: Not on file  Social Connections: Not on file  Intimate Partner Violence: Not At Risk (01/12/2023)   Humiliation, Afraid, Rape, and Kick questionnaire   . Fear of Current or Ex-Partner: No   . Emotionally Abused: No   . Physically Abused: No   . Sexually Abused: No    Past Surgical History:  Procedure Laterality Date  . CARDIAC CATHETERIZATION N/A 08/09/2014   Procedure: Left Heart Cath and Coronary Angiography;  Surgeon: Iran Ouch, MD;  Location: [facility];  Service: Cardiovascular;  Laterality: N/A;  . CARDIAC CATHETERIZATION N/A 08/09/2014   Procedure: Coronary Stent Intervention;  Surgeon: Iran Ouch, MD;  Location: [facility];  Service: Cardiovascular;  Laterality: N/A;  . CATARACT EXTRACTION W/ INTRAOCULAR LENS  IMPLANT, BILATERAL Bilateral 2023  . CORONARY ANGIOPLASTY  08/09/2014  . ENDARTERECTOMY FEMORAL Right 09/20/2022   Procedure: ENDARTERECTOMY FEMORAL;  Surgeon: Annice Needy, MD;  Location: [facility];  Service: Vascular;  Laterality: Right;  . HAND SURGERY Left 1980   bone chip with nerve damage  . INSERTION OF ILIAC STENT Right 09/20/2022   Procedure: INSERTION OF ILIAC STENT;  Surgeon: Annice Needy, MD;  Location: [facility];  Service: Vascular;  Laterality: Right;  . LEFT HEART CATH AND CORONARY ANGIOGRAPHY N/A 01/29/2018   Procedure: LEFT  HEART CATH AND CORONARY ANGIOGRAPHY;  Surgeon: Yvonne Kendall, MD;  Location: [facility];  Service: Cardiovascular;  Laterality: N/A;  . LOWER EXTREMITY ANGIOGRAPHY Right 08/07/2022   Procedure: Lower Extremity Angiography;  Surgeon: Annice Needy, MD;  Location: [facility];  Service: Cardiovascular;  Laterality: Right;  . LOWER EXTREMITY ANGIOGRAPHY Right 01/12/2023   Procedure: Lower Extremity Angiography;  Surgeon: Annice Needy, MD;  Location: [facility];  Service: Cardiovascular;  Laterality: Right;  . nerve reconstruction Left 1980   hand    Family History  Problem Relation Age of Onset  . CAD Maternal Grandfather     Allergies  Allergen Reactions  . Aspirin Anaphylaxis  . Penicillins Other (See Comments)    Childhood allergy per pt's mother. He does not recall any details of the reaction. Willing to try beta lactams.       Latest Ref Rng & Units 01/13/2023    4:24 AM 01/12/2023    2:13 AM 01/08/2023    4:13 PM  CBC  WBC 4.0 - 10.5 K/uL 4.7  4.8  5.1   Hemoglobin 13.0 - 17.0 g/dL 16.1  09.6  04.5   Hematocrit 39.0 - 52.0 % 33.2  40.8  38.9   Platelets 150 - 400 K/uL 137  164  203       CMP     Component Value Date/Time   NA 136 01/13/2023 0424   K 3.6 01/13/2023 0424   CL 105 01/13/2023 0424   CO2 24 01/13/2023 0424   GLUCOSE 102 (H) 01/13/2023 0424   BUN 11 01/13/2023 0424   CREATININE 0.88 01/13/2023 0424   CALCIUM 8.2 (L) 01/13/2023 0424   PROT 7.0 11/03/2022 1621   ALBUMIN 4.0 11/03/2022 1621   AST 33 11/03/2022 1621   ALT 24 11/03/2022 1621   ALKPHOS 100 11/03/2022 1621   BILITOT 0.9 11/03/2022 1621   GFRNONAA >60 01/13/2023 0424     VAS [place] ABI WITH/WO TBI  Result Date: 01/01/2023  LOWER EXTREMITY DOPPLER STUDY Patient Name:  Aaron Knox [place]  Date of Exam:   01/01/2023 Medical Rec #: [id number]           Accession #:    [id number] Date of Birth: 04-16-1955           Patient Gender: M Patient Age:   56 years Exam Location:   [facility] Procedure:      VAS [place] ABI WITH/WO TBI Referring Phys: Sheppard Plumber --------------------------------------------------------------------------------  Indications: Claudication, and peripheral artery disease. High Risk Factors: Hyperlipidemia, past history of smoking, prior MI, coronary  artery disease.  Vascular Interventions: 09/20/2022 Right femoral endarterectomy and right common                         iliac stent                          08/07/2022: Catheter placement into Right CIA from Left                         Femoral approach. Aortogram and Selective Right Lower                         Extremity Angiogram. Performing Technologist: Hardie Lora RVT  Examination Guidelines: A complete evaluation includes at minimum, Doppler waveform signals and systolic blood pressure reading at the level of bilateral brachial, anterior tibial, and posterior tibial arteries, when vessel segments are accessible. Bilateral testing is considered an integral part of a complete examination. Photoelectric Plethysmograph (PPG) waveforms and toe systolic pressure readings are included as required and additional duplex testing as needed. Limited examinations for reoccurring indications may be performed as noted.  ABI Findings: +---------+------------------+-----+---------+--------+ Right    Rt Pressure (mmHg)IndexWaveform Comment  +---------+------------------+-----+---------+--------+ Brachial 163                                      +---------+------------------+-----+---------+--------+ PTA      169               1.04 triphasic         +---------+------------------+-----+---------+--------+ DP       152               0.93 biphasic          +---------+------------------+-----+---------+--------+ Great Toe121               0.74                   +---------+------------------+-----+---------+--------+ +---------+------------------+-----+---------+-------+  Left     Lt Pressure (mmHg)IndexWaveform Comment +---------+------------------+-----+---------+-------+ Brachial 161                                     +---------+------------------+-----+---------+-------+ PTA      159               0.98 triphasic        +---------+------------------+-----+---------+-------+ DP       144               0.88 biphasic         +---------+------------------+-----+---------+-------+ Great Toe141               0.87                  +---------+------------------+-----+---------+-------+ +-------+-----------+-----------+------------+------------+ ABI/TBIToday's ABIToday's TBIPrevious ABIPrevious TBI +-------+-----------+-----------+------------+------------+ Right  1.04       0.74       0.94        0.89         +-------+-----------+-----------+------------+------------+ Left   0.98       0.87       0.93        0.82         +-------+-----------+-----------+------------+------------+ Bilateral ABIs appear essentially unchanged compared to prior study on 10/17/2022.  Summary: Right: Resting right ankle-brachial index is within normal range. The right toe-brachial index is normal. Left: Resting left ankle-brachial index is within normal range. The left toe-brachial index is normal. *See table(s) above for measurements and observations.  Electronically signed by Festus Barren MD on 01/01/2023 at 2:15:07 PM.    Final        Assessment & Plan:   1. Peripheral arterial disease with history of revascularization (HCC) ***  2. Iliac artery aneurysm, left (HCC) ***  3. Leg swelling ***   Current Outpatient Medications on File Prior to Visit  Medication Sig Dispense Refill  . APIXABAN (ELIQUIS) VTE STARTER PACK (10MG  AND 5MG ) Take as directed on package: start with two-5mg  tablets twice daily for 7 days. On day 8, switch to one-5mg  tablet twice daily. 74 each 0  . atorvastatin (LIPITOR) 80 MG tablet Take 1 tablet (80 mg total) by mouth daily  at 6 PM. (Patient taking differently: Take 80 mg by mouth daily.) 90 tablet 3  . clopidogrel (PLAVIX) 75 MG tablet Take 75 mg by mouth daily.    . famotidine (PEPCID) 20 MG tablet Take 1 tablet (20 mg total) by mouth 2 (two) times daily. 60 tablet 0  . metoCLOPramide (REGLAN) 10 MG tablet Take 1 tablet (10 mg total) by mouth every 6 (six) hours as needed. 30 tablet 0  . oxyCODONE (OXY IR/ROXICODONE) 5 MG immediate release tablet Take 1 tablet (5 mg total) by mouth every 6 (six) hours as needed for severe pain. 20 tablet 0   No current facility-administered medications on file prior to visit.    There are no Patient Instructions on file for this visit. No follow-ups on file.   Georgiana Spinner, NP

## 2023-01-16 ENCOUNTER — Encounter (INDEPENDENT_AMBULATORY_CARE_PROVIDER_SITE_OTHER): Payer: Self-pay | Admitting: Nurse Practitioner

## 2023-01-17 LAB — VAS US ABI WITH/WO TBI
Left ABI: 1.06
Right ABI: 1.11

## 2023-02-22 ENCOUNTER — Other Ambulatory Visit (INDEPENDENT_AMBULATORY_CARE_PROVIDER_SITE_OTHER): Payer: Self-pay | Admitting: Nurse Practitioner

## 2023-02-22 DIAGNOSIS — I723 Aneurysm of iliac artery: Secondary | ICD-10-CM

## 2023-02-27 ENCOUNTER — Encounter (INDEPENDENT_AMBULATORY_CARE_PROVIDER_SITE_OTHER): Payer: Medicare PPO

## 2023-02-27 ENCOUNTER — Ambulatory Visit (INDEPENDENT_AMBULATORY_CARE_PROVIDER_SITE_OTHER): Payer: Medicare PPO | Admitting: Vascular Surgery

## 2023-02-27 ENCOUNTER — Ambulatory Visit (INDEPENDENT_AMBULATORY_CARE_PROVIDER_SITE_OTHER): Payer: Medicare PPO

## 2023-02-27 ENCOUNTER — Encounter (INDEPENDENT_AMBULATORY_CARE_PROVIDER_SITE_OTHER): Payer: Self-pay | Admitting: Vascular Surgery

## 2023-02-27 VITALS — BP 137/72 | HR 81 | Resp 16 | Wt 194.6 lb

## 2023-02-27 DIAGNOSIS — I723 Aneurysm of iliac artery: Secondary | ICD-10-CM

## 2023-02-27 DIAGNOSIS — I10 Essential (primary) hypertension: Secondary | ICD-10-CM | POA: Diagnosis not present

## 2023-02-27 DIAGNOSIS — E782 Mixed hyperlipidemia: Secondary | ICD-10-CM | POA: Diagnosis not present

## 2023-02-27 DIAGNOSIS — I70221 Atherosclerosis of native arteries of extremities with rest pain, right leg: Secondary | ICD-10-CM | POA: Diagnosis not present

## 2023-02-27 NOTE — Assessment & Plan Note (Signed)
lipid control important in reducing the progression of atherosclerotic disease. Continue statin therapy  

## 2023-02-27 NOTE — Assessment & Plan Note (Addendum)
His duplex today show his right iliac stents to be widely patent.  The aorta is normal.  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  Overall doing well.  Continue current medical regimen.  We will follow-up in 6 months with ABIs and aortoiliac duplex.

## 2023-02-27 NOTE — Assessment & Plan Note (Signed)
His duplex today show his right iliac stents to be widely patent.  The aorta is normal.  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

## 2023-02-27 NOTE — Assessment & Plan Note (Signed)
blood pressure control important in reducing the progression of atherosclerotic disease. On appropriate oral medications.  

## 2023-02-27 NOTE — Progress Notes (Signed)
MRN : [id number]  Aaron Knox is a 67 y.o. (1955-05-02) male who presents with chief complaint of  Chief Complaint  Patient presents with   Follow-up    6 weeks ultrasound follow up  .  History of Present Illness: Patient returns today in follow up of his right lower extremity ischemia.  He underwent surgical therapy earlier this year and then about 6 weeks ago had iliac intervention for recurrent occlusions.  He is doing quite well.  His groin seroma has resolved.  His leg feels better than it has in many years and he is being very active.  He says he was out mowing and weed eating for several hours a day this weekend.  His duplex today show his right iliac stents to be widely patent.  The aorta is normal.  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  Current Outpatient Medications  Medication Sig Dispense Refill   apixaban (ELIQUIS) 5 MG TABS tablet Take 1 tablet (5 mg total) by mouth 2 (two) times daily. 60 tablet 6   APIXABAN (ELIQUIS) VTE STARTER PACK (10MG  AND 5MG ) Take as directed on package: start with two-5mg  tablets twice daily for 7 days. On day 8, switch to one-5mg  tablet twice daily. 74 each 0   atorvastatin (LIPITOR) 80 MG tablet Take 1 tablet (80 mg total) by mouth daily at 6 PM. (Patient taking differently: Take 80 mg by mouth daily.) 90 tablet 3   clopidogrel (PLAVIX) 75 MG tablet Take 75 mg by mouth daily.     famotidine (PEPCID) 20 MG tablet Take 1 tablet (20 mg total) by mouth 2 (two) times daily. 60 tablet 0   metoCLOPramide (REGLAN) 10 MG tablet Take 1 tablet (10 mg total) by mouth every 6 (six) hours as needed. 30 tablet 0   ondansetron (ZOFRAN) 4 MG tablet Take 1 tablet (4 mg total) by mouth every 8 (eight) hours as needed for nausea or vomiting. 20 tablet 0   oxyCODONE (OXY IR/ROXICODONE) 5 MG immediate release tablet Take 1 tablet (5 mg total) by mouth every 6 (six) hours as needed for severe pain. 20 tablet 0   No  current facility-administered medications for this visit.    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)    CAD in native artery    a.) LHC/PCI 08/09/2014 (setting of STEMI): 30% mLAD, 99% pLCx (3.0 x 15 mm Resolute Integrity DES x 1 with 10% residual post-intervention stenosis), 45% mLCx, 20% pRCA, 40% dRCA); b.) LHC 01/29/2018: 30% mLAD, 45% mLCx, 50% pRCA, 80% dRCA --> med mgmt   DDD (degenerative disc disease), lumbar    Diastolic dysfunction    a.) TTE 08/11/2014 (s/p STEMI): EF 45-50%, inf HK, AoV sclerosis, G1DD; b.) TTE 02/28/2018: EF 55-60%, G1DD   History of bilateral cataract extraction 2023   HTN (hypertension)    Hyperlipidemia    Long term current use of antithrombotics/antiplatelets    a.) clopidogrel   ST elevation myocardial infarction (STEMI) of true posterior wall (HCC) 08/09/2014   a.) LHC/PCI 08/09/2014: 3.0 x 15 mm Resolute Integrity DES x 1 pLCx with 10% residual post-intervention stenosis)   Unstable angina [facility])     Past Surgical History:  Procedure Laterality Date   CARDIAC CATHETERIZATION N/A 08/09/2014   Procedure: Left Heart Cath and Coronary Angiography;  Surgeon: Iran Ouch, MD;  Location: [facility];  Service: Cardiovascular;  Laterality: N/A;   CARDIAC CATHETERIZATION N/A 08/09/2014   Procedure: Coronary Stent Intervention;  Surgeon: Iran Ouch, MD;  Location: [facility];  Service: Cardiovascular;  Laterality: N/A;   CATARACT EXTRACTION W/ INTRAOCULAR LENS  IMPLANT, BILATERAL Bilateral 2023   CORONARY ANGIOPLASTY  08/09/2014   ENDARTERECTOMY FEMORAL Right 09/20/2022   Procedure: ENDARTERECTOMY FEMORAL;  Surgeon: Annice Needy, MD;  Location: [facility] ORS;  Service: Vascular;  Laterality: Right;   HAND SURGERY Left 1980   bone chip with nerve damage   INSERTION OF ILIAC STENT Right 09/20/2022   Procedure: INSERTION OF ILIAC STENT;  Surgeon: Annice Needy, MD;  Location: [facility]  ORS;  Service: Vascular;  Laterality: Right;   LEFT HEART CATH AND CORONARY ANGIOGRAPHY N/A 01/29/2018   Procedure: LEFT HEART CATH AND CORONARY ANGIOGRAPHY;  Surgeon: Yvonne Kendall, MD;  Location: [facility] INV[facility] CV LAB;  Service: Cardiovascular;  Laterality: N/A;   LOWER EXTREMITY ANGIOGRAPHY Right 08/07/2022   Procedure: Lower Extremity Angiography;  Surgeon: Annice Needy, MD;  Location: [facility] INV[facility] CV [facility];  Service: Cardiovascular;  Laterality: Right;   LOWER EXTREMITY ANGIOGRAPHY Right 01/12/2023   Procedure: Lower Extremity Angiography;  Surgeon: Annice Needy, MD;  Location: [facility] INVASIVE CV LAB;  Service: Cardiovascular;  Laterality: Right;   nerve reconstruction Left 1980   hand     Social History   Tobacco Use   Smoking status: Former    Current packs/day: 0.00    Types: Cigarettes    Quit date: 08/2022    Years since quitting: 0.5   Smokeless tobacco: Never  Vaping Use   Vaping status: Never Used  Substance Use Topics   Alcohol use: Yes    Alcohol/week: 3.0 standard drinks of alcohol    Types: 3 Glasses of wine per week    Comment: weekends   Drug use: No      Family History  Problem Relation Age of Onset   CAD Maternal Grandfather      Allergies  Allergen Reactions   Aspirin Anaphylaxis   Penicillins Other (See Comments)    Childhood allergy per pt's mother. He does not recall any details of the reaction. Willing to try beta lactams.     REVIEW OF SYSTEMS (Negative unless checked)   Constitutional: [] Weight loss  [] Fever  [] Chills Cardiac: [] Chest pain   [] Chest pressure   [] Palpitations   [] Shortness of breath when laying flat   [] Shortness of breath at rest   [] Shortness of breath with exertion. Vascular:  [x] Pain in legs with walking   [] Pain in legs at rest   [] Pain in legs when laying flat   [x] Claudication   [] Pain in feet when walking  [] Pain in feet at rest  [x] Pain in feet when laying flat   [] History of DVT   [] Phlebitis   [] Swelling in legs    [] Varicose veins   [] Non-healing ulcers Pulmonary:   [] Uses home oxygen   [] Productive cough   [] Hemoptysis   [] Wheeze  [] COPD   [] Asthma Neurologic:  [] Dizziness  [] Blackouts   [] Seizures   [] History of stroke   [] History of TIA  [] Aphasia   [] Temporary blindness   [] Dysphagia   [] Weakness or numbness in arms   [] Weakness or numbness in legs Musculoskeletal:  [] Arthritis   [] Joint swelling   [] Joint pain   [] Low back pain Hematologic:  [] Easy bruising  [] Easy bleeding   [] Hypercoagulable state   [] Anemic   Gastrointestinal:  [] Blood in stool   []   Vomiting blood  [] Gastroesophageal reflux/heartburn   [] Abdominal pain Genitourinary:  [] Chronic kidney disease   [] Difficult urination  [] Frequent urination  [] Burning with urination   [] Hematuria Skin:  [] Rashes   [] Ulcers   [] Wounds Psychological:  [] History of anxiety   []  History of major depression.  Physical Examination  BP 137/72 (BP Location: Right Arm)   Pulse 81   Resp 16   Wt 194 lb 9.6 oz (88.3 kg)   BMI 24.99 kg/m  Gen:  WD/WN, NAD. Appears younger than stated age. Head: [place]/AT, No temporalis wasting. Ear/Nose/Throat: Hearing grossly intact, nares w/o erythema or drainage Eyes: Conjunctiva clear. Sclera non-icteric Neck: Supple.  Trachea midline Pulmonary:  Good air movement, no use of accessory muscles.  Cardiac: RRR, no JVD Vascular:  Vessel Right Left  Radial Palpable Palpable                          PT Palpable Palpable  DP Palpable Palpable   Gastrointestinal: soft, non-tender/non-distended. No guarding/reflex.  Musculoskeletal: M/S 5/5 throughout.  No deformity or atrophy. Trace RLE edema. Neurologic: Sensation grossly intact in extremities.  Symmetrical.  Speech is fluent.  Psychiatric: Judgment intact, Mood & affect appropriate for pt's clinical situation. Dermatologic: No rashes or ulcers noted.  No cellulitis or open wounds.      Labs Recent Results (from the past 2160 hour(s))  VAS [place] ABI WITH/WO TBI      Status: None   Collection Time: 01/01/23 10:29 AM  Result Value Ref Range   Right ABI 1.04    Left ABI 0.98   CBC     Status: Abnormal   Collection Time: 01/01/23 12:07 PM  Result Value Ref Range   WBC 3.6 (L) 4.0 - 10.5 K/uL   RBC 4.23 4.22 - 5.81 MIL/uL   Hemoglobin 12.8 (L) 13.0 - 17.0 g/dL   HCT 47.8 (L) 29.5 - 62.1 %   MCV 90.8 80.0 - 100.0 fL   MCH 30.3 26.0 - 34.0 pg   MCHC 33.3 30.0 - 36.0 g/dL   RDW 30.8 65.7 - 84.6 %   Platelets 175 150 - 400 K/uL   nRBC 0.0 0.0 - 0.2 %    Comment: Performed at [facility], [address] [place], [place] [address]  Basic metabolic panel     Status: None   Collection Time: 01/01/23 12:07 PM  Result Value Ref Range   Sodium 138 135 - 145 mmol/L   Potassium 3.8 3.5 - 5.1 mmol/L   Chloride 104 98 - 111 mmol/L   CO2 27 22 - 32 mmol/L   Glucose, Bld 99 70 - 99 mg/dL    Comment: Glucose reference range applies only to samples taken after fasting for at least 8 hours.   BUN 11 8 - 23 mg/dL   Creatinine, Ser 2.84 0.61 - 1.24 mg/dL   Calcium 9.3 8.9 - 13.2 mg/dL   GFR, Estimated >44 >01 mL/min    Comment: (NOTE) Calculated using the CKD-EPI Creatinine Equation (2021)    Anion gap 7 5 - 15    Comment: Performed at [facility], [address] [place], [place] [address]  CBC     Status: Abnormal   Collection Time: 01/08/23  4:13 PM  Result Value Ref Range   WBC 5.1 4.0 - 10.5 K/uL   RBC 4.33 4.22 - 5.81 MIL/uL   Hemoglobin 13.2 13.0 - 17.0 g/dL   HCT 36.6 (L) 44.0 - 34.7 %  MCV 89.8 80.0 - 100.0 fL   MCH 30.5 26.0 - 34.0 pg   MCHC 33.9 30.0 - 36.0 g/dL   RDW 14.7 82.9 - 56.2 %   Platelets 203 150 - 400 K/uL   nRBC 0.0 0.0 - 0.2 %    Comment: Performed at [facility], [address] [place], [place] [address]  Basic metabolic panel     Status: Abnormal   Collection Time: 01/08/23  4:13 PM  Result Value Ref Range   Sodium 139 135 - 145 mmol/L   Potassium 3.3 (L) 3.5 - 5.1 mmol/L   Chloride  103 98 - 111 mmol/L   CO2 27 22 - 32 mmol/L   Glucose, Bld 122 (H) 70 - 99 mg/dL    Comment: Glucose reference range applies only to samples taken after fasting for at least 8 hours.   BUN 8 8 - 23 mg/dL   Creatinine, Ser 5.78 0.61 - 1.24 mg/dL   Calcium 9.4 8.9 - 46.9 mg/dL   GFR, Estimated >62 >95 mL/min    Comment: (NOTE) Calculated using the CKD-EPI Creatinine Equation (2021)    Anion gap 9 5 - 15    Comment: Performed at [facility], [address] [place], [place] [address]  Basic metabolic panel     Status: Abnormal   Collection Time: 01/12/23  2:13 AM  Result Value Ref Range   Sodium 139 135 - 145 mmol/L   Potassium 3.1 (L) 3.5 - 5.1 mmol/L   Chloride 103 98 - 111 mmol/L   CO2 25 22 - 32 mmol/L   Glucose, Bld 103 (H) 70 - 99 mg/dL    Comment: Glucose reference range applies only to samples taken after fasting for at least 8 hours.   BUN 11 8 - 23 mg/dL   Creatinine, Ser 2.44 0.61 - 1.24 mg/dL   Calcium 9.1 8.9 - 01.0 mg/dL   GFR, Estimated >27 >25 mL/min    Comment: (NOTE) Calculated using the CKD-EPI Creatinine Equation (2021)    Anion gap 11 5 - 15    Comment: Performed at [facility], [address] [place], [place] [address]  CBC with Differential     Status: None   Collection Time: 01/12/23  2:13 AM  Result Value Ref Range   WBC 4.8 4.0 - 10.5 K/uL   RBC 4.45 4.22 - 5.81 MIL/uL   Hemoglobin 13.6 13.0 - 17.0 g/dL   HCT 03.4 74.2 - 59.5 %   MCV 91.7 80.0 - 100.0 fL   MCH 30.6 26.0 - 34.0 pg   MCHC 33.3 30.0 - 36.0 g/dL   RDW 63.8 75.6 - 43.3 %   Platelets 164 150 - 400 K/uL   nRBC 0.0 0.0 - 0.2 %   Neutrophils Relative % 65 %   Neutro Abs 3.1 1.7 - 7.7 K/uL   Lymphocytes Relative 21 %   Lymphs Abs 1.0 0.7 - 4.0 K/uL   Monocytes Relative 10 %   Monocytes Absolute 0.5 0.1 - 1.0 K/uL   Eosinophils Relative 3 %   Eosinophils Absolute 0.2 0.0 - 0.5 K/uL   Basophils Relative 1 %   Basophils Absolute 0.0 0.0 - 0.1 K/uL   Immature  Granulocytes 0 %   Abs Immature Granulocytes 0.02 0.00 - 0.07 K/uL    Comment: Performed at [facility], [address], [place], [place] [address]  Magnesium     Status: None   Collection Time: 01/12/23  2:13 AM  Result Value Ref Range   Magnesium 2.2 1.7 - 2.4 mg/dL    Comment: Performed at [facility], [address] [place], [place] [address]  APTT     Status: None   Collection Time: 01/12/23  6:35 AM  Result Value Ref Range   aPTT 31 24 - 36 seconds    Comment: Performed at [facility], [address] [place], [place] [address]  Protime-INR     Status: None   Collection Time: 01/12/23  6:35 AM  Result Value Ref Range   Prothrombin Time 15.0 11.4 - 15.2 seconds   INR 1.2 0.8 - 1.2    Comment: (NOTE) INR goal varies based on device and disease states. Performed at [facility], [address] [place], [place] [address]   Heparin level (unfractionated)     Status: None   Collection Time: 01/12/23  9:04 PM  Result Value Ref Range   Heparin Unfractionated 0.50 0.30 - 0.70 IU/mL    Comment: (NOTE) The clinical reportable range upper limit is being lowered to >1.10 to align with the FDA approved guidance for the current laboratory assay.  If heparin results are below expected values, and patient dosage has  been confirmed, suggest follow up testing of antithrombin III levels. Performed at [facility], [address] [place], [place] [address]   CBC     Status: Abnormal   Collection Time: 01/13/23  4:24 AM  Result Value Ref Range   WBC 4.7 4.0 - 10.5 K/uL   RBC 3.67 (L) 4.22 - 5.81 MIL/uL   Hemoglobin 11.3 (L) 13.0 - 17.0 g/dL   HCT 29.5 (L) 62.1 - 30.8 %   MCV 90.5 80.0 - 100.0 fL   MCH 30.8 26.0 - 34.0 pg   MCHC 34.0 30.0 - 36.0 g/dL   RDW 65.7 84.6 - 96.2 %   Platelets 137 (L) 150 - 400 K/uL   nRBC 0.0 0.0 - 0.2 %    Comment: Performed at [facility], [address] [place], [place] [address]   Basic metabolic panel     Status: Abnormal   Collection Time: 01/13/23  4:24 AM  Result Value Ref Range   Sodium 136 135 - 145 mmol/L   Potassium 3.6 3.5 - 5.1 mmol/L   Chloride 105 98 - 111 mmol/L   CO2 24 22 - 32 mmol/L   Glucose, Bld 102 (H) 70 - 99 mg/dL    Comment: Glucose reference range applies only to samples taken after fasting for at least 8 hours.   BUN 11 8 - 23 mg/dL   Creatinine, Ser 1.32 0.61 - 1.24 mg/dL   Calcium 8.2 (L) 8.9 - 10.3 mg/dL   GFR, Estimated >44 >01 mL/min    Comment: (NOTE) Calculated using the CKD-EPI Creatinine Equation (2021)    Anion gap 7 5 - 15    Comment: Performed at [facility], [address] [place], [place] [address]  Heparin level (unfractionated)     Status: None   Collection Time: 01/13/23  4:24 AM  Result Value Ref Range   Heparin Unfractionated 0.55 0.30 - 0.70 IU/mL    Comment: (NOTE) The clinical reportable range upper limit is being lowered to >1.10 to align with the FDA approved guidance for the current laboratory assay.  If heparin results are below expected values, and patient dosage has  been confirmed, suggest follow up testing of antithrombin III levels. Performed at [facility], [address] [place], [place] [address]   VAS [place] ABI WITH/WO TBI     Status: None   Collection Time: 01/15/23 10:00 AM  Result Value Ref Range   Right ABI 1.11    Left ABI 1.06     Radiology No results found.  Assessment/Plan  Atherosclerosis of native arteries of extremity with rest pain (HCC) His duplex today show his right iliac stents to be widely patent.  The aorta is normal.  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  Overall doing well.  Continue current medical regimen.  We will follow-up in 6 months with ABIs and aortoiliac duplex.  HTN (hypertension) blood pressure control important in reducing the progression of atherosclerotic disease. On appropriate oral  medications.   Hyperlipidemia lipid control important in reducing the progression of atherosclerotic disease. Continue statin therapy   Iliac artery aneurysm (HCC) His duplex today show his right iliac stents to be widely patent.  The aorta is normal.  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    Aaron Barren, MD  02/27/2023 2:48 PM    This note was created with Dragon medical transcription system.  Any errors from dictation are purely unintentional

## 2023-08-06 ENCOUNTER — Encounter: Payer: Self-pay | Admitting: Medical

## 2023-08-06 ENCOUNTER — Ambulatory Visit: Attending: Medical | Admitting: Medical

## 2023-08-06 ENCOUNTER — Ambulatory Visit

## 2023-08-06 VITALS — BP 141/74 | HR 71 | Ht 74.0 in | Wt 198.4 lb

## 2023-08-06 DIAGNOSIS — R079 Chest pain, unspecified: Secondary | ICD-10-CM

## 2023-08-06 DIAGNOSIS — I739 Peripheral vascular disease, unspecified: Secondary | ICD-10-CM

## 2023-08-06 DIAGNOSIS — F17200 Nicotine dependence, unspecified, uncomplicated: Secondary | ICD-10-CM

## 2023-08-06 DIAGNOSIS — Z79899 Other long term (current) drug therapy: Secondary | ICD-10-CM

## 2023-08-06 DIAGNOSIS — R0600 Dyspnea, unspecified: Secondary | ICD-10-CM | POA: Diagnosis not present

## 2023-08-06 DIAGNOSIS — E782 Mixed hyperlipidemia: Secondary | ICD-10-CM

## 2023-08-06 DIAGNOSIS — I10 Essential (primary) hypertension: Secondary | ICD-10-CM

## 2023-08-06 DIAGNOSIS — R000 Tachycardia, unspecified: Secondary | ICD-10-CM

## 2023-08-06 DIAGNOSIS — I2510 Atherosclerotic heart disease of native coronary artery without angina pectoris: Secondary | ICD-10-CM | POA: Diagnosis not present

## 2023-08-06 MED ORDER — PANTOPRAZOLE SODIUM 20 MG PO TBEC: 20.0000 mg | DELAYED_RELEASE_TABLET | Freq: Every day | ORAL | 0 refills | Status: DC

## 2023-08-06 MED ORDER — METOPROLOL SUCCINATE ER 25 MG PO TB24: 12.5000 mg | ORAL_TABLET | Freq: Every day | ORAL | 3 refills | Status: DC

## 2023-08-06 MED ORDER — ATORVASTATIN CALCIUM 80 MG PO TABS: 80.0000 mg | ORAL_TABLET | Freq: Every day | ORAL | 3 refills | Status: AC

## 2023-08-06 NOTE — Patient Instructions (Addendum)
 Medication Instructions:  Your physician recommends the following medication changes.  START TAKING: Toprol  12.5 mg daily Protonix 20 mg daily  *If you need a refill on your cardiac medications before your next appointment, please call your pharmacy*  Lab Work: No labs ordered today  If you have labs (blood work) drawn today and your tests are completely normal, you will receive your results only by: MyChart Message (if you have MyChart) OR A paper copy in the mail If you have any lab test that is abnormal or we need to change your treatment, we will call you to review the results.  Testing/Procedures: Cardiac PET, ECHO, Heart monitor for 2 weeks  Your physician has requested that you have an echocardiogram. Echocardiography is a painless test that uses sound waves to create images of your heart. It provides your doctor with information about the size and shape of your heart and how well your heart's chambers and valves are working.   You may receive an ultrasound enhancing agent through an IV if needed to better visualize your heart during the echo. This procedure takes approximately one hour.  There are no restrictions for this procedure.  This will take place at [address] [facility], [place] [address]  Please note: We ask at that you not bring children with you during ultrasound (echo/ vascular) testing. Due to room size and safety concerns, children are not allowed in the ultrasound rooms during exams. Our front office staff cannot provide observation of children in our lobby area while testing is being conducted. An adult accompanying a patient to their appointment will only be allowed in the ultrasound room at the discretion of the ultrasound technician under special circumstances. We apologize for any inconvenience.     Please report to Radiology at the [facility] Main Entrance 30 minutes early for your test.  [address]  [place], [place] [address]                         OR   Please report to Radiology at [facility] Main Entrance, medical mall, 30 mins prior to your test.  [address]  [place], [place]  How to Prepare for Your Cardiac PET/CT Stress Test:  Nothing to eat or drink, except water, 3 hours prior to arrival time.  NO caffeine/decaffeinated products, or chocolate 12 hours prior to arrival. (Please note decaffeinated beverages (teas/coffees) still contain caffeine).  If you have caffeine within 12 hours prior, the test will need to be rescheduled.  You may take your remaining medications with water.  NO perfume, cologne or lotion on chest or abdomen area. FEMALES - Please avoid wearing dresses to this appointment.  Total time is 1 to 2 hours; you may want to bring reading material for the waiting time.  IF YOU THINK YOU MAY BE PREGNANT, OR ARE NURSING PLEASE INFORM THE TECHNOLOGIST.  In preparation for your appointment, medication and supplies will be purchased.  Appointment availability is limited, so if you need to cancel or reschedule, please call the Radiology Department Scheduler at [phone] 24 hours in advance to avoid a cancellation fee of $100.00  What to Expect When you Arrive:  Once you arrive and check in for your appointment, you will be taken to a preparation room within the Radiology Department.  A technologist or Nurse will obtain your medical history, verify that you are correctly prepped for the exam, and  explain the procedure.  Afterwards, an IV will be started in your arm and electrodes will be placed on your skin for EKG monitoring during the stress portion of the exam. Then you will be escorted to the PET/CT scanner.  There, staff will get you positioned on the scanner and obtain a blood pressure and EKG.  During the exam, you will continue to be connected to the EKG and blood pressure machines.  A small, safe amount of a radioactive tracer  will be injected in your IV to obtain a series of pictures of your heart along with an injection of a stress agent.    After your Exam:  It is recommended that you eat a meal and drink a caffeinated beverage to counter act any effects of the stress agent.  Drink plenty of fluids for the remainder of the day and urinate frequently for the first couple of hours after the exam.  Your doctor will inform you of your test results within 7-10 business days.  For more information and frequently asked questions, please visit our website: [email or web address]  For questions about your test or how to prepare for your test, please call: Cardiac Imaging Nurse Navigators Office: [phone]   ZIO XT- Long Term Monitor Instructions  Your physician has requested you wear a ZIO patch monitor for 14 days.  This is a single patch monitor. [employer] supplies one patch monitor per enrollment. Additional stickers are not available. Please do not apply patch if you will be having a Nuclear Stress Test,  Echocardiogram, Cardiac CT, MRI, or Chest Xray during the period you would be wearing the  monitor. The patch cannot be worn during these tests. You cannot remove and re-apply the  ZIO XT patch monitor.  Your ZIO patch monitor will be mailed 3 day USPS to your address on file. It may take 3-5 days  to receive your monitor after you have been enrolled.  Once you have received your monitor, please review the enclosed instructions. Your monitor  has already been registered assigning a specific monitor serial # to you.  Billing and Patient Assistance Program Information  We have supplied [employer] with any of your insurance information on file for billing purposes. [employer] offers a sliding scale Patient Assistance Program for patients that do not have  insurance, or whose insurance does not completely cover the cost of the ZIO monitor.  You must apply for the Patient Assistance Program to qualify for  this discounted rate.  To apply, please call [employer] at [phone], select option 4, select option 2, ask to apply for  Patient Assistance Program. Sanna Crystal will ask your household income, and how many people  are in your household. They will quote your out-of-pocket cost based on that information.  [employer] will also be able to set up a 71-month, interest-free payment plan if needed.  Applying the monitor   Shave hair from upper left chest.  Hold abrader disc by orange tab. Rub abrader in 40 strokes over the upper left chest as  indicated in your monitor instructions.  Clean area with 4 enclosed alcohol pads. Let dry.  Apply patch as indicated in monitor instructions. Patch will be placed under collarbone on left  side of chest with arrow pointing upward.  Rub patch adhesive wings for 2 minutes. Remove white label marked "1". Remove the white  label marked "2". Rub patch adhesive wings for 2 additional minutes.  While looking in a mirror, press and release button in center of patch.  A small green light will  flash 3-4 times. This will be your only indicator that the monitor has been turned on.  Do not shower for the first 24 hours. You may shower after the first 24 hours.  Press the button if you feel a symptom. You will hear a small click. Record Date, Time and  Symptom in the Patient Logbook.  When you are ready to remove the patch, follow instructions on the last 2 pages of Patient  Logbook. Stick patch monitor onto the last page of Patient Logbook.  Place Patient Logbook in the blue and white box. Use locking tab on box and tape box closed  securely. The blue and white box has prepaid postage on it. Please place it in the mailbox as  soon as possible. Your physician should have your test results approximately 7 days after the  monitor has been mailed back to [facility].  Call [facility] Customer Care at [phone] if you have questions regarding  your ZIO XT patch monitor.  Call them immediately if you see an orange light blinking on your  monitor.  If your monitor falls off in less than 4 days, contact our Monitor department at [phone].  If your monitor becomes loose or falls off after 4 days call [employer] at [phone] for  suggestions on securing your monitor   Follow-Up: At [facility], you and your health needs are our priority.  As part of our continuing mission to provide you with exceptional heart care, our providers are all part of one team.  This team includes your primary Cardiologist (physician) and Advanced Practice Providers or APPs (Physician Assistants and Nurse Practitioners) who all work together to provide you with the care you need, when you need it.  Your next appointment:   2 month(s)  Provider:   Timothy Gollan, MD or Cadence Furth, PA-C    We recommend signing up for the patient portal called "MyChart".  Sign up information is provided on this After Visit Summary.  MyChart is used to connect with patients for Virtual Visits (Telemedicine).  Patients are able to view lab/test results, encounter notes, upcoming appointments, etc.  Non-urgent messages can be sent to your provider as well.   To learn more about what you can do with MyChart, go to [email or web address].

## 2023-08-06 NOTE — Progress Notes (Signed)
 Cardiology Office Note:  .   Date:  08/06/2023  ID:  Aaron Knox, DOB Mar 08, 1956, MRN [id number] PCP: Patient, No Pcp Per  [facility] Providers Cardiologist:  Belva Boyden, MD     History of Present Illness: Aaron Knox is a 68 y.o. male  with a hx of coronary artery disease status post PCI/DES to proximal left circumflex, hyperlipidemia, tobacco use, allergy to aspirin , PAD s/p right femoral endarterectomy and stenting presents for overdue follow-up.   Patient has a history of CAD with STEMI in May 2016 with PCI and DES placement to the proximal left circumflex.  Cardiac cath in 2019 showed significant single-vessel CAD with 50% proximal and 90% distal RCA; RCA supplies relatively small PDA and PL branches.  Mild to moderate, nonobstructive CAD involving the left coronary artery with widely patent stent in the proximal left circumflex.  Medical therapy was recommended and Imdur  was added.  Refractory chest pain, can consider PCI to the RCA.  Echo showed LVEF 55 to 60%, grade 1 diastolic dysfunction.   The patient was last seen in May 2024 for preop evaluation prior to right femoral endarterectomy of right iliac stents. He was overall stable from a cardiac perspective. Patient reported he had been off cardiac medications due to cost issues.  He was restarted on Lipitor .   No further workup was pursued.  Today, the patient said he underwent vascular procedure and it went well. He was started on Eliquis  and Plavix  by vascular surgery. He denies bleeding issues. He has not been taking statin. He denies chest pain, he does have SOB. He reports indigestion, says this happened the last time he had a heart attack. He reports elevated HR last week, HR was 90-130s for 3-4 days. He says he felt tired.  He is smoking 4 cigarettes weekly.   Studies Reviewed: Aaron Knox   EKG Interpretation Date/Time:  Monday August 06 2023 10:53:59 EDT Ventricular Rate:  71 PR Interval:  152 QRS  Duration:  80 QT Interval:  396 QTC Calculation: 430 R Axis:   63  Text Interpretation: Normal sinus rhythm Nonspecific ST abnormality When compared with ECG of 28-Jan-2018 09:22, No significant change was found Confirmed by Gennaro Khat, Gladyse Corvin ([id number]) on 08/06/2023 11:01:28 AM    LHC 01/2018 Conclusions: Significant single-vessel coronary artery disease, with 50% proximal and 80% distal RCA stenoses; RCA supplies relatively small PDA and PL branches. Mild to moderate, non-obstructive CAD involving the left coronary artery with widely patent stent in the proximal LCx. Normal left ventricular filling pressure.   Recommendations: Medical therapy of single-vessel coronary artery disease.  I add isosorbide  mononitrate 30 mg daily and continue metoprolol  tartrate 25 mg twice daily.  If patient has recurrent chest pain, PCI to distal RCA could be considered (will need to discuss aspirin  desensitization versus alternative antiplatelet strategies given aspirin  allergy). Aggressive secondary prevention, including high-intensity statin therapy and smoking cessation. Monitor overnight.  If no further chest pain, likely discharge home tomorrow.   Recommend clopidogrel  75 mg daily for moderate to severe CAD and history of aspirin  allergy.   Sammy Crisp, MD [facility] Pager: [phone]   Coronary Diagrams   Diagnostic Dominance: Right        Echo 01/2018 Study Conclusions   - Left ventricle: The cavity size was normal. There was mild    concentric hypertrophy. Systolic function was normal. The    estimated ejection fraction was in the range of 55% to 60%. Wall  motion was normal; there were no regional wall motion    abnormalities. Doppler parameters are consistent with abnormal    left ventricular relaxation (grade 1 diastolic dysfunction).  - Pulmonary arteries: Systolic pressure could not be accurately    estimated.       Physical Exam:   VS:  BP (!) 141/74 (BP Location:  Left Arm, Patient Position: Sitting, Cuff Size: Normal)   Pulse 71   Ht 6\' 2"  (1.88 m)   Wt 198 lb 6.4 oz (90 kg)   SpO2 98%   BMI 25.47 kg/m    Wt Readings from Last 3 Encounters:  08/06/23 198 lb 6.4 oz (90 kg)  02/27/23 194 lb 9.6 oz (88.3 kg)  01/15/23 192 lb 3.2 oz (87.2 kg)    GEN: Well nourished, well developed in no acute distress NECK: No JVD; No carotid bruits CARDIAC: RRR, no murmurs, rubs, gallops RESPIRATORY:  Clear to auscultation without rales, wheezing or rhonchi  ABDOMEN: Soft, non-tender, non-distended EXTREMITIES:  No edema; No deformity   ASSESSMENT AND PLAN: .    CAD s/p PCI/DES pLCx Patient reports indigestion, which he felt prior to previous stenting. He says it is not severe. He has chronic SOB. I will start Protonix 20mg  daily. I will order a Cardiac PET stress test. He has an allergy to ASA. He was started on Eliquis  and Plavix  per Vascular surgery. Continue Plavix  75mg  daily, Eliquis  5mg  BID. He has not been taking statin, so I will refill this. I will start Toprol .   SOB I will update an echocardiogram. He is euvolemic. Smoking likely contributing.  HLD LDL 119. He has not been taking Lipitor , I will send in refills. Continue Lipitor  80mg  daily.   HTN BP is mildly elevated. I will start Toprol  12.5mg  daily as above.  Elevated HR He reports HR up to 130s last week. Says he felt tired. I will order a 2 week heart monitor. We can update labs at follow-up.  Tobacco use He is smoking 4 cigarettes weekly. I will refer to pulmonology. Cessation recommended.   PAD s/p right femoral endarterectomy and stenting He is followed by vascular surgery. Continue Eliquis  and Plavix .      Informed Consent   Shared Decision Making/Informed Consent The risks [chest pain, shortness of breath, cardiac arrhythmias, dizziness, blood pressure fluctuations, myocardial infarction, stroke/transient ischemic attack, nausea, vomiting, allergic reaction, radiation exposure,  metallic taste sensation and life-threatening complications (estimated to be 1 in 10,000)], benefits (risk stratification, diagnosing coronary artery disease, treatment guidance) and alternatives of a cardiac PET stress test were discussed in detail with Mr. Todorov and he agrees to proceed.     Dispo: Follow-up in 8 weeks  Signed, Ferdinando Lodge Rebekah Canada, PA-C

## 2023-08-28 ENCOUNTER — Encounter (INDEPENDENT_AMBULATORY_CARE_PROVIDER_SITE_OTHER): Payer: Self-pay | Admitting: Nurse Practitioner

## 2023-08-28 ENCOUNTER — Ambulatory Visit (INDEPENDENT_AMBULATORY_CARE_PROVIDER_SITE_OTHER): Payer: Medicare PPO

## 2023-08-28 ENCOUNTER — Ambulatory Visit (INDEPENDENT_AMBULATORY_CARE_PROVIDER_SITE_OTHER): Payer: Medicare PPO | Admitting: Nurse Practitioner

## 2023-08-28 VITALS — BP 156/82 | HR 62 | Resp 16 | Wt 196.4 lb

## 2023-08-28 DIAGNOSIS — Z9889 Other specified postprocedural states: Secondary | ICD-10-CM | POA: Diagnosis not present

## 2023-08-28 DIAGNOSIS — I10 Essential (primary) hypertension: Secondary | ICD-10-CM | POA: Diagnosis not present

## 2023-08-28 DIAGNOSIS — I723 Aneurysm of iliac artery: Secondary | ICD-10-CM

## 2023-08-28 DIAGNOSIS — I739 Peripheral vascular disease, unspecified: Secondary | ICD-10-CM

## 2023-08-28 DIAGNOSIS — I70221 Atherosclerosis of native arteries of extremities with rest pain, right leg: Secondary | ICD-10-CM | POA: Diagnosis not present

## 2023-08-29 ENCOUNTER — Encounter (INDEPENDENT_AMBULATORY_CARE_PROVIDER_SITE_OTHER): Payer: Self-pay | Admitting: Nurse Practitioner

## 2023-08-29 NOTE — Progress Notes (Signed)
 Subjective:    Patient ID: Aaron Knox, male    DOB: 04/29/1955, 68 y.o.   MRN: [id number] Chief Complaint  Patient presents with   Follow-up    6 month abi follow up    The patient is a 68 year old male who presents today for follow-up regarding right lower extremity ischemia.  He had surgical intervention last year and then had iliac intervention for recurrent occlusions several months ago.  He currently is doing very well with no complaints of swelling or claudication symptoms.  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  Today his duplex shows he has widely patent right iliac stents.  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  There is some noted calcification in the left common iliac artery but again is not having any significant symptoms.  He currently has an ABI of 1.14 on the right and 1.11 on the left with multiphasic waveforms bilaterally and good toe waveforms bilaterally. .    Review of Systems  All other systems reviewed and are negative.      Objective:   Physical Exam Vitals reviewed.  HENT:     Head: Normocephalic.  Cardiovascular:     Rate and Rhythm: Normal rate.     Pulses:          Dorsalis pedis pulses are 1+ on the right side.       Posterior tibial pulses are 1+ on the right side.  Pulmonary:     Effort: Pulmonary effort is normal.  Skin:    General: Skin is warm and dry.  Neurological:     Mental Status: He is alert and oriented to person, place, and time.  Psychiatric:        Mood and Affect: Mood normal.        Behavior: Behavior normal.        Thought Content: Thought content normal.     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)    CAD in native artery    a.) LHC/PCI 08/09/2014 (setting of STEMI):  30% mLAD, 99% pLCx (3.0 x 15 mm Resolute Integrity DES x 1 with 10% residual post-intervention stenosis), 45% mLCx, 20% pRCA, 40% dRCA); b.) LHC 01/29/2018: 30% mLAD, 45% mLCx, 50% pRCA, 80% dRCA --> med mgmt   DDD (degenerative disc disease), lumbar    Diastolic dysfunction    a.) TTE 08/11/2014 (s/p STEMI): EF 45-50%, inf HK, AoV sclerosis, G1DD; b.) TTE 02/28/2018: EF 55-60%, G1DD   History of bilateral cataract extraction 2023   HTN (hypertension)    Hyperlipidemia    Long term current use of antithrombotics/antiplatelets    a.) clopidogrel    ST elevation myocardial infarction (STEMI) of true posterior wall (HCC) 08/09/2014   a.) LHC/PCI 08/09/2014: 3.0 x 15 mm Resolute Integrity DES x 1 pLCx with 10% residual post-intervention stenosis)   Unstable angina (HCC)     Social History   Socioeconomic History   Marital status: Single    Spouse name: Not on file   Number of children: 1   Years of education: Not on file   Highest education level: Not on file  Occupational History   Not on file  Tobacco Use   Smoking status: Former    Current packs/day: 0.00    Types: Cigarettes    Quit date: 08/2022    Years since quitting: 1.0   Smokeless tobacco: Never  Vaping Use   Vaping status: Never Used  Substance and Sexual Activity   Alcohol use: Yes    Alcohol/week: 3.0 standard drinks of alcohol    Types: 3 Glasses of wine per week    Comment: weekends   Drug use: No   Sexual activity: Not on file  Other Topics Concern   Not on file  Social History Narrative   Lives alone   Social Drivers of Health   Financial Resource Strain: Not on file  Food Insecurity: No Food Insecurity (01/12/2023)   Hunger Vital Sign    Worried About Running Out of Food in the Last Year: Never true    Ran Out of Food in the Last Year: Never true  Transportation Needs: No Transportation Needs (01/12/2023)   PRAPARE - Administrator, Civil Service (Medical): No    Lack of Transportation  (Non-Medical): No  Physical Activity: Not on file  Stress: Not on file  Social Connections: Not on file  Intimate Partner Violence: Not At Risk (01/12/2023)   Humiliation, Afraid, Rape, and Kick questionnaire    Fear of Current or Ex-Partner: No    Emotionally Abused: No    Physically Abused: No    Sexually Abused: No    Past Surgical History:  Procedure Laterality Date   CARDIAC CATHETERIZATION N/A 08/09/2014   Procedure: Left Heart Cath and Coronary Angiography;  Surgeon: Wenona Hamilton, MD;  Location: [facility];  Service: Cardiovascular;  Laterality: N/A;   CARDIAC CATHETERIZATION N/A 08/09/2014   Procedure: Coronary Stent Intervention;  Surgeon: Wenona Hamilton, MD;  Location: [facility];  Service: Cardiovascular;  Laterality: N/A;   CATARACT EXTRACTION W/ INTRAOCULAR LENS  IMPLANT, BILATERAL Bilateral 2023   CORONARY ANGIOPLASTY  08/09/2014   ENDARTERECTOMY FEMORAL Right 09/20/2022   Procedure: ENDARTERECTOMY FEMORAL;  Surgeon: Celso College, MD;  Location: [facility];  Service: Vascular;  Laterality: Right;   HAND SURGERY Left 1980   bone chip with nerve damage   INSERTION OF ILIAC STENT Right 09/20/2022   Procedure: INSERTION OF ILIAC STENT;  Surgeon: Celso College, MD;  Location: [facility];  Service: Vascular;  Laterality: Right;   LEFT HEART CATH AND CORONARY ANGIOGRAPHY N/A 01/29/2018   Procedure: LEFT HEART CATH AND CORONARY ANGIOGRAPHY;  Surgeon: Sammy Crisp, MD;  Location: [facility];  Service: Cardiovascular;  Laterality: N/A;   LOWER EXTREMITY ANGIOGRAPHY Right 08/07/2022   Procedure: Lower Extremity Angiography;  Surgeon: Celso College, MD;  Location: [facility];  Service: Cardiovascular;  Laterality: Right;   LOWER EXTREMITY ANGIOGRAPHY Right 01/12/2023   Procedure: Lower Extremity Angiography;  Surgeon: Celso College, MD;  Location: [facility];  Service: Cardiovascular;  Laterality: Right;   nerve reconstruction Left 1980    hand    Family History  Problem Relation Age of Onset   CAD Maternal Grandfather     Allergies  Allergen Reactions   Aspirin  Anaphylaxis   Penicillins Other (See Comments)    Childhood allergy per pt's mother. He does not recall any details of the reaction. Willing to try beta lactams.       Latest Ref Rng & Units 01/13/2023    4:24 AM 01/12/2023    2:13 AM  01/08/2023    4:13 PM  CBC  WBC 4.0 - 10.5 K/uL 4.7  4.8  5.1   Hemoglobin 13.0 - 17.0 g/dL 14.7  82.9  56.2   Hematocrit 39.0 - 52.0 % 33.2  40.8  38.9   Platelets 150 - 400 K/uL 137  164  203       CMP     Component Value Date/Time   NA 136 01/13/2023 0424   K 3.6 01/13/2023 0424   CL 105 01/13/2023 0424   CO2 24 01/13/2023 0424   GLUCOSE 102 (H) 01/13/2023 0424   BUN 11 01/13/2023 0424   CREATININE 0.88 01/13/2023 0424   CALCIUM  8.2 (L) 01/13/2023 0424   PROT 7.0 11/03/2022 1621   ALBUMIN 4.0 11/03/2022 1621   AST 33 11/03/2022 1621   ALT 24 11/03/2022 1621   ALKPHOS 100 11/03/2022 1621   BILITOT 0.9 11/03/2022 1621   GFRNONAA >60 01/13/2023 0424     VAS US  ABI WITH/WO TBI  Result Date: 01/01/2023  LOWER EXTREMITY DOPPLER STUDY Patient Name:  Marshun Duva [facility]  Date of Exam:   01/01/2023 Medical Rec #: [id number]           Accession #:    [id number] Date of Birth: 13-Mar-1956           Patient Gender: M Patient Age:   62 years Exam Location:  [facility] Procedure:      VAS US  ABI WITH/WO TBI Referring Phys: Sharla Davis --------------------------------------------------------------------------------  Indications: Claudication, and peripheral artery disease. High Risk Factors: Hyperlipidemia, past history of smoking, prior MI, coronary                    artery disease.  Vascular Interventions: 09/20/2022 Right femoral endarterectomy and right common                         iliac stent                          08/07/2022: Catheter placement into Right CIA from Left                         Femoral  approach. Aortogram and Selective Right Lower                         Extremity Angiogram. Performing Technologist: Oneta Bilberry RVT  Examination Guidelines: A complete evaluation includes at minimum, Doppler waveform signals and systolic blood pressure reading at the level of bilateral brachial, anterior tibial, and posterior tibial arteries, when vessel segments are accessible. Bilateral testing is considered an integral part of a complete examination. Photoelectric Plethysmograph (PPG) waveforms and toe systolic pressure readings are included as required and additional duplex testing as needed. Limited examinations for reoccurring indications may be performed as noted.  ABI Findings: +---------+------------------+-----+---------+--------+ Right    Rt Pressure (mmHg)IndexWaveform Comment  +---------+------------------+-----+---------+--------+ Brachial 163                                      +---------+------------------+-----+---------+--------+ PTA      169               1.04 triphasic         +---------+------------------+-----+---------+--------+ DP       152  0.93 biphasic          +---------+------------------+-----+---------+--------+ Great Toe121               0.74                   +---------+------------------+-----+---------+--------+ +---------+------------------+-----+---------+-------+ Left     Lt Pressure (mmHg)IndexWaveform Comment +---------+------------------+-----+---------+-------+ Brachial 161                                     +---------+------------------+-----+---------+-------+ PTA      159               0.98 triphasic        +---------+------------------+-----+---------+-------+ DP       144               0.88 biphasic         +---------+------------------+-----+---------+-------+ Great Toe141               0.87                  +---------+------------------+-----+---------+-------+  +-------+-----------+-----------+------------+------------+ ABI/TBIToday's ABIToday's TBIPrevious ABIPrevious TBI +-------+-----------+-----------+------------+------------+ Right  1.04       0.74       0.94        0.89         +-------+-----------+-----------+------------+------------+ Left   0.98       0.87       0.93        0.82         +-------+-----------+-----------+------------+------------+ Bilateral ABIs appear essentially unchanged compared to prior study on 10/17/2022.  Summary: Right: Resting right ankle-brachial index is within normal range. The right toe-brachial index is normal. Left: Resting left ankle-brachial index is within normal range. The left toe-brachial index is normal. *See table(s) above for measurements and observations.  Electronically signed by Mikki Alexander MD on 01/01/2023 at 2:15:07 PM.    Final        Assessment & Plan:   1. Peripheral arterial disease with history of revascularization [facility]) The patient is currently doing very well.  No role for follow-up intervention at this time.  He also has not been on Eliquis  for at least the last 3 months due to changes in his insurance.  I think that at this time because he is on Plavix  and he has not had any issues in the last several months he can remain off Eliquis  without any significant issue.  Will have him return in 6 months or sooner if issues arise. 2. Iliac artery aneurysm, left [facility]) The patient does have a left iliac artery aneurysm.  Because of the next few weeks we will evaluate again to see if there is a large changes as well as discussed possibility of intervention.    3. Primary hypertension Continue antihypertensive medications as already ordered, these medications have been reviewed and there are no changes at this time.   Current Outpatient Medications on File Prior to Visit  Medication Sig Dispense Refill   apixaban  (ELIQUIS ) 5 MG TABS tablet Take 1 tablet (5 mg total) by mouth 2 (two)  times daily. 60 tablet 6   APIXABAN  (ELIQUIS ) VTE STARTER PACK (10MG  AND 5MG ) Take as directed on package: start with two-5mg  tablets twice daily for 7 days. On day 8, switch to one-5mg  tablet twice daily. 74 each 0   atorvastatin  (LIPITOR ) 80 MG tablet Take 1 tablet (80 mg total) by mouth  daily at 6 PM. 90 tablet 3   clopidogrel  (PLAVIX ) 75 MG tablet Take 75 mg by mouth daily.     famotidine  (PEPCID ) 20 MG tablet Take 1 tablet (20 mg total) by mouth 2 (two) times daily. 60 tablet 0   metoCLOPramide  (REGLAN ) 10 MG tablet Take 1 tablet (10 mg total) by mouth every 6 (six) hours as needed. 30 tablet 0   metoprolol  succinate (TOPROL  XL) 25 MG 24 hr tablet Take 0.5 tablets (12.5 mg total) by mouth daily. 45 tablet 3   ondansetron  (ZOFRAN ) 4 MG tablet Take 1 tablet (4 mg total) by mouth every 8 (eight) hours as needed for nausea or vomiting. 20 tablet 0   oxyCODONE  (OXY IR/ROXICODONE ) 5 MG immediate release tablet Take 1 tablet (5 mg total) by mouth every 6 (six) hours as needed for severe pain. 20 tablet 0   pantoprazole  (PROTONIX ) 20 MG tablet Take 1 tablet (20 mg total) by mouth daily. 90 tablet 0   No current facility-administered medications on file prior to visit.    There are no Patient Instructions on file for this visit. No follow-ups on file.   Jodene Polyak E Donnell Beauchamp, NP

## 2023-08-30 LAB — VAS US ABI WITH/WO TBI
Left ABI: 1.11
Right ABI: 1.14

## 2023-09-12 ENCOUNTER — Encounter: Payer: Self-pay | Admitting: Pulmonary Disease

## 2023-09-12 ENCOUNTER — Ambulatory Visit: Admitting: Pulmonary Disease

## 2023-09-12 VITALS — BP 140/80 | HR 68 | Temp 98.2°F | Ht 74.0 in | Wt 195.8 lb

## 2023-09-12 DIAGNOSIS — F17200 Nicotine dependence, unspecified, uncomplicated: Secondary | ICD-10-CM

## 2023-09-12 DIAGNOSIS — R0600 Dyspnea, unspecified: Secondary | ICD-10-CM | POA: Diagnosis not present

## 2023-09-12 MED ORDER — NICOTINE 7 MG/24HR TD PT24: 7.0000 mg | MEDICATED_PATCH | TRANSDERMAL | 0 refills | Status: AC

## 2023-09-12 MED ORDER — NICOTINE POLACRILEX 2 MG MT LOZG: 2.0000 mg | LOZENGE | OROMUCOSAL | 3 refills | Status: AC | PRN

## 2023-09-12 NOTE — Progress Notes (Signed)
 Synopsis: Referred in by Durward Gillis, PA-C   Subjective:   PATIENT ID: Aaron Knox GENDER: male DOB: Aug 26, 1955, MRN: [id number]  Chief Complaint  Patient presents with   New Patient (Initial Visit)    SOB/DOE, smokes 4 cigarettes a week.  SOB for about a month has noticed her had a high heart rate in the early mornings and late at night. Quit smoking fully last June of 2024, but occasionally still has a cigarette here and there, and currently vapes everyday. 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 Feels fine while mowing the lawn or weed eating.    HPI Aaron Knox is a pleasant 68 year old male patient with a past medical history of PAD status post right femoral artery endarterectomy with iliac stent placement in June 2024, hypertension, hyperlipidemia, CAD status post PCI DES to the proximal left circumflex presenting today as a referral from his cardiology team for evaluation of dyspnea.  He reports that about a month ago he had an episode of tachycardia with heart rate in the 130s.  He was referred to cardiology and currently is pending an echocardiogram and a nuclear perfusion scan.  He describes shortness of breath in the mornings and at night.  But surprisingly during the day on exertion he does not feel any shortness of breath.  He feels as if his chest is tight and not able to take a deep breath in.  He does report intermittent coughing spells when he vapes.  Family history -denies any family history of lung diseases.  He has 1 daughter who is healthy.  Social history -quit smoking in June 2024. vapes on a daily basis.  He smoked 5 cigarettes a day for 55 years.  He works in Aeronautical engineer.  Does not have any pets at home.   ROS All systems were reviewed and are negative except for the above.  Objective:   Vitals:   09/12/23 0844  BP: (!) 140/80  Pulse: 68   Temp: 98.2 F (36.8 C)  TempSrc: Oral  SpO2: 96%  Weight: 195 lb 12.8 oz (88.8 kg)  Height: 6\' 2"  (1.88 m)   96% on RA BMI Readings from Last 3 Encounters:  09/12/23 25.14 kg/m  08/28/23 25.22 kg/m  08/06/23 25.47 kg/m   Wt Readings from Last 3 Encounters:  09/12/23 195 lb 12.8 oz (88.8 kg)  08/28/23 196 lb 6.4 oz (89.1 kg)  08/06/23 198 lb 6.4 oz (90 kg)    Physical Exam GEN: NAD, Healthy Appearing HEENT: Supple Neck, Reactive Pupils, EOMI  CVS: Normal S1, Normal S2, RRR, No murmurs or ES appreciated  Lungs: Poor air movement bilaterally Abdomen: Soft, non tender, non distended, + BS  Extremities: Warm and well perfused, No edema  Skin: No suspicious lesions appreciated  Psych: Normal Affect  Ancillary Information   CBC    Component Value Date/Time   WBC 4.7 01/13/2023 0424   RBC 3.67 (L) 01/13/2023 0424   HGB 11.3 (L) 01/13/2023 0424   HCT 33.2 (L) 01/13/2023 0424   PLT 137 (L) 01/13/2023 0424   MCV 90.5 01/13/2023 0424   MCH 30.8 01/13/2023 0424   MCHC 34.0 01/13/2023 0424   RDW 13.1 01/13/2023 0424   LYMPHSABS 1.0 01/12/2023 0213   MONOABS 0.5 01/12/2023 0213   EOSABS 0.2 01/12/2023 0213   BASOSABS 0.0 01/12/2023 0213   Labs and imaging were reviewed.  No data to display           Assessment & Plan:  Aaron Knox is a pleasant 68 year old male patient with a past medical history of PAD status post right femoral artery endarterectomy with iliac stent placement in June 2024, hypertension, hyperlipidemia, CAD status post PCI DES to the proximal left circumflex presenting today as a referral from his cardiology team for evaluation of dyspnea.  #Shortness of breath  #Tobacco use and nicotine dependence. Currently vaping on a daily basis.   Suspecting some component of COPD given smoking history and possibly reactive airway disease worsening with vaping.   []  PFTs  []  CXR  []  Referral to lung cancer screening prgram.  []  Discussed  importance of vaping cessation which he is agreeable with and provided NRT.  []  Will decide on inhaler therapy based on the above results.   Return in about 4 months (around 01/12/2024).  I spent 60 minutes caring for this patient today, including preparing to see the patient, obtaining a medical history , reviewing a separately obtained history, performing a medically appropriate examination and/or evaluation, counseling and educating the patient/family/caregiver, ordering medications, tests, or procedures, documenting clinical information in the electronic health record, and independently interpreting results (not separately reported/billed) and communicating results to the patient/family/caregiver  Annitta Kindler, MD  Pulmonary Critical Care 09/12/2023 9:17 AM

## 2023-09-13 ENCOUNTER — Ambulatory Visit: Attending: Medical

## 2023-09-13 DIAGNOSIS — R0600 Dyspnea, unspecified: Secondary | ICD-10-CM | POA: Diagnosis not present

## 2023-09-13 LAB — ECHOCARDIOGRAM COMPLETE
AR max vel: 3.51 cm2
AV Area VTI: 3.72 cm2
AV Area mean vel: 3.4 cm2
AV Mean grad: 2 mmHg
AV Peak grad: 4.2 mmHg
Ao pk vel: 1.02 m/s
Area-P 1/2: 3.21 cm2
Est EF: 55
S' Lateral: 4.33 cm

## 2023-09-14 ENCOUNTER — Ambulatory Visit: Payer: Self-pay | Admitting: Medical

## 2023-09-14 DIAGNOSIS — I471 Supraventricular tachycardia, unspecified: Secondary | ICD-10-CM

## 2023-09-23 DIAGNOSIS — R000 Tachycardia, unspecified: Secondary | ICD-10-CM | POA: Diagnosis not present

## 2023-09-27 ENCOUNTER — Telehealth: Payer: Self-pay | Admitting: Acute Care

## 2023-09-27 DIAGNOSIS — Z87891 Personal history of nicotine dependence: Secondary | ICD-10-CM

## 2023-09-27 DIAGNOSIS — F17210 Nicotine dependence, cigarettes, uncomplicated: Secondary | ICD-10-CM

## 2023-09-27 DIAGNOSIS — Z122 Encounter for screening for malignant neoplasm of respiratory organs: Secondary | ICD-10-CM

## 2023-09-27 MED ORDER — METOPROLOL SUCCINATE ER 50 MG PO TB24: 50.0000 mg | ORAL_TABLET | Freq: Every day | ORAL | 3 refills | Status: DC

## 2023-09-27 NOTE — Addendum Note (Signed)
 Addended by: Chapman Commodore on: 09/27/2023 02:50 PM   Modules accepted: Orders

## 2023-09-27 NOTE — Telephone Encounter (Signed)
 Lung Cancer Screening Narrative/Criteria Questionnaire (Cigarette Smokers Only- No Cigars/Pipes/vapes)   Aaron Knox   SDMV:10/10/2023 1:30p [name]        Mar 08, 1956   LDCT: 10/15/2023 8:30aOPIC    68 y.o.   Phone: [phone]  Lung Screening Narrative (confirm age 37-77 yrs Medicare / 50-80 yrs Private pay insurance)   Insurance information:[employer] mcr   Referring Provider:Dr. Assaker   This screening involves an initial phone call with a team member from our program. It is called a shared decision making visit. The initial meeting is required by  insurance and Medicare to make sure you understand the program. This appointment takes about 15-20 minutes to complete. You will complete the screening scan at your scheduled date/time.  This scan takes about 5-10 minutes to complete. You can eat and drink normally before and after the scan.  Criteria questions for Lung Cancer Screening:   Are you a current or former smoker? Current Age began smoking: 68yo   If you are a former smoker, what year did you quit smoking? Patient quit a couple times but totaled 3 years and started back each time.(within 15 yrs)   To calculate your smoking history, I need an accurate estimate of how many packs of cigarettes you smoked per day and for how many years. (Not just the number of PPD you are now smoking)   Years smoking 55 x Packs per day 1.5 = Pack years 55   (at least 20 pack yrs)   (Make sure they understand that we need to know how much they have smoked in the past, not just the number of PPD they are smoking now)  Do you have a personal history of cancer?  No    Do you have a family history of cancer? No  Are you coughing up blood?  No  Have you had unexplained weight loss of 15 lbs or more in the last 6 months? No  It looks like you meet all criteria.  When would be a good time for us  to schedule you for this screening?   Additional information: N/A

## 2023-09-29 ENCOUNTER — Emergency Department

## 2023-09-29 ENCOUNTER — Other Ambulatory Visit: Payer: Self-pay

## 2023-09-29 ENCOUNTER — Inpatient Hospital Stay
Admission: EM | Admit: 2023-09-29 | Discharge: 2023-10-02 | DRG: 271 | Disposition: A | Discharge status: Home / Self Care | Attending: Internal Medicine | Admitting: Internal Medicine

## 2023-09-29 DIAGNOSIS — Z9841 Cataract extraction status, right eye: Secondary | ICD-10-CM | POA: Diagnosis not present

## 2023-09-29 DIAGNOSIS — E785 Hyperlipidemia, unspecified: Secondary | ICD-10-CM | POA: Diagnosis present

## 2023-09-29 DIAGNOSIS — M79604 Pain in right leg: Secondary | ICD-10-CM | POA: Diagnosis present

## 2023-09-29 DIAGNOSIS — I739 Peripheral vascular disease, unspecified: Principal | ICD-10-CM | POA: Diagnosis present

## 2023-09-29 DIAGNOSIS — I252 Old myocardial infarction: Secondary | ICD-10-CM | POA: Diagnosis not present

## 2023-09-29 DIAGNOSIS — Z6825 Body mass index (BMI) 25.0-25.9, adult: Secondary | ICD-10-CM

## 2023-09-29 DIAGNOSIS — Z8249 Family history of ischemic heart disease and other diseases of the circulatory system: Secondary | ICD-10-CM | POA: Diagnosis not present

## 2023-09-29 DIAGNOSIS — E876 Hypokalemia: Secondary | ICD-10-CM | POA: Diagnosis present

## 2023-09-29 DIAGNOSIS — Y828 Other medical devices associated with adverse incidents: Secondary | ICD-10-CM | POA: Diagnosis present

## 2023-09-29 DIAGNOSIS — T82856A Stenosis of peripheral vascular stent, initial encounter: Secondary | ICD-10-CM | POA: Diagnosis not present

## 2023-09-29 DIAGNOSIS — Z139 Encounter for screening, unspecified: Secondary | ICD-10-CM

## 2023-09-29 DIAGNOSIS — I723 Aneurysm of iliac artery: Secondary | ICD-10-CM | POA: Diagnosis present

## 2023-09-29 DIAGNOSIS — Y929 Unspecified place or not applicable: Secondary | ICD-10-CM

## 2023-09-29 DIAGNOSIS — F17210 Nicotine dependence, cigarettes, uncomplicated: Secondary | ICD-10-CM | POA: Diagnosis present

## 2023-09-29 DIAGNOSIS — Z91148 Patient's other noncompliance with medication regimen for other reason: Secondary | ICD-10-CM

## 2023-09-29 DIAGNOSIS — K229 Disease of esophagus, unspecified: Secondary | ICD-10-CM | POA: Diagnosis present

## 2023-09-29 DIAGNOSIS — Z9842 Cataract extraction status, left eye: Secondary | ICD-10-CM | POA: Diagnosis not present

## 2023-09-29 DIAGNOSIS — Z7902 Long term (current) use of antithrombotics/antiplatelets: Secondary | ICD-10-CM

## 2023-09-29 DIAGNOSIS — Z880 Allergy status to penicillin: Secondary | ICD-10-CM

## 2023-09-29 DIAGNOSIS — E663 Overweight: Secondary | ICD-10-CM | POA: Diagnosis present

## 2023-09-29 DIAGNOSIS — Z886 Allergy status to analgesic agent status: Secondary | ICD-10-CM | POA: Diagnosis not present

## 2023-09-29 DIAGNOSIS — F17290 Nicotine dependence, other tobacco product, uncomplicated: Secondary | ICD-10-CM | POA: Diagnosis present

## 2023-09-29 DIAGNOSIS — Z5986 Financial insecurity: Secondary | ICD-10-CM

## 2023-09-29 DIAGNOSIS — I10 Essential (primary) hypertension: Secondary | ICD-10-CM | POA: Diagnosis present

## 2023-09-29 DIAGNOSIS — I2510 Atherosclerotic heart disease of native coronary artery without angina pectoris: Secondary | ICD-10-CM | POA: Diagnosis present

## 2023-09-29 DIAGNOSIS — T82868A Thrombosis of vascular prosthetic devices, implants and grafts, initial encounter: Secondary | ICD-10-CM | POA: Diagnosis present

## 2023-09-29 DIAGNOSIS — Z961 Presence of intraocular lens: Secondary | ICD-10-CM | POA: Diagnosis present

## 2023-09-29 DIAGNOSIS — Z79899 Other long term (current) drug therapy: Secondary | ICD-10-CM | POA: Diagnosis not present

## 2023-09-29 DIAGNOSIS — Z720 Tobacco use: Secondary | ICD-10-CM | POA: Insufficient documentation

## 2023-09-29 DIAGNOSIS — Z7901 Long term (current) use of anticoagulants: Secondary | ICD-10-CM

## 2023-09-29 DIAGNOSIS — Z91128 Patient's intentional underdosing of medication regimen for other reason: Secondary | ICD-10-CM

## 2023-09-29 DIAGNOSIS — I745 Embolism and thrombosis of iliac artery: Secondary | ICD-10-CM | POA: Diagnosis present

## 2023-09-29 DIAGNOSIS — Z955 Presence of coronary angioplasty implant and graft: Secondary | ICD-10-CM | POA: Diagnosis not present

## 2023-09-29 DIAGNOSIS — I700 Atherosclerosis of aorta: Secondary | ICD-10-CM | POA: Diagnosis present

## 2023-09-29 DIAGNOSIS — I70221 Atherosclerosis of native arteries of extremities with rest pain, right leg: Secondary | ICD-10-CM | POA: Diagnosis not present

## 2023-09-29 DIAGNOSIS — I708 Atherosclerosis of other arteries: Secondary | ICD-10-CM | POA: Diagnosis present

## 2023-09-29 DIAGNOSIS — I160 Hypertensive urgency: Secondary | ICD-10-CM | POA: Diagnosis present

## 2023-09-29 HISTORY — DX: Heart failure, unspecified: I50.9

## 2023-09-29 LAB — PROTIME-INR
INR: 1.1 (ref 0.8–1.2)
Prothrombin Time: 14.5 s (ref 11.4–15.2)

## 2023-09-29 LAB — TROPONIN I (HIGH SENSITIVITY)
Troponin I (High Sensitivity): 7 ng/L (ref <18)
Troponin I (High Sensitivity): 9 ng/L (ref <18)

## 2023-09-29 LAB — CK: Total CK: 165 U/L (ref 49–397)

## 2023-09-29 LAB — CBC WITH DIFFERENTIAL/PLATELET
Abs Immature Granulocytes: 0.01 10*3/uL (ref 0.00–0.07)
Basophils Absolute: 0.1 10*3/uL (ref 0.0–0.1)
Basophils Relative: 1 %
Eosinophils Absolute: 0.2 10*3/uL (ref 0.0–0.5)
Eosinophils Relative: 3 %
HCT: 37.3 % — ABNORMAL LOW (ref 39.0–52.0)
Hemoglobin: 12.7 g/dL — ABNORMAL LOW (ref 13.0–17.0)
Immature Granulocytes: 0 %
Lymphocytes Relative: 18 %
Lymphs Abs: 1.1 10*3/uL (ref 0.7–4.0)
MCH: 31.4 pg (ref 26.0–34.0)
MCHC: 34 g/dL (ref 30.0–36.0)
MCV: 92.1 fL (ref 80.0–100.0)
Monocytes Absolute: 0.5 10*3/uL (ref 0.1–1.0)
Monocytes Relative: 8 %
Neutro Abs: 4.3 10*3/uL (ref 1.7–7.7)
Neutrophils Relative %: 70 %
Platelets: 168 10*3/uL (ref 150–400)
RBC: 4.05 MIL/uL — ABNORMAL LOW (ref 4.22–5.81)
RDW: 13.1 % (ref 11.5–15.5)
WBC: 6.1 10*3/uL (ref 4.0–10.5)
nRBC: 0 % (ref 0.0–0.2)

## 2023-09-29 LAB — BASIC METABOLIC PANEL WITH GFR
Anion gap: 7 (ref 5–15)
BUN: 13 mg/dL (ref 8–23)
CO2: 23 mmol/L (ref 22–32)
Calcium: 9 mg/dL (ref 8.9–10.3)
Chloride: 107 mmol/L (ref 98–111)
Creatinine, Ser: 0.92 mg/dL (ref 0.61–1.24)
GFR, Estimated: >60 mL/min (ref >60)
Glucose, Bld: 102 mg/dL — ABNORMAL HIGH (ref 70–99)
Potassium: 3.3 mmol/L — ABNORMAL LOW (ref 3.5–5.1)
Sodium: 137 mmol/L (ref 135–145)

## 2023-09-29 LAB — HEPATIC FUNCTION PANEL
ALT: 24 U/L (ref 0–44)
AST: 29 U/L (ref 15–41)
Albumin: 3.9 g/dL (ref 3.5–5.0)
Alkaline Phosphatase: 74 U/L (ref 38–126)
Bilirubin, Direct: <0.1 mg/dL (ref 0.0–0.2)
Total Bilirubin: 0.8 mg/dL (ref 0.0–1.2)
Total Protein: 6.4 g/dL — ABNORMAL LOW (ref 6.5–8.1)

## 2023-09-29 LAB — MAGNESIUM: Magnesium: 2.2 mg/dL (ref 1.7–2.4)

## 2023-09-29 LAB — HEPARIN LEVEL (UNFRACTIONATED): Heparin Unfractionated: <0.1 [IU]/mL — ABNORMAL LOW (ref 0.30–0.70)

## 2023-09-29 LAB — APTT: aPTT: 31 s (ref 24–36)

## 2023-09-29 MED ORDER — OXYCODONE HCL 5 MG PO TABS: 10.0000 mg | ORAL_TABLET | ORAL | Status: DC | PRN

## 2023-09-29 MED ORDER — PANTOPRAZOLE SODIUM 20 MG PO TBEC
20.0000 mg | DELAYED_RELEASE_TABLET | Freq: Every day | ORAL | Status: DC
  Administered 2023-09-30 – 2023-10-02 (×3): 20 mg via ORAL
  Filled 2023-09-29 (×3): qty 1

## 2023-09-29 MED ORDER — OXYCODONE HCL 5 MG PO TABS
5.0000 mg | ORAL_TABLET | ORAL | Status: DC | PRN
  Administered 2023-09-29: 5 mg via ORAL
  Filled 2023-09-29: qty 1

## 2023-09-29 MED ORDER — NICOTINE 7 MG/24HR TD PT24
7.0000 mg | MEDICATED_PATCH | Freq: Every day | TRANSDERMAL | Status: DC
  Administered 2023-09-30 – 2023-10-01 (×2): 7 mg via TRANSDERMAL
  Filled 2023-09-29 (×4): qty 1

## 2023-09-29 MED ORDER — ATORVASTATIN CALCIUM 20 MG PO TABS
80.0000 mg | ORAL_TABLET | Freq: Every day | ORAL | Status: DC
  Administered 2023-09-30 – 2023-10-01 (×2): 80 mg via ORAL
  Filled 2023-09-29 (×2): qty 4

## 2023-09-29 MED ORDER — HEPARIN (PORCINE) 25000 UT/250ML-% IV SOLN
1400.0000 [IU]/h | INTRAVENOUS | Status: DC
  Administered 2023-09-29: 1500 [IU]/h via INTRAVENOUS
  Administered 2023-10-01 – 2023-10-02 (×3): 1400 [IU]/h via INTRAVENOUS
  Filled 2023-09-29 (×4): qty 250

## 2023-09-29 MED ORDER — IOHEXOL 350 MG/ML SOLN
100.0000 mL | Freq: Once | INTRAVENOUS | Status: AC | PRN
  Administered 2023-09-29: 100 mL via INTRAVENOUS

## 2023-09-29 MED ORDER — HEPARIN BOLUS VIA INFUSION
5300.0000 [IU] | Freq: Once | INTRAVENOUS | Status: AC
  Administered 2023-09-29: 5300 [IU] via INTRAVENOUS
  Filled 2023-09-29: qty 5300

## 2023-09-29 MED ORDER — METOPROLOL SUCCINATE ER 50 MG PO TB24
50.0000 mg | ORAL_TABLET | Freq: Every day | ORAL | Status: DC
  Administered 2023-09-30 – 2023-10-02 (×3): 50 mg via ORAL
  Filled 2023-09-29 (×3): qty 1

## 2023-09-29 MED ORDER — ISOSORBIDE MONONITRATE ER 30 MG PO TB24
30.0000 mg | ORAL_TABLET | Freq: Every day | ORAL | Status: DC
  Administered 2023-09-29 – 2023-10-01 (×3): 30 mg via ORAL
  Filled 2023-09-29 (×3): qty 1

## 2023-09-29 MED ORDER — FAMOTIDINE 20 MG PO TABS: 20.0000 mg | ORAL_TABLET | Freq: Two times a day (BID) | ORAL | Status: DC

## 2023-09-29 MED ORDER — POTASSIUM CHLORIDE 20 MEQ PO PACK
40.0000 meq | PACK | Freq: Once | ORAL | Status: AC
  Administered 2023-09-29: 40 meq via ORAL
  Filled 2023-09-29: qty 2

## 2023-09-29 MED ORDER — NICOTINE POLACRILEX 2 MG MT GUM
2.0000 mg | CHEWING_GUM | OROMUCOSAL | Status: DC | PRN
  Filled 2023-09-29: qty 1

## 2023-09-29 NOTE — Assessment & Plan Note (Signed)
  Hypertension Patient reports his blood pressures in the 120s at home after recently increasing his dosage of metoprolol  to 50 mg although is a unreliable historian also saying is in the 160s believe his current SBP at 179 is a hypertensive urgency no evidence of end organ damage we will add his isosorbide  which should be on board as per the last cards note and monitor although this should likely be a outpatient pursuit in the absence of any acute endorgan damage given his acute illness and pain, addressing his acute pain with as needed oxycodone  which is secondary to his PAD, continue metoprolol  50 succinate

## 2023-09-29 NOTE — Assessment & Plan Note (Signed)
 Nicotine  usage Recently moved from Vapes back to cigarettes continue his lozenges and prescribe nicotine  patch

## 2023-09-29 NOTE — Assessment & Plan Note (Signed)
  Esophageal lesion Seen on the patient's CT, needs further investigation needs close interval follow-up with gastroenterology as outpatient continue his PPI :Stable asymmetric soft tissue involving the anterior, distal esophagus since remote prior CT examination of 10/31/2022 and may represent a mass such as a leiomyoma, epiphrenic diverticulum, or complex cystic collection such as an esophageal duplication cyst. ;Counseled the patient

## 2023-09-29 NOTE — Assessment & Plan Note (Signed)
 Peripheral arterial disease, progressive symptoms Has been off both antiplatelets and Eliquis  for more than 1 month Currently no rest pain however significant nocturnal awakening and decreased ability to ambulate due to progressive symptoms over the last day which is correlated with the patient's CT findings: 1. 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 Reconstitution of the right superficial femoral artery via the corona mortis with multifocal hemodynamically significant stenoses, most severe greater than 75% proximally. There is hypoperfusion of the right lower extremity with delayed and diminished arterial opacification. These findings appear similar since prior examination 01/12/2023, though the degree of relative hypoperfusion of the right lower extremity has progressed due to progressive disease within the reconstituted right superficial artery. 2. Three-vessel runoff at least to the level of the distal diaphysis of the right lower extremity. Patency distally is not well assessed due to filling. 3. Two vessel runoff to the left ankle with occlusion of the left anterior tibial artery at the level of the proximal diaphysis. Plan: Heparin  drip for PAD protocol as per pharmacy, A1c check, continue the patient's high intensity statin, defer to the vascular surgery team has been consulted regarding Cilostazol, angiogram or further intervention, deferring antiplatelets to surgical team

## 2023-09-29 NOTE — ED Triage Notes (Signed)
 Pt to ED for RLE pain since yesterday--hx PAD with blockage and vascular surgery to that side. Pain worse with walking. Tingling, burning. Able to feel and move toes. Extremity is warm. Tibial pulse present. Pt currently not on blood thinners., taken off by Dr Marea about 5-6 weeks ago.

## 2023-09-29 NOTE — Assessment & Plan Note (Signed)
 Aortic atherosclerosis Continue atorvastatin  80 mg will check a CPK although believe this is primarily a vascular event no evidence of compartment syndrome, outpatient follow-up

## 2023-09-29 NOTE — ED Notes (Signed)
 Core MD at bedside

## 2023-09-29 NOTE — Assessment & Plan Note (Signed)
  Iliac aneurysm Noted in his last vascular surgery note will defer to vascular surgery

## 2023-09-29 NOTE — Assessment & Plan Note (Signed)
 Hypokalemia Potassium was 3.3 will administer 40 mill equivalent by mouth and check magnesium  levels

## 2023-09-29 NOTE — ED Provider Notes (Signed)
 [facility] Provider Note    Event Date/Time   First MD Initiated Contact with Patient 09/29/23 1735     (approximate)   History   Claudication (PAD)   HPI  Aaron Knox is a 68 y.o. male   who presents to the emergency department today with right leg pain and tingling.  Symptoms started yesterday but got worse today.  Patient does have history of peripheral arterial disease in that leg and states he has had stents placed in the past.  Additionally he has had occlusion of those stents.  He currently is not taking any blood thinning medications stating he was taken off of it a little over a month ago.  Patient denies any unusual exertion or trauma to the leg.  Denies any chest pain or shortness of breath.      Physical Exam   Triage Vital Signs: ED Triage Vitals  Encounter Vitals Group     BP 09/29/23 1725 (!) 168/89     Girls Systolic BP Percentile --      Girls Diastolic BP Percentile --      Boys Systolic BP Percentile --      Boys Diastolic BP Percentile --      Pulse Rate 09/29/23 1725 72     Resp 09/29/23 1725 20     Temp 09/29/23 1725 98.4 F (36.9 C)     Temp Source 09/29/23 1725 Oral     SpO2 09/29/23 1725 100 %     Weight 09/29/23 1724 197 lb (89.4 kg)     Height 09/29/23 1724 6' 2 (1.88 m)     Head Circumference --      Peak Flow --      Pain Score 09/29/23 1727 7     Pain Loc --      Pain Education --      Exclude from Growth Chart --     Most recent vital signs: Vitals:   09/29/23 1725  BP: (!) 168/89  Pulse: 72  Resp: 20  Temp: 98.4 F (36.9 C)  SpO2: 100%   General: Awake, alert, oriented. CV:  Good peripheral perfusion. Regular rate and rhythm. Resp:  Normal effort. Lungs clear. Abd:  No distention. None Other:  DP faintly palpable in right leg, no discoloration, warm. Sensation intact. Able to move all toes.    ED Results / Procedures / Treatments   Labs (all labs ordered are listed, but only abnormal  results are displayed) Labs Reviewed  CBC WITH DIFFERENTIAL/PLATELET - Abnormal; Notable for the following components:      Result Value   RBC 4.05 (*)    Hemoglobin 12.7 (*)    HCT 37.3 (*)    All other components within normal limits  BASIC METABOLIC PANEL WITH GFR - Abnormal; Notable for the following components:   Potassium 3.3 (*)    Glucose, Bld 102 (*)    All other components within normal limits     EKG  None   RADIOLOGY I independently interpreted and visualized the CT angio. My interpretation: clot in right leg Radiology interpretation:  IMPRESSION:  1. Chronic occlusion of the right common and external iliac artery  and common femoral arterial stents. Reconstitution of the right  superficial femoral artery via the corona mortis with multifocal  hemodynamically significant stenoses, most severe greater than 75%  proximally. There is hypoperfusion of the right lower extremity with  delayed and diminished arterial opacification. These findings appear  similar  since prior examination 01/12/2023, though the degree of  relative hypoperfusion of the right lower extremity has progressed  due to progressive disease within the reconstituted right  superficial artery.  2. Three-vessel runoff at least to the level of the distal diaphysis  of the right lower extremity. Patency distally is not well assessed  due to filling.  3. Two vessel runoff to the left ankle with occlusion of the left  anterior tibial artery at the level of the proximal diaphysis.  4. Stable asymmetric soft tissue involving the anterior, distal  esophagus since remote prior CT examination of 10/31/2022 and may  represent a mass such as a leiomyoma, epiphrenic diverticulum, or  complex cystic collection such as an esophageal duplication cyst.  5. Aortic atherosclerosis.    Aortic Atherosclerosis (ICD10-I70.0).   PROCEDURES:  Critical Care performed: Yes  CRITICAL CARE Performed by: Guadalupe Eagles   Total critical care time: 30 minutes  Critical care time was exclusive of separately billable procedures and treating other patients.  Critical care was necessary to treat or prevent imminent or life-threatening deterioration.  Critical care was time spent personally by me on the following activities: development of treatment plan with patient and/or surrogate as well as nursing, discussions with consultants, evaluation of patient's response to treatment, examination of patient, obtaining history from patient or surrogate, ordering and performing treatments and interventions, ordering and review of laboratory studies, ordering and review of radiographic studies, pulse oximetry and re-evaluation of patient's condition.   Procedures    MEDICATIONS ORDERED IN ED: Medications - No data to display   IMPRESSION / MDM / ASSESSMENT AND PLAN / ED COURSE  I reviewed the triage vital signs and the nursing notes.                              Differential diagnosis includes, but is not limited to, arterial occlusion, DVT, cellulitis  Patient's presentation is most consistent with acute presentation with potential threat to life or bodily function.   The patient is on the cardiac monitor to evaluate for evidence of arrhythmia and/or significant heart rate changes.  Patient presented to the emergency department today because of concerns for right leg pain in the setting of known arterial disease status post stents.  On exam patient's leg does not appear cool or discolored.  Faint DP is felt.  Did obtain a CT scan which is concerning for clots in the patient's stents although he does have collateral flow.  Discussed with Dr. Tisa with vascular surgery.  Did not feel patient required emergent intervention at this time.  At this time will start IV heparin .  Discussed with Dr. Carmie with the hospitalist service.      FINAL CLINICAL IMPRESSION(S) / ED DIAGNOSES   Final diagnoses:  PAD  (peripheral artery disease) (HCC)  Right leg pain       Note:  This document was prepared using Dragon voice recognition software and may include unintentional dictation errors.    Eagles Guadalupe, MD 09/29/23 2136

## 2023-09-29 NOTE — Assessment & Plan Note (Signed)
   Social determinants of health the patient cannot afford his apixaban  hence he has been off consulting transition of care

## 2023-09-29 NOTE — H&P (Addendum)
 History and Physical    Patient: Aaron Knox FMW:[id number] DOB: Apr 07, 1956 DOA: 09/29/2023 DOS: the patient was seen and examined on 09/29/2023 PCP: Pcp, No  Patient coming from: Home  Chief Complaint:  Chief Complaint  Patient presents with   Claudication    PAD   HPI:  68 year old gentleman with a past medical history of coronary artery disease status post PCI and DES to  proximal left circumflex,PAD status post right femoral artery endarterectomy with iliac stent placement in June 2024, hypertension, hyperlipidemia who presents with increasing right lower extremity pain and tingling progressively worse since yesterday.  The patient reports this is of the same nature of pain of his other ischemic episodes denies any pins-and-needles or change in nature denies any trauma to the extremity or his back no bladder or bowel changes or saddle anesthesia.  The patient reports no rest pain although has increased nocturnal awakening and his symptoms have worsened in the sense that his exertional tolerance has acutely decreased since yesterday to the point where he cannot move without the pain.  He reports when he tries to get out of bed the right lower extremity pain acutely arises when he tries to ambulate and it wakes him up at night like a toothache.  Of note the patient showed me on his telephone his recent medications which was metoprolol  recently increased to 50 mg daily from 25 he reports he has been monitoring his blood pressure at home and it was in the 120s with some high 50s heart rate although acutely increased with his pain this was a recent medication change for him is also taking pantoprazole  and atorvastatin  80 mg with no other medications.  Of note he has been not taking Plavix  or Eliquis  for multiple months more than 1 month.  He is unsure the last date.  Past medical records reviewed and summarized: 08/22/2022 cardiology visit patient has coronary artery disease with a allergy  to aspirin , true allergy coronary artery disease status post PCI/DES to proximal left circumflex, office visit 09/12/2023 with pulmonary: Ordered PFTs and chest x-ray at that time for progressive exertional dyspnea, office visit 08/28/2023 patient was off Eliquis  due to insurance reasons but was post to be on Plavix , this was a vascular surgery office visit.  The case was discussed with the emergency department physician who spoke to the on-call vascular surgeon Dr. Tisa who advised for heparin  and will review did not believe this was an emergent case. Last echocardiogram was from 09/13/2023 EF is 55%  Review of Systems:  Constitutional: Denies Weight Loss, Fever, Chills or Night Sweats Eyes: Denies Blurry Vision, Eye Pain or Decreased Vision ENT: Denies Sore Throat , Tinnitus, Hearing Loss Respiratory: Reports Shortness of Breath with exertion but denies Cough, Hemoptysis, Wheezing, Pleurisy Cardiovascular: Denies Chest Pain, Paroxsymal Nocturnal Dyspnea, Palpitations, Edema but reports RLE pain at night and minimal exertion Gastrointestinal: Denies Nausea, Vomiting, Diarrhea, Hematemesis, Melena Genitourinary: Denies hematuria, dysuria, hesitancy, incontinence Musculoskeletal: Denies Arthralgias, Myalgias, Muscle Weakness, Joint Swelling Skin: Denies Rash, Pruritis, Sores, Nail Changes or Skin Thickening Allergy/Immun: Denies history of Hay Fever, Positive PPD or Hives All other systems were reviewed and are negative  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)    CAD in native artery    a.) LHC/PCI 08/09/2014 (setting of STEMI): 30% mLAD, 99% pLCx (3.0 x 15 mm Resolute Integrity DES x 1 with 10% residual post-intervention stenosis), 45% mLCx, 20% pRCA, 40% dRCA);  b.) LHC 01/29/2018: 30% mLAD, 45% mLCx, 50% pRCA, 80% dRCA --> med mgmt   CHF (congestive heart failure) (HCC)    DDD (degenerative disc disease), lumbar    Diastolic  dysfunction    a.) TTE 08/11/2014 (s/p STEMI): EF 45-50%, inf HK, AoV sclerosis, G1DD; b.) TTE 02/28/2018: EF 55-60%, G1DD   History of bilateral cataract extraction 2023   HTN (hypertension)    Hyperlipidemia    Long term current use of antithrombotics/antiplatelets    a.) clopidogrel    ST elevation myocardial infarction (STEMI) of true posterior wall (HCC) 08/09/2014   a.) LHC/PCI 08/09/2014: 3.0 x 15 mm Resolute Integrity DES x 1 pLCx with 10% residual post-intervention stenosis)   Unstable angina [facility])    Past Surgical History:  Procedure Laterality Date   CARDIAC CATHETERIZATION N/A 08/09/2014   Procedure: Left Heart Cath and Coronary Angiography;  Surgeon: Deatrice DELENA Cage, MD;  Location: [facility] INVASIVE CV LAB;  Service: Cardiovascular;  Laterality: N/A;   CARDIAC CATHETERIZATION N/A 08/09/2014   Procedure: Coronary Stent Intervention;  Surgeon: Deatrice DELENA Cage, MD;  Location: [facility] INVASIVE CV LAB;  Service: Cardiovascular;  Laterality: N/A;   CATARACT EXTRACTION W/ INTRAOCULAR LENS  IMPLANT, BILATERAL Bilateral 2023   CORONARY ANGIOPLASTY  08/09/2014   ENDARTERECTOMY FEMORAL Right 09/20/2022   Procedure: ENDARTERECTOMY FEMORAL;  Surgeon: Marea Selinda RAMAN, MD;  Location: [facility] ORS;  Service: Vascular;  Laterality: Right;   HAND SURGERY Left 1980   bone chip with nerve damage   INSERTION OF ILIAC STENT Right 09/20/2022   Procedure: INSERTION OF ILIAC STENT;  Surgeon: Marea Selinda RAMAN, MD;  Location: [facility] ORS;  Service: Vascular;  Laterality: Right;   LEFT HEART CATH AND CORONARY ANGIOGRAPHY N/A 01/29/2018   Procedure: LEFT HEART CATH AND CORONARY ANGIOGRAPHY;  Surgeon: Mady Bruckner, MD;  Location: [facility] INVASIVE CV LAB;  Service: Cardiovascular;  Laterality: N/A;   LOWER EXTREMITY ANGIOGRAPHY Right 08/07/2022   Procedure: Lower Extremity Angiography;  Surgeon: Marea Selinda RAMAN, MD;  Location: [facility] INVASIVE CV LAB;  Service: Cardiovascular;  Laterality: Right;   LOWER EXTREMITY ANGIOGRAPHY Right  01/12/2023   Procedure: Lower Extremity Angiography;  Surgeon: Marea Selinda RAMAN, MD;  Location: [facility] INVASIVE CV LAB;  Service: Cardiovascular;  Laterality: Right;   nerve reconstruction Left 1980   hand   Social History:  reports that he has been smoking cigarettes. He started smoking about 57 years ago. He has a 84.5 pack-year smoking history. He has never used smokeless tobacco. He reports current alcohol use of about 3.0 standard drinks of alcohol per week. He reports that he does not use drugs.  Allergies  Allergen Reactions   Aspirin  Anaphylaxis   Penicillins Other (See Comments)    Childhood allergy per pt's mother. He does not recall any details of the reaction. Willing to try beta lactams.    Family History  Problem Relation Age of Onset   CAD Maternal Grandfather     Prior to Admission medications   Medication Sig Start Date End Date Taking? Authorizing Provider  atorvastatin  (LIPITOR ) 80 MG tablet Take 1 tablet (80 mg total) by mouth daily at 6 PM. 08/06/23   Furth, Cadence H, PA-C  famotidine  (PEPCID ) 20 MG tablet Take 1 tablet (20 mg total) by mouth 2 (two) times daily. 01/14/23   Viviann Pastor, MD  metoprolol  succinate (TOPROL  XL) 50 MG 24 hr tablet Take 1 tablet (50 mg total) by mouth daily. 09/27/23 09/26/24  Furth, Cadence H, PA-C  nicotine  polacrilex (NICOTINE  MINI)  2 MG lozenge Take 1 lozenge (2 mg total) by mouth every 2 (two) hours as needed. 09/12/23 12/11/23  Assaker, Darrin, MD  pantoprazole  (PROTONIX ) 20 MG tablet Take 1 tablet (20 mg total) by mouth daily. 08/06/23   Franchester, Cadence H, PA-C    Physical Exam: Vitals:   09/29/23 1930 09/29/23 2000 09/29/23 2127 09/29/23 2130  BP: (!) 167/63 (!) 188/69  (!) 145/105  Pulse: (!) 48 (!) 51  68  Resp: (!) 21 19  20   Temp:   98 F (36.7 C)   TempSrc:   Oral   SpO2: 97% 99%  97%  Weight:      Height:      Patient seen and examined in room 2 in the emergency department at approximately 8:45 PM Constitutional:   Vital Signs as per [facility] than three noted] No Acute Distress ENMT:   External Appearance of Ears and Nose without obvious deformity, masses or scar           Poor Dentition Neck:     Trachea Midline, Neck Symmetric Respiratory:   Respiratory Effort Normal: No Use of Respiratory Muscles,No  Intercostal Retractions             Lungs Clear to Auscultation Bilaterally Cardiovascular:   Heart Auscultated: Regular Regular without any added sounds or murmurs              No Lower Extremity Edema Gastrointestinal:  Abdomen soft and nontender without palpable masses, guarding or rebound  No Palpable Splenomegaly or Hepatomegaly Neurologic:  Can move all 4 to gravity,  Capillary refill time was increased on both bilateral lower extremity legs around 3 seconds cannot feel the right lower extremity dorsalis pedis although sensation and motor is intact, compartments of leg supple Psychiatric:  Patient Orientated to Time, Place and Person Patient with appropriate mood and affect Recent and Remote Memory Intact   Data Reviewed: Labs, Radiology, ECG as detailed in HPI and A/P   Assessment and Plan: * Peripheral arterial disease (HCC) Peripheral arterial disease, progressive symptoms Has been off both antiplatelets and Eliquis  for more than 1 month Currently no rest pain however significant nocturnal awakening and decreased ability to ambulate due to progressive symptoms over the last day which is correlated with the patient's CT findings: 1. 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 Reconstitution of the right superficial femoral artery via the corona mortis with multifocal hemodynamically significant stenoses, most severe greater than 75% proximally. There is hypoperfusion of the right lower extremity with delayed and diminished arterial opacification. These findings appear similar since prior examination 01/12/2023, though the  degree of relative hypoperfusion of the right lower extremity has progressed due to progressive disease within the reconstituted right superficial artery. 2. Three-vessel runoff at least to the level of the distal diaphysis of the right lower extremity. Patency distally is not well assessed due to filling. 3. Two vessel runoff to the left ankle with occlusion of the left anterior tibial artery at the level of the proximal diaphysis. Plan: Heparin  drip for PAD protocol as per pharmacy, A1c check, continue the patient's high intensity statin, defer to the vascular surgery team has been consulted regarding Cilostazol, angiogram or further intervention, deferring antiplatelets to surgical team  Essential hypertension  Hypertension Patient reports his systolic blood pressures in the 120s at home after recently increasing his dosage of metoprolol  to 50 mg although is a unreliable historian also saying is in the  160s believe his current SBP at 179 is a hypertensive urgency no evidence of end organ damage we will add his isosorbide  which should be on board as per the last cards note and monitor although this should likely be a outpatient pursuit in the absence of any acute endorgan damage given his acute illness and pain, addressing his acute pain with as needed oxycodone  which is secondary to his PAD, continue metoprolol  50 succinate  Cardiac monitoring ordered given the patient's reports of some recent bradycardia as well as tachycardia unclear historian although recent beta-blocker change dose can likely de-escalate if stable overnight  Aortic atherosclerosis (HCC) Aortic atherosclerosis Continue atorvastatin  80 mg will check a CPK although believe this is primarily a vascular event no evidence of compartment syndrome, outpatient follow-up  Esophageal lesion  Esophageal lesion Seen on the patient's CT, needs further investigation needs close interval follow-up with gastroenterology as outpatient  continue his PPI :Stable asymmetric soft tissue involving the anterior, distal esophagus since remote prior CT examination of 10/31/2022 and may represent a mass such as a leiomyoma, epiphrenic diverticulum, or complex cystic collection such as an esophageal duplication cyst. ;Counseled the patient  Encounter for screening involving social determinants of health (SDoH)   Social determinants of health the patient cannot afford his apixaban  hence he has been off consulting transition of care   Nicotine  abuse Nicotine  usage Recently moved from Vapes back to cigarettes continue his lozenges and prescribe nicotine  patch   Iliac aneurysm (HCC)  Iliac aneurysm Noted in his last vascular surgery note will defer to vascular surgery   CAD (coronary artery disease)  Coronary artery disease Denies any chest pain although his had progressive dyspnea as well documented in his pulmonary note we will obtain an ECG and a troponin to ensure no acute cardiac events, given presence of arterial clot d-dimer for venous thrombi will not change management (on heparin  drip), echo 6/5 when symptomatic no heart strain as per outpatient notes he is aspirin  allergic and should be on Plavix , should be re-evaluated in light of AC choices long-term for PAD  Hypokalemia Hypokalemia Potassium was 3.3 will administer 40 mill equivalent by mouth and check magnesium  levels     Advance Care Planning:   Code Status: Full Code as per patient  Consults: Vascular surgery, sent secure chat to Dr. Tisa  who kindly accepted and ED also discussed the case EMR order placed  Family Communication: Patient Only  Severity of Illness: The appropriate patient status for this patient is INPATIENT. Inpatient status is judged to be reasonable and necessary in order to provide the required intensity of service to ensure the patient's safety. The patient's presenting symptoms, physical exam findings, and initial radiographic and  laboratory data in the context of their chronic comorbidities is felt to place them at high risk for further clinical deterioration. Furthermore, it is not anticipated that the patient will be medically stable for discharge from the hospital within 2 midnights of admission.   * I certify that at the point of admission it is my clinical judgment that the patient will require inpatient hospital care spanning beyond 2 midnights from the point of admission due to high intensity of service, high risk for further deterioration and high frequency of surveillance required.*  Author: Prentice JAYSON Lowenstein, MD 09/29/2023 9:33 PM  For on call review [email or web address].

## 2023-09-29 NOTE — Assessment & Plan Note (Signed)
  Coronary artery disease Denies any chest pain although his had progressive dyspnea as well documented in his pulmonary note we will obtain an ECG and a troponin to ensure no acute cardiac events

## 2023-09-29 NOTE — ED Notes (Signed)
 Pt transported by [facility] on stretcher with cardiac monitoring to 207. NADN.

## 2023-09-29 NOTE — ED Notes (Signed)
 Pt transported to CT ?

## 2023-09-29 NOTE — Consult Note (Signed)
 Pharmacy Consult Note - Anticoagulation  Pharmacy Consult for heparin  Indication: PAD  PATIENT MEASUREMENTS: Height: 6' 2 (188 cm) Weight: 89.4 kg (197 lb) IBW/kg (Calculated) : 82.2 HEPARIN  DW (KG): 89.4  VITAL SIGNS: Temp: 98.4 F (36.9 C) (06/21 1725) Temp Source: Oral (06/21 1725) BP: 158/109 (06/21 1900) Pulse Rate: 59 (06/21 1900)  Recent Labs    09/29/23 1759  HGB 12.7*  HCT 37.3*  PLT 168  CREATININE 0.92    Estimated Creatinine Clearance: 86.9 mL/min (by C-G formula based on SCr of 0.92 mg/dL).  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)    CAD in native artery    a.) LHC/PCI 08/09/2014 (setting of STEMI): 30% mLAD, 99% pLCx (3.0 x 15 mm Resolute Integrity DES x 1 with 10% residual post-intervention stenosis), 45% mLCx, 20% pRCA, 40% dRCA); b.) LHC 01/29/2018: 30% mLAD, 45% mLCx, 50% pRCA, 80% dRCA --> med mgmt   CHF (congestive heart failure) (HCC)    DDD (degenerative disc disease), lumbar    Diastolic dysfunction    a.) TTE 08/11/2014 (s/p STEMI): EF 45-50%, inf HK, AoV sclerosis, G1DD; b.) TTE 02/28/2018: EF 55-60%, G1DD   History of bilateral cataract extraction 2023   HTN (hypertension)    Hyperlipidemia    Long term current use of antithrombotics/antiplatelets    a.) clopidogrel    ST elevation myocardial infarction (STEMI) of true posterior wall (HCC) 08/09/2014   a.) LHC/PCI 08/09/2014: 3.0 x 15 mm Resolute Integrity DES x 1 pLCx with 10% residual post-intervention stenosis)   Unstable angina [facility])     ASSESSMENT: 68 y.o. male with PMH including PAD, CAD, vascular surgery is presenting with RLE pain. Patient was started on apixaban  and clopidogrel  by vascular surgery but has been off apixaban  for several months per chart review due to affordability/coverage. Pharmacy has been consulted to initiate and manage heparin  intravenous infusion. Additional baseline labs have  been ordered.  Pertinent medications: Clopidogrel  75mg  daily  Goal(s) of therapy: Heparin  level 0.3 - 0.7 units/mL Monitor platelets by anticoagulation protocol: Yes   Baseline anticoagulation labs: Recent Labs    09/29/23 1759  HGB 12.7*  PLT 168    Date Time aPTT/HL Rate/Comment     PLAN: Give 5300 units bolus x1; then start heparin  infusion at 1500 units/hour. Check heparin  level in 8 hours, then daily once at least two levels are consecutively therapeutic. Monitor CBC daily while on heparin  infusion.   Will M. Lenon, PharmD Clinical Pharmacist 09/29/2023 8:33 PM

## 2023-09-29 NOTE — ED Notes (Signed)
 Report received from Michael RN, assuming pt care. Pt back to room from CT. NADN. Pt laying in bed, bed in lowest position, call bell within reach.

## 2023-09-30 DIAGNOSIS — I739 Peripheral vascular disease, unspecified: Secondary | ICD-10-CM | POA: Diagnosis not present

## 2023-09-30 LAB — COMPREHENSIVE METABOLIC PANEL WITH GFR
ALT: 21 U/L (ref 0–44)
AST: 20 U/L (ref 15–41)
Albumin: 3.5 g/dL (ref 3.5–5.0)
Alkaline Phosphatase: 72 U/L (ref 38–126)
Anion gap: 5 (ref 5–15)
BUN: 12 mg/dL (ref 8–23)
CO2: 25 mmol/L (ref 22–32)
Calcium: 8.4 mg/dL — ABNORMAL LOW (ref 8.9–10.3)
Chloride: 107 mmol/L (ref 98–111)
Creatinine, Ser: 0.83 mg/dL (ref 0.61–1.24)
GFR, Estimated: >60 mL/min (ref >60)
Glucose, Bld: 95 mg/dL (ref 70–99)
Potassium: 3.6 mmol/L (ref 3.5–5.1)
Sodium: 137 mmol/L (ref 135–145)
Total Bilirubin: 0.7 mg/dL (ref 0.0–1.2)
Total Protein: 6.1 g/dL — ABNORMAL LOW (ref 6.5–8.1)

## 2023-09-30 LAB — HEMOGLOBIN A1C
Hgb A1c MFr Bld: 5.3 % (ref 4.8–5.6)
Mean Plasma Glucose: 105.41 mg/dL

## 2023-09-30 LAB — CBC
HCT: 35.4 % — ABNORMAL LOW (ref 39.0–52.0)
Hemoglobin: 12 g/dL — ABNORMAL LOW (ref 13.0–17.0)
MCH: 31.7 pg (ref 26.0–34.0)
MCHC: 33.9 g/dL (ref 30.0–36.0)
MCV: 93.4 fL (ref 80.0–100.0)
Platelets: 154 10*3/uL (ref 150–400)
RBC: 3.79 MIL/uL — ABNORMAL LOW (ref 4.22–5.81)
RDW: 13.1 % (ref 11.5–15.5)
WBC: 5.4 10*3/uL (ref 4.0–10.5)
nRBC: 0 % (ref 0.0–0.2)

## 2023-09-30 LAB — HEPARIN LEVEL (UNFRACTIONATED)
Heparin Unfractionated: 0.77 [IU]/mL — ABNORMAL HIGH (ref 0.30–0.70)
Heparin Unfractionated: 0.66 [IU]/mL (ref 0.30–0.70)
Heparin Unfractionated: 0.61 [IU]/mL (ref 0.30–0.70)

## 2023-09-30 NOTE — Consult Note (Signed)
 Pharmacy Consult Note - Anticoagulation  Pharmacy Consult for heparin  Indication: PAD  PATIENT MEASUREMENTS: Height: 6' 2 (188 cm) Weight: 89.4 kg (197 lb) IBW/kg (Calculated) : 82.2 HEPARIN  DW (KG): 89.4  VITAL SIGNS: Temp: 98.2 F (36.8 C) (06/22 1739) Temp Source: Oral (06/22 1739) BP: 117/58 (06/22 1739) Pulse Rate: 52 (06/22 1739)  Recent Labs    09/29/23 1759 09/29/23 1759 09/29/23 2039 09/29/23 2256 09/30/23 0527 09/30/23 1320 09/30/23 1928  HGB 12.7*  --   --   --  12.0*  --   --   HCT 37.3*  --   --   --  35.4*  --   --   PLT 168  --   --   --  154  --   --   APTT  --   --  31  --   --   --   --   LABPROT  --   --  14.5  --   --   --   --   INR  --   --  1.1  --   --   --   --   HEPARINUNFRC  --    < > <0.10*  --  0.77*   < > 0.61  CREATININE 0.92  --   --   --  0.83  --   --   CKTOTAL 165  --   --   --   --   --   --   TROPONINIHS 7  --   --  9  --   --   --    < > = values in this interval not displayed.    Estimated Creatinine Clearance: 96.3 mL/min (by C-G formula based on SCr of 0.83 mg/dL).  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)    CAD in native artery    a.) LHC/PCI 08/09/2014 (setting of STEMI): 30% mLAD, 99% pLCx (3.0 x 15 mm Resolute Integrity DES x 1 with 10% residual post-intervention stenosis), 45% mLCx, 20% pRCA, 40% dRCA); b.) LHC 01/29/2018: 30% mLAD, 45% mLCx, 50% pRCA, 80% dRCA --> med mgmt   CHF (congestive heart failure) (HCC)    DDD (degenerative disc disease), lumbar    Diastolic dysfunction    a.) TTE 08/11/2014 (s/p STEMI): EF 45-50%, inf HK, AoV sclerosis, G1DD; b.) TTE 02/28/2018: EF 55-60%, G1DD   History of bilateral cataract extraction 2023   HTN (hypertension)    Hyperlipidemia    Long term current use of antithrombotics/antiplatelets    a.) clopidogrel    ST elevation myocardial infarction (STEMI) of true posterior wall (HCC) 08/09/2014    a.) LHC/PCI 08/09/2014: 3.0 x 15 mm Resolute Integrity DES x 1 pLCx with 10% residual post-intervention stenosis)   Unstable angina [facility])     ASSESSMENT: 68 y.o. male with PMH including PAD, CAD, vascular surgery is presenting with RLE pain. Patient was started on apixaban  and clopidogrel  by vascular surgery but has been off apixaban  for several months per chart review due to affordability/coverage. Pharmacy has been consulted to initiate and manage heparin  intravenous infusion. Additional baseline labs have been ordered.  Pertinent medications: Clopidogrel  75mg  daily  Goal(s) of therapy: Heparin  level 0.3 - 0.7 units/mL Monitor platelets by anticoagulation protocol: Yes   Baseline anticoagulation labs: Recent Labs    09/29/23 1759 09/29/23 2039 09/30/23 0527  APTT  --  31  --   INR  --  1.1  --   HGB 12.7*  --  12.0*  PLT 168  --  154    Date Time HL Rate/Comment 6/22 0527 0.77 SUPRAtherapeutic@1500  un/hr 6/22 1320 0.66 Therapeutic x 1@1400  un/hr 6/22 1928 0.61 Therapeutic x 2    PLAN: Continue heparin  infusion at 1400 units/hour. Check heparin  level with AM labs Monitor CBC daily while on heparin  infusion.  Will M. Lenon, PharmD Clinical Pharmacist 09/30/2023 8:05 PM

## 2023-09-30 NOTE — Progress Notes (Signed)
 Patient has multiple bradycardic episodes per central tele. Assessed patient and he was resting comfortably. No signs of acute distress. NP on call was notified.

## 2023-09-30 NOTE — Consult Note (Signed)
 Pharmacy Consult Note - Anticoagulation  Pharmacy Consult for heparin  Indication: PAD  PATIENT MEASUREMENTS: Height: 6' 2 (188 cm) Weight: 89.4 kg (197 lb) IBW/kg (Calculated) : 82.2 HEPARIN  DW (KG): 89.4  VITAL SIGNS: Temp: 97.7 F (36.5 C) (06/22 0826) Temp Source: Oral (06/22 0339) BP: 124/52 (06/22 0826) Pulse Rate: 40 (06/22 0830)  Recent Labs    09/29/23 1759 09/29/23 1759 09/29/23 2039 09/29/23 2256 09/30/23 0527 09/30/23 1320  HGB 12.7*  --   --   --  12.0*  --   HCT 37.3*  --   --   --  35.4*  --   PLT 168  --   --   --  154  --   APTT  --   --  31  --   --   --   LABPROT  --   --  14.5  --   --   --   INR  --   --  1.1  --   --   --   HEPARINUNFRC  --    < > <0.10*  --  0.77* 0.66  CREATININE 0.92  --   --   --  0.83  --   CKTOTAL 165  --   --   --   --   --   TROPONINIHS 7  --   --  9  --   --    < > = values in this interval not displayed.    Estimated Creatinine Clearance: 96.3 mL/min (by C-G formula based on SCr of 0.83 mg/dL).  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)    CAD in native artery    a.) LHC/PCI 08/09/2014 (setting of STEMI): 30% mLAD, 99% pLCx (3.0 x 15 mm Resolute Integrity DES x 1 with 10% residual post-intervention stenosis), 45% mLCx, 20% pRCA, 40% dRCA); b.) LHC 01/29/2018: 30% mLAD, 45% mLCx, 50% pRCA, 80% dRCA --> med mgmt   CHF (congestive heart failure) (HCC)    DDD (degenerative disc disease), lumbar    Diastolic dysfunction    a.) TTE 08/11/2014 (s/p STEMI): EF 45-50%, inf HK, AoV sclerosis, G1DD; b.) TTE 02/28/2018: EF 55-60%, G1DD   History of bilateral cataract extraction 2023   HTN (hypertension)    Hyperlipidemia    Long term current use of antithrombotics/antiplatelets    a.) clopidogrel    ST elevation myocardial infarction (STEMI) of true posterior wall (HCC) 08/09/2014   a.) LHC/PCI 08/09/2014: 3.0 x 15 mm Resolute Integrity DES x 1  pLCx with 10% residual post-intervention stenosis)   Unstable angina [facility])     ASSESSMENT: 68 y.o. male with PMH including PAD, CAD, vascular surgery is presenting with RLE pain. Patient was started on apixaban  and clopidogrel  by vascular surgery but has been off apixaban  for several months per chart review due to affordability/coverage. Pharmacy has been consulted to initiate and manage heparin  intravenous infusion. Additional baseline labs have been ordered.  Pertinent medications: Clopidogrel  75mg  daily  Goal(s) of therapy: Heparin  level 0.3 - 0.7 units/mL Monitor platelets by anticoagulation protocol: Yes   Baseline anticoagulation labs: Recent Labs    09/29/23 1759 09/29/23 2039 09/30/23 0527  APTT  --  31  --   INR  --  1.1  --   HGB 12.7*  --  12.0*  PLT 168  --  154    Date Time HL Rate/Comment 6/22 0527 0.77 SUPRAtherapeutic@1500  un/hr 6/22 1320  0.66 Therapeutic x 1@1400  un/hr    PLAN: Continue heparin  infusion at 1400 units/hour. Check confirmatory heparin  level in 6 hours, then daily once at least two levels are consecutively therapeutic. Monitor CBC daily while on heparin  infusion.   Barney Gertsch A Sidda Humm, PharmD Clinical Pharmacist 09/30/2023 1:38 PM

## 2023-09-30 NOTE — Plan of Care (Signed)

## 2023-09-30 NOTE — Progress Notes (Signed)
 PROGRESS NOTE    Aaron Knox  FMW:[id number] DOB: 02-01-1956 DOA: 09/29/2023 PCP: Pcp, No    Brief Narrative:  68 year old gentleman with a past medical history of coronary artery disease status post PCI and DES to proximal left circumflex,PAD status post right femoral artery endarterectomy with iliac stent placement in June 2024, hypertension, hyperlipidemia who presents with increasing right lower extremity pain and tingling progressively worse since yesterday.    Assessment and Plan: * Peripheral arterial disease (HCC) Peripheral arterial disease, progressive symptoms Has been off both antiplatelets and Eliquis  for more than 1 month Vascular surgery consult: Plan for RIGHT lower extremity angiogram tomorrow per Dr. Marea- will attempt to open occluded stents- however, I explained that if this is unsuccessful, he may require open operative intervention.    Essential hypertension Patient reports his blood pressures in the 120s at home after recently increasing his dosage of metoprolol  to 50 mg-monitor heart rate  Aortic atherosclerosis (HCC) Aortic atherosclerosis Continue atorvastatin  80 mg   Esophageal lesion Seen on the patient's CT, needs further investigation needs close interval follow-up with gastroenterology as outpatient continue his PPI :Stable asymmetric soft tissue involving the anterior, distal esophagus since remote prior CT examination of 10/31/2022 and may represent a mass such as a leiomyoma, epiphrenic diverticulum, or complex cystic collection such as an esophageal duplication cyst.    Nicotine  abuse Encourage cessation   Iliac aneurysm (HCC) Noted in his last vascular surgery note will defer to vascular surgery   CAD (coronary artery disease) Denies any chest pain although his had progressive dyspnea as well documented in his pulmonary note we will obtain an ECG and a troponin to ensure no acute cardiac events  Hypokalemia -Repleted    DVT  prophylaxis:     Code Status: Full Code   Disposition Plan:  Level of care: Telemetry Surgical Status is: Inpatient     Consultants:  Vascular surgery  Subjective: Was told by vascular surgery plan is for surgery tomorrow  Objective: Vitals:   09/29/23 2159 09/30/23 0339 09/30/23 0826 09/30/23 0830  BP: (!) 152/68 117/61 (!) 124/52   Pulse: (!) 57 (!) 51 (!) 39 (!) 40  Resp: 16 16 19    Temp: 98 F (36.7 C) 97.7 F (36.5 C) 97.7 F (36.5 C)   TempSrc: Oral Oral    SpO2: 98% 96% 97% 96%  Weight:      Height:       No intake or output data in the 24 hours ending 09/30/23 1052 Filed Weights   09/29/23 1724  Weight: 89.4 kg    Examination:   General: Appearance:     Overweight male in no acute distress     Lungs:     Clear to auscultation bilaterally, respirations unlabored  Heart:    Bradycardic. Normal rhythm. No murmurs, rubs, or gallops.    MS:   All extremities are intact.    Neurologic:   Awake, alert       Data Reviewed: I have personally reviewed following labs and imaging studies  CBC: Recent Labs  Lab 09/29/23 1759 09/30/23 0527  WBC 6.1 5.4  NEUTROABS 4.3  --   HGB 12.7* 12.0*  HCT 37.3* 35.4*  MCV 92.1 93.4  PLT 168 154   Basic Metabolic Panel: Recent Labs  Lab 09/29/23 1759 09/30/23 0527  NA 137 137  K 3.3* 3.6  CL 107 107  CO2 23 25  GLUCOSE 102* 95  BUN 13 12  CREATININE 0.92 0.83  CALCIUM   9.0 8.4*  MG 2.2  --    GFR: Estimated Creatinine Clearance: 96.3 mL/min (by C-G formula based on SCr of 0.83 mg/dL). Liver Function Tests: Recent Labs  Lab 09/29/23 1759 09/30/23 0527  AST 29 20  ALT 24 21  ALKPHOS 74 72  BILITOT 0.8 0.7  PROT 6.4* 6.1*  ALBUMIN 3.9 3.5   No results for input(s): LIPASE, AMYLASE in the last 168 hours. No results for input(s): AMMONIA in the last 168 hours. Coagulation Profile: Recent Labs  Lab 09/29/23 2039  INR 1.1   Cardiac Enzymes: Recent Labs  Lab 09/29/23 1759  CKTOTAL  165   BNP (last 3 results) No results for input(s): PROBNP in the last 8760 hours. HbA1C: Recent Labs    09/29/23 1759  HGBA1C 5.3   CBG: No results for input(s): GLUCAP in the last 168 hours. Lipid Profile: No results for input(s): CHOL, HDL, LDLCALC, TRIG, CHOLHDL, LDLDIRECT in the last 72 hours. Thyroid Function Tests: No results for input(s): TSH, T4TOTAL, FREET4, T3FREE, THYROIDAB in the last 72 hours. Anemia Panel: No results for input(s): VITAMINB12, FOLATE, FERRITIN, TIBC, IRON, RETICCTPCT in the last 72 hours. Sepsis Labs: No results for input(s): PROCALCITON, LATICACIDVEN in the last 168 hours.  No results found for this or any previous visit (from the past 240 hours).       Radiology Studies: CT Angio Aortobifemoral W and/or Wo Contrast Result Date: 09/29/2023 CLINICAL DATA:  Right leg pain, peripheral vascular disease EXAM: CT ANGIOGRAPHY OF ABDOMINAL AORTA WITH ILIOFEMORAL RUNOFF TECHNIQUE: Multidetector CT imaging of the abdomen, pelvis and lower extremities was performed using the standard protocol during bolus administration of intravenous contrast. Multiplanar CT image reconstructions and MIPs were obtained to evaluate the vascular anatomy. RADIATION DOSE REDUCTION: This exam was performed according to the departmental dose-optimization program which includes automated exposure control, adjustment of the mA and/or kV according to patient size and/or use of iterative reconstruction technique. CONTRAST:  OMNIPAQUE  IOHEXOL  350 MG/ML SOLN COMPARISON:  01/12/2023 FINDINGS: VASCULAR Aorta: Extensive mixed atherosclerotic plaque. No hemodynamically significant stenosis. No aneurysm or dissection. Celiac: Patent without evidence of aneurysm, dissection, vasculitis or significant stenosis. SMA: Patent without evidence of aneurysm, dissection, vasculitis or significant stenosis. Renals: Dual renal arteries bilaterally. Widely patent  without evidence of hemodynamically significant stenosis. Slightly irregular appearance of the right superior renal artery diffusely likely relates to atherosclerotic disease. Otherwise normal vascular morphology. No aneurysm or dissection. IMA: Patent without evidence of aneurysm, dissection, vasculitis or significant stenosis. RIGHT Lower Extremity Inflow: Right common and external iliac artery stenting again noted with chronic occlusion of the external iliac artery stent. Internal iliac artery is heavily disease but is patent at its origin. Outflow: Common femoral artery is stented and chronically occluded. Reconstitution of the superficial femoral artery via the corona mortis. Distally, the superficial femoral artery is diffusely moderately diseased with multifocal hemodynamically significant stenoses, most severe greater than 75% proximally. Multifocal stenosis of the superficial femoral artery appears progressive since prior examination. Profundus femoral artery is proximally occluded but reconstituted via muscular collaterals. There is hypoperfusion of the right lower extremity with delayed and diminished overall arterial opacification. Runoff: Popliteal artery is widely patent. Normal trifurcation with patency of the runoff vessels proximally. Patency distally beyond the level of the distal diaphysis of the tibia is not well assessed due to under filling. LEFT Lower Extremity Inflow: Diffusely diseased without evidence of hemodynamically significant stenosis involving the common and external iliac arteries. Internal iliac artery is moderately diseased  but is patent proximally. Outflow: Common femoral, superficial femoral, and profundus femoral arteries are patent without evidence of hemodynamically significant stenosis. Variant anatomy with the third perforating muscular arterial branch of left lower extremity arising from the superficial femoral artery. Runoff: Popliteal artery is widely patent. Normal  trifurcation. Occlusion of the anterior tibial artery at the level of the proximal diaphysis. Two vessel runoff to the left ankle with continuation of the posterior tibial artery to form the plantar arch and reconstitution of the dorsalis pedis artery via the peroneal artery. Veins: Not well assessed due to arterial phase enhancement. Morphologically unremarkable. Review of the MIP images confirms the above findings. NON-VASCULAR Lower chest: Asymmetric soft tissue involving the anterior, distal esophagus (39/5) appears stable since remote prior CT examination of 10/31/2022 and may represent a mass such as a leiomyoma, epiphrenic diverticulum, or complex cystic collection such as an esophageal duplication cyst. No acute abnormality. Hepatobiliary: No focal liver abnormality is seen. No gallstones, gallbladder wall thickening, or biliary dilatation. Pancreas: Unremarkable Spleen: Unremarkable Adrenals/Urinary Tract: Adrenal glands are unremarkable. Simple cortical cysts are seen within the right kidney for which no follow-up imaging is recommended. The kidneys are otherwise unremarkable. Stable congenital right posterolateral bladder diverticulum. Bladder otherwise. Stomach/Bowel: Stomach is within normal limits. Appendix appears normal. No evidence of bowel wall thickening, distention, or inflammatory changes. Lymphatic: No pathologic adenopathy within the abdomen and pelvis Reproductive: Prostate is unremarkable. Other: Postsurgical changes noted within the right inguinal region. Tiny fat containing umbilical hernia Musculoskeletal: No acute bone abnormality. No lytic or blastic bone lesion. Osseous structures are age appropriate. IMPRESSION: 1. 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 Reconstitution of the right superficial femoral artery via the corona mortis with multifocal hemodynamically significant stenoses, most severe greater than 75% proximally.  There is hypoperfusion of the right lower extremity with delayed and diminished arterial opacification. These findings appear similar since prior examination 01/12/2023, though the degree of relative hypoperfusion of the right lower extremity has progressed due to progressive disease within the reconstituted right superficial artery. 2. Three-vessel runoff at least to the level of the distal diaphysis of the right lower extremity. Patency distally is not well assessed due to filling. 3. Two vessel runoff to the left ankle with occlusion of the left anterior tibial artery at the level of the proximal diaphysis. 4. Stable asymmetric soft tissue involving the anterior, distal esophagus since remote prior CT examination of 10/31/2022 and may represent a mass such as a leiomyoma, epiphrenic diverticulum, or complex cystic collection such as an esophageal duplication cyst. 5. Aortic atherosclerosis. Aortic Atherosclerosis (ICD10-I70.0). Electronically Signed   By: Dorethia Molt M.D.   On: 09/29/2023 19:59        Scheduled Meds:  atorvastatin   80 mg Oral q1800   isosorbide  mononitrate  30 mg Oral Daily   metoprolol  succinate  50 mg Oral Daily   nicotine   7 mg Transdermal Daily   pantoprazole   20 mg Oral Daily   Continuous Infusions:  heparin  1,400 Units/hr (09/30/23 0756)     LOS: 1 day    Time spent: 45 minutes spent on chart review, discussion with nursing staff, consultants, updating family and interview/physical exam; more than 50% of that time was spent in counseling and/or coordination of care.    Harlene RAYMOND Bowl, DO [facility] Available via Epic secure chat 7am-7pm After these hours, please refer to coverage provider listed on [email or web address] 09/30/2023, 10:52 AM

## 2023-09-30 NOTE — Consult Note (Signed)
 [facility] Vascular Consult Note  MRN : [id number]  Aaron Knox is a 68 y.o. (11-03-1955) male who presents with chief complaint of  Chief Complaint  Patient presents with   Claudication    PAD  .  History of Present Illness: Aaron Knox is a 68 year old male well-known to the service history of bilateral lower extremity interventions.  Most recently in 2024 with right lower extremity common femoral endarterectomy and right iliac stenting secondary to progressive ischemia.  We last evaluated him in May and he was without complaint with ABI of 1 on the right.  He is not on anticoagulation and he is unsure when he took Eliquis  or Plavix .  He presents with worsening right lower extremity pain.  The patient states that approximately 3 days ago he developed mild right lower extremity pain that progressed in severity.  The patient states that it was very much consistent with his prior symptomology before his last intervention.  He states that the pain is constant and worse at night and with ambulation.  He denies numbness in the foot.  He denies chest pain although he has a history of coronary artery disease with a history of PCI and DES in 2016.  The patient continues to smoke approximately a pack to a pack and a half daily.  Current Facility-Administered Medications  Medication Dose Route Frequency Provider Last Rate Last Admin   atorvastatin  (LIPITOR ) tablet 80 mg  80 mg Oral q1800 Core, Prentice BROCKS, MD       heparin  ADULT infusion 100 units/mL (25000 units/250mL)  1,400 Units/hr Intravenous Continuous Nazari, Walid A, RPH 14 mL/hr at 09/30/23 0756 1,400 Units/hr at 09/30/23 0756   isosorbide  mononitrate (IMDUR ) 24 hr tablet 30 mg  30 mg Oral Daily Core, Prentice BROCKS, MD   30 mg at 09/29/23 2332   metoprolol  succinate (TOPROL -XL) 24 hr tablet 50 mg  50 mg Oral Daily Core, Prentice BROCKS, MD   50 mg at 09/30/23 9090   nicotine  (NICODERM CQ  - dosed in mg/24 hr) patch 7 mg  7 mg  Transdermal Daily Core, Prentice BROCKS, MD       nicotine  polacrilex (NICORETTE ) gum 2 mg  2 mg Oral Q2H PRN Core, Prentice BROCKS, MD       oxyCODONE  (Oxy IR/ROXICODONE ) immediate release tablet 10 mg  10 mg Oral Q4H PRN Core, Prentice BROCKS, MD       oxyCODONE  (Oxy IR/ROXICODONE ) immediate release tablet 5 mg  5 mg Oral Q4H PRN Core, Prentice BROCKS, MD   5 mg at 09/29/23 2128   pantoprazole  (PROTONIX ) EC tablet 20 mg  20 mg Oral Daily Core, Prentice BROCKS, MD   20 mg at 09/30/23 0909    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)    CAD in native artery    a.) LHC/PCI 08/09/2014 (setting of STEMI): 30% mLAD, 99% pLCx (3.0 x 15 mm Resolute Integrity DES x 1 with 10% residual post-intervention stenosis), 45% mLCx, 20% pRCA, 40% dRCA); b.) LHC 01/29/2018: 30% mLAD, 45% mLCx, 50% pRCA, 80% dRCA --> med mgmt   CHF (congestive heart failure) (HCC)    DDD (degenerative disc disease), lumbar    Diastolic dysfunction    a.) TTE 08/11/2014 (s/p STEMI): EF 45-50%, inf HK, AoV sclerosis, G1DD; b.) TTE 02/28/2018: EF 55-60%, G1DD   History of bilateral cataract extraction 2023   HTN (hypertension)    Hyperlipidemia  Long term current use of antithrombotics/antiplatelets    a.) clopidogrel    ST elevation myocardial infarction (STEMI) of true posterior wall (HCC) 08/09/2014   a.) LHC/PCI 08/09/2014: 3.0 x 15 mm Resolute Integrity DES x 1 pLCx with 10% residual post-intervention stenosis)   Unstable angina [facility])     Past Surgical History:  Procedure Laterality Date   CARDIAC CATHETERIZATION N/A 08/09/2014   Procedure: Left Heart Cath and Coronary Angiography;  Surgeon: Deatrice DELENA Cage, MD;  Location: [facility];  Service: Cardiovascular;  Laterality: N/A;   CARDIAC CATHETERIZATION N/A 08/09/2014   Procedure: Coronary Stent Intervention;  Surgeon: Deatrice DELENA Cage, MD;  Location: [facility] CV [facility];  Service: Cardiovascular;  Laterality: N/A;   CATARACT  EXTRACTION W/ INTRAOCULAR LENS  IMPLANT, BILATERAL Bilateral 2023   CORONARY ANGIOPLASTY  08/09/2014   ENDARTERECTOMY FEMORAL Right 09/20/2022   Procedure: ENDARTERECTOMY FEMORAL;  Surgeon: Marea Selinda RAMAN, MD;  Location: [facility] ORS;  Service: Vascular;  Laterality: Right;   HAND SURGERY Left 1980   bone chip with nerve damage   INSERTION OF ILIAC STENT Right 09/20/2022   Procedure: INSERTION OF ILIAC STENT;  Surgeon: Marea Selinda RAMAN, MD;  Location: [facility] ORS;  Service: Vascular;  Laterality: Right;   LEFT HEART CATH AND CORONARY ANGIOGRAPHY N/A 01/29/2018   Procedure: LEFT HEART CATH AND CORONARY ANGIOGRAPHY;  Surgeon: Mady Bruckner, MD;  Location: [facility] INV[facility]IVE CV LAB;  Service: Cardiovascular;  Laterality: N/A;   LOWER EXTREMITY ANGIOGRAPHY Right 08/07/2022   Procedure: Lower Extremity Angiography;  Surgeon: Marea Selinda RAMAN, MD;  Location: [facility] INV[facility] CV [facility];  Service: Cardiovascular;  Laterality: Right;   LOWER EXTREMITY ANGIOGRAPHY Right 01/12/2023   Procedure: Lower Extremity Angiography;  Surgeon: Marea Selinda RAMAN, MD;  Location: [facility] INVAS[facility] CV LAB;  Service: Cardiovascular;  Laterality: Right;   nerve reconstruction Left 1980   hand    Social History Social History   Tobacco Use   Smoking status: Some Days    Current packs/day: 0.00    Average packs/day: 1.5 packs/day for 56.3 years (84.5 ttl pk-yrs)    Types: Cigarettes    Start date: 1968    Last attempt to quit: 08/2022    Years since quitting: 1.1   Smokeless tobacco: Never   Tobacco comments:    Smoked 1.5 ppd at his heaviest 09/12/23  Vaping Use   Vaping status: Every Day   Substances: Nicotine   Substance Use Topics   Alcohol use: Yes    Alcohol/week: 3.0 standard drinks of alcohol    Types: 3 Glasses of wine per week    Comment: weekends   Drug use: No    Family History Family History  Problem Relation Age of Onset   CAD Maternal Grandfather     Allergies  Allergen Reactions   Aspirin  Anaphylaxis   Penicillins  Other (See Comments)    Childhood allergy per pt's mother. He does not recall any details of the reaction. Willing to try beta lactams.     REVIEW OF SYSTEMS (Negative unless checked)  Constitutional: [] Weight loss  [] Fever  [] Chills Cardiac: [] Chest pain   [] Chest pressure   [] Palpitations   [] Shortness of breath when laying flat   [] Shortness of breath at rest   [] Shortness of breath with exertion. Vascular:  [x] Pain in legs with walking   [x] Pain in legs at rest   [] Pain in legs when laying flat   [x] Claudication   [] Pain in feet when walking  [] Pain in feet at rest  []   Pain in feet when laying flat   [] History of DVT   [] Phlebitis   [] Swelling in legs   [] Varicose veins   [] Non-healing ulcers Pulmonary:   [] Uses home oxygen   [] Productive cough   [] Hemoptysis   [] Wheeze  [] COPD   [] Asthma Neurologic:  [] Dizziness  [] Blackouts   [] Seizures   [] History of stroke   [] History of TIA  [] Aphasia   [] Temporary blindness   [] Dysphagia   [] Weakness or numbness in arms   [] Weakness or numbness in legs Musculoskeletal:  [] Arthritis   [] Joint swelling   [] Joint pain   [] Low back pain Hematologic:  [] Easy bruising  [] Easy bleeding   [] Hypercoagulable state   [] Anemic  [] Hepatitis Gastrointestinal:  [] Blood in stool   [] Vomiting blood  [] Gastroesophageal reflux/heartburn   [] Difficulty swallowing. Genitourinary:  [] Chronic kidney disease   [] Difficult urination  [] Frequent urination  [] Burning with urination   [] Blood in urine Skin:  [] Rashes   [] Ulcers   [] Wounds Psychological:  [] History of anxiety   []  History of major depression.  Physical Examination  Vitals:   09/29/23 2159 09/30/23 0339 09/30/23 0826 09/30/23 0830  BP: (!) 152/68 117/61 (!) 124/52   Pulse: (!) 57 (!) 51 (!) 39 (!) 40  Resp: 16 16 19    Temp: 98 F (36.7 C) 97.7 F (36.5 C) 97.7 F (36.5 C)   TempSrc: Oral Oral    SpO2: 98% 96% 97% 96%  Weight:      Height:       Body mass index is 25.29 kg/m. Gen:  WD/WN, NAD Head:  [place]/AT, No temporalis wasting. Prominent temp pulse not noted. Ear/Nose/Throat: Hearing grossly intact, nares w/o erythema or drainage, oropharynx w/o Erythema/Exudate Eyes: Sclera non-icteric, conjunctiva clear Neck: Trachea midline.  No JVD.  Pulmonary:  Good air movement, respirations not labored, equal bilaterally.  Cardiac: RRR, normal S1, S2. Vascular:  Vessel Right Left  Radial Palpable Palpable  Ulnar Palpable Palpable  Brachial Palpable Palpable  Carotid Palpable, without bruit Palpable, without bruit  Aorta Not palpable N/A  Femoral  Palpable  Popliteal    PT    DP Non palpable Palpable   Gastrointestinal: soft, non-tender/non-distended. No guarding/reflex.  Musculoskeletal: M/S 5/5 throughout.  Feet warm bilaterally.  Extremities without ischemic changes- right leg/foot warm, +motor/+sensory.  No deformity or atrophy. No edema. Neurologic: Sensation grossly intact in extremities.  Symmetrical.  Speech is fluent. Motor exam as listed above. Psychiatric: Judgment intact, Mood & affect appropriate for pt's clinical situation. Dermatologic: No rashes or ulcers noted.  No cellulitis or open wounds. Lymph : No Cervical, Axillary, or Inguinal lymphadenopathy.     CBC Lab Results  Component Value Date   WBC 5.4 09/30/2023   HGB 12.0 (L) 09/30/2023   HCT 35.4 (L) 09/30/2023   MCV 93.4 09/30/2023   PLT 154 09/30/2023    BMET    Component Value Date/Time   NA 137 09/30/2023 0527   K 3.6 09/30/2023 0527   CL 107 09/30/2023 0527   CO2 25 09/30/2023 0527   GLUCOSE 95 09/30/2023 0527   BUN 12 09/30/2023 0527   CREATININE 0.83 09/30/2023 0527   CALCIUM  8.4 (L) 09/30/2023 0527   GFRNONAA >60 09/30/2023 0527   GFRAA >60 01/30/2018 0408   Estimated Creatinine Clearance: 96.3 mL/min (by C-G formula based on SCr of 0.83 mg/dL).  COAG Lab Results  Component Value Date   INR 1.1 09/29/2023   INR 1.2 01/12/2023    Radiology CT Angio Aortobifemoral W and/or Wo  Contrast Result Date: 09/29/2023 CLINICAL DATA:  Right leg pain, peripheral vascular disease EXAM: CT ANGIOGRAPHY OF ABDOMINAL AORTA WITH ILIOFEMORAL RUNOFF TECHNIQUE: Multidetector CT imaging of the abdomen, pelvis and lower extremities was performed using the standard protocol during bolus administration of intravenous contrast. Multiplanar CT image reconstructions and MIPs were obtained to evaluate the vascular anatomy. RADIATION DOSE REDUCTION: This exam was performed according to the departmental dose-optimization program which includes automated exposure control, adjustment of the mA and/or kV according to patient size and/or use of iterative reconstruction technique. CONTRAST:  OMNIPAQUE  IOHEXOL  350 MG/ML SOLN COMPARISON:  01/12/2023 FINDINGS: VASCULAR Aorta: Extensive mixed atherosclerotic plaque. No hemodynamically significant stenosis. No aneurysm or dissection. Celiac: Patent without evidence of aneurysm, dissection, vasculitis or significant stenosis. SMA: Patent without evidence of aneurysm, dissection, vasculitis or significant stenosis. Renals: Dual renal arteries bilaterally. Widely patent without evidence of hemodynamically significant stenosis. Slightly irregular appearance of the right superior renal artery diffusely likely relates to atherosclerotic disease. Otherwise normal vascular morphology. No aneurysm or dissection. IMA: Patent without evidence of aneurysm, dissection, vasculitis or significant stenosis. RIGHT Lower Extremity Inflow: Right common and external iliac artery stenting again noted with chronic occlusion of the external iliac artery stent. Internal iliac artery is heavily disease but is patent at its origin. Outflow: Common femoral artery is stented and chronically occluded. Reconstitution of the superficial femoral artery via the corona mortis. Distally, the superficial femoral artery is diffusely moderately diseased with multifocal hemodynamically significant stenoses,  most severe greater than 75% proximally. Multifocal stenosis of the superficial femoral artery appears progressive since prior examination. Profundus femoral artery is proximally occluded but reconstituted via muscular collaterals. There is hypoperfusion of the right lower extremity with delayed and diminished overall arterial opacification. Runoff: Popliteal artery is widely patent. Normal trifurcation with patency of the runoff vessels proximally. Patency distally beyond the level of the distal diaphysis of the tibia is not well assessed due to under filling. LEFT Lower Extremity Inflow: Diffusely diseased without evidence of hemodynamically significant stenosis involving the common and external iliac arteries. Internal iliac artery is moderately diseased but is patent proximally. Outflow: Common femoral, superficial femoral, and profundus femoral arteries are patent without evidence of hemodynamically significant stenosis. Variant anatomy with the third perforating muscular arterial branch of left lower extremity arising from the superficial femoral artery. Runoff: Popliteal artery is widely patent. Normal trifurcation. Occlusion of the anterior tibial artery at the level of the proximal diaphysis. Two vessel runoff to the left ankle with continuation of the posterior tibial artery to form the plantar arch and reconstitution of the dorsalis pedis artery via the peroneal artery. Veins: Not well assessed due to arterial phase enhancement. Morphologically unremarkable. Review of the MIP images confirms the above findings. NON-VASCULAR Lower chest: Asymmetric soft tissue involving the anterior, distal esophagus (39/5) appears stable since remote prior CT examination of 10/31/2022 and may represent a mass such as a leiomyoma, epiphrenic diverticulum, or complex cystic collection such as an esophageal duplication cyst. No acute abnormality. Hepatobiliary: No focal liver abnormality is seen. No gallstones, gallbladder  wall thickening, or biliary dilatation. Pancreas: Unremarkable Spleen: Unremarkable Adrenals/Urinary Tract: Adrenal glands are unremarkable. Simple cortical cysts are seen within the right kidney for which no follow-up imaging is recommended. The kidneys are otherwise unremarkable. Stable congenital right posterolateral bladder diverticulum. Bladder otherwise. Stomach/Bowel: Stomach is within normal limits. Appendix appears normal. No evidence of bowel wall thickening, distention, or inflammatory changes. Lymphatic: No pathologic adenopathy within the abdomen and pelvis Reproductive: Prostate  is unremarkable. Other: Postsurgical changes noted within the right inguinal region. Tiny fat containing umbilical hernia Musculoskeletal: No acute bone abnormality. No lytic or blastic bone lesion. Osseous structures are age appropriate. IMPRESSION: 1. 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 Reconstitution of the right superficial femoral artery via the corona mortis with multifocal hemodynamically significant stenoses, most severe greater than 75% proximally. There is hypoperfusion of the right lower extremity with delayed and diminished arterial opacification. These findings appear similar since prior examination 01/12/2023, though the degree of relative hypoperfusion of the right lower extremity has progressed due to progressive disease within the reconstituted right superficial artery. 2. Three-vessel runoff at least to the level of the distal diaphysis of the right lower extremity. Patency distally is not well assessed due to filling. 3. Two vessel runoff to the left ankle with occlusion of the left anterior tibial artery at the level of the proximal diaphysis. 4. Stable asymmetric soft tissue involving the anterior, distal esophagus since remote prior CT examination of 10/31/2022 and may represent a mass such as a leiomyoma, epiphrenic diverticulum, or complex  cystic collection such as an esophageal duplication cyst. 5. Aortic atherosclerosis. Aortic Atherosclerosis (ICD10-I70.0). Electronically Signed   By: Dorethia Molt M.D.   On: 09/29/2023 19:59   LONG TERM MONITOR (3-14 DAYS) Result Date: 09/23/2023 Event monitor Patch Wear Time:  13 days and 23 hours (2025-05-01T08:33:12-398 to 2025-05-15T08:33:04-398) Normal sinus rhythm Patient had a min HR of 41 bpm, max HR of 214 bpm, and avg HR of 70 bpm. 1 run of Ventricular Tachycardia occurred lasting 5 beats with a max rate of 214 bpm (avg 196 bpm). 172 Supraventricular Tachycardia runs occurred, the run with the fastest interval lasting 4 beats with a max rate of 200 bpm, the longest lasting 34.2 secs with an avg rate of 120 bpm. Isolated SVEs were frequent (13.4%, 817001), SVE Couplets were occasional (1.9%, 13006), and SVE Triplets were rare (<1.0%, 1162). Isolated VEs were rare (<1.0%, 782), VE Couplets were rare (<1.0%, 6), and VE Triplets were rare (<1.0%, 2). Patient triggered events (20) associated with normal sinus rhythm, and PACs Signed, Velinda Lunger, MD, Ph.D [facility]   ECHOCARDIOGRAM COMPLETE Result Date: 09/13/2023    ECHOCARDIOGRAM REPORT   Patient Name:   Aaron Knox Date of Exam: 09/13/2023 Medical Rec #:  [id number]          Height:       74.0 in Accession #:    [id number]         Weight:       195.8 lb Date of Birth:  1956-01-27          BSA:          2.154 m Patient Age:    68 years           BP:           156/82 mmHg Patient Gender: M                  HR:           64 bpm. Exam Location:  [facility] Procedure: 2D Echo, Cardiac Doppler, Color Doppler and Strain Analysis (Both            Spectral and Color Flow Doppler were utilized during procedure). Indications:    R06.02 SOB  History:        Patient has prior history of Echocardiogram examinations, most  recent 01/28/2018. CAD and Previous Myocardial Infarction, PAD,                 Signs/Symptoms:Shortness of Breath;  Risk Factors:Hypertension                 and Former Smoker.  Sonographer:    Doyal Point MHA, BS, RDCS Referring Phys: [id number] CADENCE H FURTH IMPRESSIONS  1. Left ventricular ejection fraction, by estimation, is 55%. The left ventricle has normal function. The left ventricle has no regional wall motion abnormalities. Left ventricular diastolic parameters were normal.  2. Right ventricular systolic function is normal. The right ventricular size is normal.  3. The mitral valve is normal in structure. No evidence of mitral valve regurgitation.  4. The aortic valve is tricuspid. Aortic valve regurgitation is not visualized. Aortic valve sclerosis/calcification is present, without any evidence of aortic stenosis.  5. The inferior vena cava is normal in size with greater than 50% respiratory variability, suggesting right atrial pressure of 3 mmHg. FINDINGS  Left Ventricle: Left ventricular ejection fraction, by estimation, is 55%. The left ventricle has normal function. The left ventricle has no regional wall motion abnormalities. Global longitudinal strain performed but not reported based on interpreter judgement due to suboptimal tracking. The left ventricular internal cavity size was normal in size. There is no left ventricular hypertrophy. Left ventricular diastolic parameters were normal. Right Ventricle: The right ventricular size is normal. No increase in right ventricular wall thickness. Right ventricular systolic function is normal. Left Atrium: Left atrial size was normal in size. Right Atrium: Right atrial size was normal in size. Pericardium: There is no evidence of pericardial effusion. Mitral Valve: The mitral valve is normal in structure. No evidence of mitral valve regurgitation. Tricuspid Valve: The tricuspid valve is normal in structure. Tricuspid valve regurgitation is not demonstrated. Aortic Valve: The aortic valve is tricuspid. Aortic valve regurgitation is not visualized. Aortic valve  sclerosis/calcification is present, without any evidence of aortic stenosis. Aortic valve mean gradient measures 2.0 mmHg. Aortic valve peak gradient measures 4.2 mmHg. Aortic valve area, by VTI measures 3.72 cm. Pulmonic Valve: The pulmonic valve was not well visualized. Pulmonic valve regurgitation is not visualized. Aorta: The aortic root is normal in size and structure. Venous: The inferior vena cava is normal in size with greater than 50% respiratory variability, suggesting right atrial pressure of 3 mmHg. IAS/Shunts: No atrial level shunt detected by color flow Doppler.  LEFT VENTRICLE PLAX 2D LVIDd:         5.10 cm   Diastology LVIDs:         4.33 cm   LV e' medial:    7.18 cm/s LV PW:         0.81 cm   LV E/e' medial:  10.4 LV IVS:        1.06 cm   LV e' lateral:   8.38 cm/s LVOT diam:     2.20 cm   LV E/e' lateral: 8.9 LV SV:         89 LV SV Index:   41 LVOT Area:     3.80 cm  RIGHT VENTRICLE RV S prime:     13.80 cm/s TAPSE (M-mode): 2.5 cm LEFT ATRIUM             Index LA diam:        4.70 cm 2.18 cm/m LA Vol (A2C):   67.3 ml 31.24 ml/m LA Vol (A4C):   61.1 ml 28.37 ml/m LA Biplane  Vol: 64.3 ml 29.85 ml/m  AORTIC VALVE AV Area (Vmax):    3.51 cm AV Area (Vmean):   3.40 cm AV Area (VTI):     3.72 cm AV Vmax:           102.00 cm/s AV Vmean:          66.900 cm/s AV VTI:            0.239 m AV Peak Grad:      4.2 mmHg AV Mean Grad:      2.0 mmHg LVOT Vmax:         94.30 cm/s LVOT Vmean:        59.900 cm/s LVOT VTI:          0.234 m LVOT/AV VTI ratio: 0.98  AORTA Ao Sinus diam: 3.33 cm MITRAL VALVE MV Area (PHT): 3.21 cm    SHUNTS MV Decel Time: 236 msec    Systemic VTI:  0.23 m MV E velocity: 74.80 cm/s  Systemic Diam: 2.20 cm MV A velocity: 66.50 cm/s MV E/A ratio:  1.12 Redell Cave MD Electronically signed by Redell Cave MD Signature Date/Time: 09/13/2023/3:10:23 PM    Final       Assessment/Plan 1. Right lower extremity Ischemia- Occlusion of RIGHT Iliac stents 2. Heparin  gtt 3.  Extensive discussion with patient- will plan for RIGHT lower extremity angiogram tomorrow per Dr. Marea- will attempt to open occluded stents- however, I explained that if this is unsuccessful, he may require open operative intervention. 4. NPO after MN   Tisa Curry LABOR, MD  09/30/2023 9:44 AM    This note was created with Dragon medical transcription system.  Any error is purely unintentional

## 2023-09-30 NOTE — Consult Note (Addendum)
 Pharmacy Consult Note - Anticoagulation  Pharmacy Consult for heparin  Indication: PAD  PATIENT MEASUREMENTS: Height: 6' 2 (188 cm) Weight: 89.4 kg (197 lb) IBW/kg (Calculated) : 82.2 HEPARIN  DW (KG): 89.4  VITAL SIGNS: Temp: 97.7 F (36.5 C) (06/22 0339) Temp Source: Oral (06/22 0339) BP: 117/61 (06/22 0339) Pulse Rate: 51 (06/22 0339)  Recent Labs    09/29/23 1759 09/29/23 1759 09/29/23 2039 09/29/23 2256 09/30/23 0527  HGB 12.7*  --   --   --  12.0*  HCT 37.3*  --   --   --  35.4*  PLT 168  --   --   --  154  APTT  --   --  31  --   --   LABPROT  --   --  14.5  --   --   INR  --   --  1.1  --   --   HEPARINUNFRC  --    < > <0.10*  --  0.77*  CREATININE 0.92  --   --   --  0.83  CKTOTAL 165  --   --   --   --   TROPONINIHS 7  --   --  9  --    < > = values in this interval not displayed.    Estimated Creatinine Clearance: 96.3 mL/min (by C-G formula based on SCr of 0.83 mg/dL).  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)    CAD in native artery    a.) LHC/PCI 08/09/2014 (setting of STEMI): 30% mLAD, 99% pLCx (3.0 x 15 mm Resolute Integrity DES x 1 with 10% residual post-intervention stenosis), 45% mLCx, 20% pRCA, 40% dRCA); b.) LHC 01/29/2018: 30% mLAD, 45% mLCx, 50% pRCA, 80% dRCA --> med mgmt   CHF (congestive heart failure) (HCC)    DDD (degenerative disc disease), lumbar    Diastolic dysfunction    a.) TTE 08/11/2014 (s/p STEMI): EF 45-50%, inf HK, AoV sclerosis, G1DD; b.) TTE 02/28/2018: EF 55-60%, G1DD   History of bilateral cataract extraction 2023   HTN (hypertension)    Hyperlipidemia    Long term current use of antithrombotics/antiplatelets    a.) clopidogrel    ST elevation myocardial infarction (STEMI) of true posterior wall (HCC) 08/09/2014   a.) LHC/PCI 08/09/2014: 3.0 x 15 mm Resolute Integrity DES x 1 pLCx with 10% residual post-intervention stenosis)   Unstable  angina [facility])     ASSESSMENT: 68 y.o. male with PMH including PAD, CAD, vascular surgery is presenting with RLE pain. Patient was started on apixaban  and clopidogrel  by vascular surgery but has been off apixaban  for several months per chart review due to affordability/coverage. Pharmacy has been consulted to initiate and manage heparin  intravenous infusion. Additional baseline labs have been ordered.  Pertinent medications: Clopidogrel  75mg  daily  Goal(s) of therapy: Heparin  level 0.3 - 0.7 units/mL Monitor platelets by anticoagulation protocol: Yes   Baseline anticoagulation labs: Recent Labs    09/29/23 1759 09/29/23 2039 09/30/23 0527  APTT  --  31  --   INR  --  1.1  --   HGB 12.7*  --  12.0*  PLT 168  --  154    Date Time HL Rate/Comment 6/22 0527 0.77 SUPRAtherapeutic@1500  un/hr    PLAN: Decrease heparin  infusion to 1400 units/hour. Check heparin  level in 6 hours, then daily once at least two levels are consecutively therapeutic. Monitor CBC daily while on heparin   infusion.   Myranda Pavone A Trulee Hamstra, PharmD Clinical Pharmacist 09/30/2023 7:16 AM

## 2023-10-01 ENCOUNTER — Encounter
Admission: EM | Discharge status: Home / Self Care | Payer: Self-pay | Source: Home / Self Care | Attending: Internal Medicine

## 2023-10-01 DIAGNOSIS — I2510 Atherosclerotic heart disease of native coronary artery without angina pectoris: Secondary | ICD-10-CM | POA: Diagnosis not present

## 2023-10-01 DIAGNOSIS — E876 Hypokalemia: Secondary | ICD-10-CM

## 2023-10-01 DIAGNOSIS — I723 Aneurysm of iliac artery: Secondary | ICD-10-CM

## 2023-10-01 DIAGNOSIS — I700 Atherosclerosis of aorta: Secondary | ICD-10-CM

## 2023-10-01 DIAGNOSIS — Z720 Tobacco use: Secondary | ICD-10-CM

## 2023-10-01 DIAGNOSIS — I70221 Atherosclerosis of native arteries of extremities with rest pain, right leg: Secondary | ICD-10-CM

## 2023-10-01 DIAGNOSIS — K229 Disease of esophagus, unspecified: Secondary | ICD-10-CM

## 2023-10-01 DIAGNOSIS — I739 Peripheral vascular disease, unspecified: Secondary | ICD-10-CM | POA: Diagnosis not present

## 2023-10-01 DIAGNOSIS — T82856A Stenosis of peripheral vascular stent, initial encounter: Secondary | ICD-10-CM

## 2023-10-01 DIAGNOSIS — T82868A Thrombosis of vascular prosthetic devices, implants and grafts, initial encounter: Principal | ICD-10-CM

## 2023-10-01 DIAGNOSIS — I10 Essential (primary) hypertension: Secondary | ICD-10-CM | POA: Diagnosis not present

## 2023-10-01 LAB — BASIC METABOLIC PANEL WITH GFR
Anion gap: 5 (ref 5–15)
BUN: 15 mg/dL (ref 8–23)
CO2: 24 mmol/L (ref 22–32)
Calcium: 8.6 mg/dL — ABNORMAL LOW (ref 8.9–10.3)
Chloride: 106 mmol/L (ref 98–111)
Creatinine, Ser: 0.82 mg/dL (ref 0.61–1.24)
GFR, Estimated: >60 mL/min (ref >60)
Glucose, Bld: 94 mg/dL (ref 70–99)
Potassium: 3.7 mmol/L (ref 3.5–5.1)
Sodium: 135 mmol/L (ref 135–145)

## 2023-10-01 LAB — CBC
HCT: 35.8 % — ABNORMAL LOW (ref 39.0–52.0)
Hemoglobin: 11.9 g/dL — ABNORMAL LOW (ref 13.0–17.0)
MCH: 30.8 pg (ref 26.0–34.0)
MCHC: 33.2 g/dL (ref 30.0–36.0)
MCV: 92.7 fL (ref 80.0–100.0)
Platelets: 165 10*3/uL (ref 150–400)
RBC: 3.86 MIL/uL — ABNORMAL LOW (ref 4.22–5.81)
RDW: 13 % (ref 11.5–15.5)
WBC: 5.7 10*3/uL (ref 4.0–10.5)
nRBC: 0 % (ref 0.0–0.2)

## 2023-10-01 LAB — HEPARIN LEVEL (UNFRACTIONATED): Heparin Unfractionated: 0.69 [IU]/mL (ref 0.30–0.70)

## 2023-10-01 SURGERY — LOWER EXTREMITY ANGIOGRAPHY
Anesthesia: Moderate Sedation | Laterality: Right

## 2023-10-01 MED ORDER — MIDAZOLAM HCL 2 MG/2ML IJ SOLN
INTRAMUSCULAR | Status: DC | PRN
  Administered 2023-10-01 (×2): .5 mg via INTRAVENOUS
  Administered 2023-10-01: 1 mg via INTRAVENOUS
  Administered 2023-10-01: .5 mg via INTRAVENOUS
  Administered 2023-10-01: 2 mg via INTRAVENOUS

## 2023-10-01 MED ORDER — HEPARIN (PORCINE) IN NACL 1000-0.9 UT/500ML-% IV SOLN
INTRAVENOUS | Status: DC | PRN
  Administered 2023-10-01: 1000 mL

## 2023-10-01 MED ORDER — HEPARIN SODIUM (PORCINE) 10000 UNIT/ML IJ SOLN
INTRAMUSCULAR | Status: DC | PRN
  Administered 2023-10-01: 5000 [IU]

## 2023-10-01 MED ORDER — HEPARIN SODIUM (PORCINE) 1000 UNIT/ML IJ SOLN
INTRAMUSCULAR | Status: AC
  Filled 2023-10-01: qty 10

## 2023-10-01 MED ORDER — FENTANYL CITRATE (PF) 100 MCG/2ML IJ SOLN
INTRAMUSCULAR | Status: AC
  Filled 2023-10-01: qty 2

## 2023-10-01 MED ORDER — CEFAZOLIN SODIUM-DEXTROSE 2-4 GM/100ML-% IV SOLN
2.0000 g | Freq: Once | INTRAVENOUS | Status: DC
  Administered 2023-10-01: 2 g via INTRAVENOUS

## 2023-10-01 MED ORDER — LIDOCAINE-EPINEPHRINE (PF) 1 %-1:200000 IJ SOLN
INTRAMUSCULAR | Status: DC | PRN
  Administered 2023-10-01: 10 mL via INTRADERMAL

## 2023-10-01 MED ORDER — FENTANYL CITRATE (PF) 100 MCG/2ML IJ SOLN
INTRAMUSCULAR | Status: DC | PRN
  Administered 2023-10-01 (×3): 25 ug via INTRAVENOUS
  Administered 2023-10-01: 50 ug via INTRAVENOUS
  Administered 2023-10-01: 25 ug via INTRAVENOUS

## 2023-10-01 MED ORDER — IODIXANOL 320 MG/ML IV SOLN
INTRAVENOUS | Status: DC | PRN
  Administered 2023-10-01: 60 mL via INTRA_ARTERIAL

## 2023-10-01 MED ORDER — MIDAZOLAM HCL 5 MG/5ML IJ SOLN
INTRAMUSCULAR | Status: AC
  Filled 2023-10-01: qty 5

## 2023-10-01 MED ORDER — CEFAZOLIN SODIUM-DEXTROSE 2-4 GM/100ML-% IV SOLN
INTRAVENOUS | Status: AC
  Filled 2023-10-01: qty 100

## 2023-10-01 SURGICAL SUPPLY — 23 items
BALLOON LUTONIX 018 5X300X130 (BALLOONS) IMPLANT
BALLOON LUTONIX 018 6X100X130 (BALLOONS) IMPLANT
BALLOON LUTONIX 7X80X130 (BALLOONS) IMPLANT
BALLOON ULTRVRSE 8X80X75 (BALLOONS) IMPLANT
CANISTER PENUMBRA ENGINE (MISCELLANEOUS) IMPLANT
CATH ANGIO 5F PIGTAIL 65CM (CATHETERS) IMPLANT
CATH BEACON 5 .035 65 RIM TIP (CATHETERS) IMPLANT
CATH LIGHTNING BOLT 7 130 (CATHETERS) IMPLANT
CATH VS15FR (CATHETERS) IMPLANT
COVER PROBE ULTRASOUND 5X96 (MISCELLANEOUS) IMPLANT
DEVICE PRESTO INFLATION (MISCELLANEOUS) IMPLANT
DEVICE VASC CLSR CELT ART 7 (Vascular Products) IMPLANT
GLIDEWIRE ADV .035X260CM (WIRE) IMPLANT
PACK ANGIOGRAPHY (CUSTOM PROCEDURE TRAY) ×1 IMPLANT
SHEATH BRITE TIP 5FRX11 (SHEATH) IMPLANT
SHEATH BRITE TIP 7FRX11 (SHEATH) IMPLANT
SHEATH FLEXOR ANSEL2 7FRX45 (SHEATH) IMPLANT
STENT VIABAHN 7X7.5X120 (Permanent Stent) IMPLANT
STENT VIABAHN 8X7.5X120 (Permanent Stent) IMPLANT
SYR MEDRAD MARK 7 150ML (SYRINGE) IMPLANT
TUBING CONTRAST HIGH PRESS 72 (TUBING) IMPLANT
WIRE G V18X300CM (WIRE) IMPLANT
WIRE J 3MM .035X145CM (WIRE) IMPLANT

## 2023-10-01 NOTE — Consult Note (Signed)
 Pharmacy Consult Note - Anticoagulation  Pharmacy Consult for heparin  Indication: PAD  PATIENT MEASUREMENTS: Height: 6' 2 (188 cm) Weight: 89.4 kg (197 lb) IBW/kg (Calculated) : 82.2 HEPARIN  DW (KG): 89.4  VITAL SIGNS: Temp: 97.9 F (36.6 C) (06/23 0325) Temp Source: Oral (06/22 2038) BP: 106/57 (06/23 0325) Pulse Rate: 50 (06/23 0325)  Recent Labs    09/29/23 1759 09/29/23 2039 09/29/23 2256 09/30/23 0527 10/01/23 0358  HGB 12.7*  --   --    < > 11.9*  HCT 37.3*  --   --    < > 35.8*  PLT 168  --   --    < > 165  APTT  --  31  --   --   --   LABPROT  --  14.5  --   --   --   INR  --  1.1  --   --   --   HEPARINUNFRC  --  <0.10*  --    < > 0.69  CREATININE 0.92  --   --    < > 0.82  CKTOTAL 165  --   --   --   --   TROPONINIHS 7  --  9  --   --    < > = values in this interval not displayed.    Estimated Creatinine Clearance: 97.5 mL/min (by C-G formula based on SCr of 0.82 mg/dL).  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)    CAD in native artery    a.) LHC/PCI 08/09/2014 (setting of STEMI): 30% mLAD, 99% pLCx (3.0 x 15 mm Resolute Integrity DES x 1 with 10% residual post-intervention stenosis), 45% mLCx, 20% pRCA, 40% dRCA); b.) LHC 01/29/2018: 30% mLAD, 45% mLCx, 50% pRCA, 80% dRCA --> med mgmt   CHF (congestive heart failure) (HCC)    DDD (degenerative disc disease), lumbar    Diastolic dysfunction    a.) TTE 08/11/2014 (s/p STEMI): EF 45-50%, inf HK, AoV sclerosis, G1DD; b.) TTE 02/28/2018: EF 55-60%, G1DD   History of bilateral cataract extraction 2023   HTN (hypertension)    Hyperlipidemia    Long term current use of antithrombotics/antiplatelets    a.) clopidogrel    ST elevation myocardial infarction (STEMI) of true posterior wall (HCC) 08/09/2014   a.) LHC/PCI 08/09/2014: 3.0 x 15 mm Resolute Integrity DES x 1 pLCx with 10% residual post-intervention stenosis)    Unstable angina [facility])     ASSESSMENT: 68 y.o. male with PMH including PAD, CAD, vascular surgery is presenting with RLE pain. Patient was started on apixaban  and clopidogrel  by vascular surgery but has been off apixaban  for several months per chart review due to affordability/coverage. Pharmacy has been consulted to initiate and manage heparin  intravenous infusion. Additional baseline labs have been ordered.  Pertinent medications: Clopidogrel  75mg  daily  Goal(s) of therapy: Heparin  level 0.3 - 0.7 units/mL Monitor platelets by anticoagulation protocol: Yes   Baseline anticoagulation labs: Recent Labs    09/29/23 1759 09/29/23 2039 09/30/23 0527 10/01/23 0358  APTT  --  31  --   --   INR  --  1.1  --   --   HGB 12.7*  --  12.0* 11.9*  PLT 168  --  154 165    Date Time HL Rate/Comment 6/22 0527 0.77 SUPRAtherapeutic@1500  un/hr 6/22 1320 0.66 Therapeutic x 1@1400  un/hr 6/22 1928 0.61 Therapeutic x 2 6/23  0358   0.69     Therapeutic X 3     PLAN: Continue heparin  infusion at 1400 units/hour. Check heparin  level with AM labs CBC daily  Jaleeyah Munce D Clinical Pharmacist 10/01/2023 5:22 AM

## 2023-10-01 NOTE — Discharge Instructions (Signed)
 Some PCP options in [place] area- not a comprehensive list  [facility]- [phone] [facility]- [phone] [facility]- [phone] [facility]- [phone] [facility]- [phone] [facility]- [phone]  or [facility] Physician Referral Line [phone]

## 2023-10-01 NOTE — Plan of Care (Signed)

## 2023-10-01 NOTE — Op Note (Signed)
 Aaron Knox  Percutaneous Study/Intervention Procedural Note   Date of Surgery: 10/01/2023  Surgeon(s):Mar Zettler    Assistants:none  Pre-operative Diagnosis: PAD with rest pain right lower extremity, acute on chronic ischemia  Post-operative diagnosis:  Same  Procedure(s) Performed:             1.  Ultrasound guidance for vascular access left femoral artery             2.  Catheter placement into right SFA from left femoral approach             3.  Aortogram and selective right lower extremity angiogram             4.  Mechanical thrombectomy of the right external iliac artery, common femoral artery, and proximal superficial femoral artery with the penumbra 7 bolt device             5.  Angioplasty of the right SFA with 5 mm diameter Lutonix drug-coated angioplasty balloon  6.  Angioplasty of the right common femoral artery with 6 mm diameter Lutonix drug-coated angioplasty balloon  7.  Stent placement to the right external iliac artery with 8 mm diameter by 7.5 cm length Viabahn stent  8.  Stent placement to the right common femoral artery with 7 mm diameter by 7.5 cm length bone stent             9. Celt closure device left femoral artery  EBL: 50 cc  Contrast: 60 cc  Fluoro Time: 14.3 minutes  Moderate Conscious Sedation Time: approximately 79 minutes using 4.5 mg of Versed  and 150 mcg of Fentanyl               Indications:  Patient is a 68 y.o.male with acute on chronic ischemia of the right lower extremity with rest pain and a CT scan showing thrombosis of his right iliac and common femoral stents. The patient is brought in for angiography for further evaluation and potential treatment.  Due to the limb threatening nature of the situation, angiogram was performed for attempted limb salvage. The patient is aware that if the procedure fails, amputation would be expected.  The patient also understands that even with successful revascularization, amputation  may still be required due to the severity of the situation.  Risks and benefits are discussed and informed consent is obtained.   Procedure:  The patient was identified and appropriate procedural time out was performed.  The patient was then placed supine on the table and prepped and draped in the usual sterile fashion. Moderate conscious sedation was administered during a face to face encounter with the patient throughout the procedure with my supervision of the RN administering medicines and monitoring the patient's vital signs, pulse oximetry, telemetry and mental status throughout from the start of the procedure until the patient was taken to the recovery room. Ultrasound was used to evaluate the left common femoral artery.  It was patent .  A digital ultrasound image was acquired.  A Seldinger needle was used to access the left common femoral artery under direct ultrasound guidance and a permanent image was performed.  A 0.035 J wire was advanced without resistance and a 5Fr sheath was placed.  Pigtail catheter was placed into the aorta and an AP aortogram was performed. This demonstrated normal renal arteries and normal aorta.  The left iliac arteries did not appear to have any significant stenosis.  The right common iliac artery was patent as was the  right internal iliac artery, but there was an abrupt occlusion of the proximal right external iliac artery consistent with thrombosis.  This continued down through the common femoral artery with reconstitution of the superficial femoral artery.  There was a small profunda femoris that was diminutive.  The SFA had about a 70% stenosis beyond the occlusion proximally and then normalized to the mid segment where there were 2 areas of moderate stenosis in the 60 to 70% range in the mid and mid to distal SFA.  The popliteal artery normalized and there appeared to be normal tibial flow distally although it was quite sluggish. I then crossed the aortic bifurcation and  advanced to the right femoral head and into the proximal SFA crossing the occlusion.  It was felt that it was in the patient's best interest to proceed with intervention after these images to avoid a second procedure and a larger amount of contrast and fluoroscopy based off of the findings from the initial angiogram. The patient was systemically heparinized and a 7 [place] sheath was then placed over the [employer] wire. I then exchanged for a V18 wire.  I then perform mechanical thrombectomy using the penumbra 7 bolt device throughout the right iliac arteries, common femoral artery, and proximal SFA.  Multiple passes were made and significant thrombus was removed.  There was then stenosis at the distal edge of the stent in the common femoral artery as well as in the proximal portion of the stents in the external iliac artery.  I performed angioplasty of nearly the entire right SFA to treat the 2 focal lesions in the mid and mid to distal segments as well as the proximal SFA just below the previously placed stents.  A 5 mm diameter by 30 cm length Lutonix drug-coated angioplasty balloon was inflated to 10 atm for 1 minute for the SFA.  The common femoral artery at the distal edge of the previously placed stent was then treated with a 6 mm diameter by 8 cm length Lutonix drug-coated angioplasty balloon.  I elected to primarily stent the external iliac artery due to small amount of residual thrombus and an 8 mm diameter by 7.5 cm length Viabahn stent was placed in the right external iliac artery and postdilated with an 8 mm balloon.  The distal common femoral artery still had a greater than 50% residual stenosis after angioplasty and so a 7 mm diameter by 7.5 cm length Viabahn stent was then deployed in the right common femoral artery and postdilated with a 7 mm balloon with excellent angiographic completion result and less than 10% residual stenosis in the external iliac artery and common femoral artery.   The SFA angioplasty looked good with less than 30% residual stenosis after angioplasty.  I elected to terminate the procedure. The sheath was removed and Celt closure device was deployed in the left femoral artery with excellent hemostatic result. The patient was taken to the recovery room in stable condition having tolerated the procedure well.  Findings:               Aortogram:  This demonstrated normal renal arteries and normal aorta.  The left iliac arteries did not appear to have any significant stenosis.  The right common iliac artery was patent as was the right internal iliac artery, but there was an abrupt occlusion of the proximal right external iliac artery consistent with thrombosis.  This continued down through the common femoral artery with reconstitution of the superficial femoral artery.  Right Lower Extremity:   This  occlusion continued down through the common femoral artery with reconstitution of the superficial femoral artery.  There was a small profunda femoris that was diminutive.  The SFA had about a 70% stenosis beyond the occlusion proximally and then normalized to the mid segment where there were 2 areas of moderate stenosis in the 60 to 70% range in the mid and mid to distal SFA.  The popliteal artery normalized and there appeared to be normal tibial flow distally although it was quite sluggish   Disposition: Patient was taken to the recovery room in stable condition having tolerated the procedure well.  Complications: None  Aaron Knox 10/01/2023 2:54 PM   This note was created with Dragon Medical transcription system. Any errors in dictation are purely unintentional.

## 2023-10-01 NOTE — Care Management Important Message (Signed)
 Important Message  Patient Details  Name: Aaron Knox MRN: [id number] Date of Birth: 26-Apr-1955   Important Message Given:  Yes - Medicare IM     [name] 10/01/2023, 12:42 PM

## 2023-10-01 NOTE — Progress Notes (Signed)
 PROGRESS NOTE    Aaron Knox  FMW:[id number] DOB: [birth year] DOA: 09/29/2023 PCP: Pcp, No    Brief Narrative:  68 year old gentleman with a past medical history of coronary artery disease status post PCI and DES to proximal left circumflex,PAD status post right femoral artery endarterectomy with iliac stent placement in June 2024, hypertension, hyperlipidemia who presents with increasing right lower extremity pain and tingling progressively worse since yesterday.   6/23: Hemodynamically stable.  S/p angiography of lower extremity with stent placement in right external iliac and right common femoral.  Patient apparently stopped taking his medications due to financial issues when his insurance stopped paying for it.  Assessment and Plan: * Peripheral arterial disease (HCC) Peripheral arterial disease, progressive symptoms, CTA with concern of occlusions. Has been off both antiplatelets and Eliquis  for more than 1 month due to financial reasons Vascular surgery consult: S/p lower extremity angiography and PCI to right external iliac and right common femoral arteries. - Continuing heparin  infusion for now-likely will need DAPT - Follow-up vascular surgery recommendations  Essential hypertension Blood pressure mildly elevated with heart rate mostly in high 40s to mid 50s. - Continue metoprolol  with holding parameters - Continue with Imdur   Aortic atherosclerosis (HCC) Aortic atherosclerosis Continue atorvastatin  80 mg   Esophageal lesion Seen on the patient's CT, needs further investigation needs close interval follow-up with gastroenterology as outpatient continue his PPI :Stable asymmetric soft tissue involving the anterior, distal esophagus since remote prior CT examination of 10/31/2022 and may represent a mass such as a leiomyoma, epiphrenic diverticulum, or complex cystic collection such as an esophageal duplication cyst.   Nicotine  abuse Encourage cessation  Iliac  aneurysm (HCC) Noted in his last vascular surgery note will defer to vascular surgery  CAD (coronary artery disease) Denies any chest pain although his had progressive dyspnea as well documented in his pulmonary note we will obtain an ECG and a troponin to ensure no acute cardiac events  Hypokalemia -Repleted   DVT prophylaxis: Heparin  infusion    Code Status: Full Code   Disposition Plan:  Level of care: Telemetry Surgical Status is: Inpatient    Consultants:  Vascular surgery  Subjective: Patient was seen and examined today.  Have pain with ambulation, no pain while resting.  Awaiting vascular surgery procedure.  Objective: Vitals:   10/01/23 1455 10/01/23 1515 10/01/23 1530 10/01/23 1557  BP: 136/63  (!) 140/76 (!) 147/76  Pulse: (!) 48 (!) 41 (!) 49 (!) 46  Resp: 12 12 20 18   Temp:    97.6 F (36.4 C)  TempSrc:      SpO2: 94% 93% 96% 99%  Weight:      Height:        Intake/Output Summary (Last 24 hours) at 10/01/2023 1711 Last data filed at 10/01/2023 1418 Gross per 24 hour  Intake 410.92 ml  Output 50 ml  Net 360.92 ml   Filed Weights   09/29/23 1724  Weight: 89.4 kg    Examination: General.  Well-developed elderly man, in no acute distress. Pulmonary.  Lungs clear bilaterally, normal respiratory effort. CV.  Regular rate and rhythm, no JVD, rub or murmur. Abdomen.  Soft, nontender, nondistended, BS positive. CNS.  Alert and oriented .  No focal neurologic deficit. Extremities.  No edema, mildly decreased pulses on right Psychiatry.  Judgment and insight appears normal.    Data Reviewed: I have personally reviewed following labs and imaging studies  CBC: Recent Labs  Lab 09/29/23 1759 09/30/23 0527 10/01/23 9641  WBC 6.1 5.4 5.7  NEUTROABS 4.3  --   --   HGB 12.7* 12.0* 11.9*  HCT 37.3* 35.4* 35.8*  MCV 92.1 93.4 92.7  PLT 168 154 165   Basic Metabolic Panel: Recent Labs  Lab 09/29/23 1759 09/30/23 0527 10/01/23 0358  NA 137 137  135  K 3.3* 3.6 3.7  CL 107 107 106  CO2 23 25 24   GLUCOSE 102* 95 94  BUN 13 12 15   CREATININE 0.92 0.83 0.82  CALCIUM  9.0 8.4* 8.6*  MG 2.2  --   --    GFR: Estimated Creatinine Clearance: 97.5 mL/min (by C-G formula based on SCr of 0.82 mg/dL). Liver Function Tests: Recent Labs  Lab 09/29/23 1759 09/30/23 0527  AST 29 20  ALT 24 21  ALKPHOS 74 72  BILITOT 0.8 0.7  PROT 6.4* 6.1*  ALBUMIN 3.9 3.5   No results for input(s): LIPASE, AMYLASE in the last 168 hours. No results for input(s): AMMONIA in the last 168 hours. Coagulation Profile: Recent Labs  Lab 09/29/23 2039  INR 1.1   Cardiac Enzymes: Recent Labs  Lab 09/29/23 1759  CKTOTAL 165   BNP (last 3 results) No results for input(s): PROBNP in the last 8760 hours. HbA1C: Recent Labs    09/29/23 1759  HGBA1C 5.3   CBG: No results for input(s): GLUCAP in the last 168 hours. Lipid Profile: No results for input(s): CHOL, HDL, LDLCALC, TRIG, CHOLHDL, LDLDIRECT in the last 72 hours. Thyroid Function Tests: No results for input(s): TSH, T4TOTAL, FREET4, T3FREE, THYROIDAB in the last 72 hours. Anemia Panel: No results for input(s): VITAMINB12, FOLATE, FERRITIN, TIBC, IRON, RETICCTPCT in the last 72 hours. Sepsis Labs: No results for input(s): PROCALCITON, LATICACIDVEN in the last 168 hours.  No results found for this or any previous visit (from the past 240 hours).    Radiology Studies: PERIPHERAL VASCULAR CATHETERIZATION Result Date: 10/01/2023 See surgical note for result.  CT Angio Aortobifemoral W and/or Wo Contrast Result Date: 09/29/2023 CLINICAL DATA:  Right leg pain, peripheral vascular disease EXAM: CT ANGIOGRAPHY OF ABDOMINAL AORTA WITH ILIOFEMORAL RUNOFF TECHNIQUE: Multidetector CT imaging of the abdomen, pelvis and lower extremities was performed using the standard protocol during bolus administration of intravenous contrast. Multiplanar CT image  reconstructions and MIPs were obtained to evaluate the vascular anatomy. RADIATION DOSE REDUCTION: This exam was performed according to the departmental dose-optimization program which includes automated exposure control, adjustment of the mA and/or kV according to patient size and/or use of iterative reconstruction technique. CONTRAST:  OMNIPAQUE  IOHEXOL  350 MG/ML SOLN COMPARISON:  01/12/2023 FINDINGS: VASCULAR Aorta: Extensive mixed atherosclerotic plaque. No hemodynamically significant stenosis. No aneurysm or dissection. Celiac: Patent without evidence of aneurysm, dissection, vasculitis or significant stenosis. SMA: Patent without evidence of aneurysm, dissection, vasculitis or significant stenosis. Renals: Dual renal arteries bilaterally. Widely patent without evidence of hemodynamically significant stenosis. Slightly irregular appearance of the right superior renal artery diffusely likely relates to atherosclerotic disease. Otherwise normal vascular morphology. No aneurysm or dissection. IMA: Patent without evidence of aneurysm, dissection, vasculitis or significant stenosis. RIGHT Lower Extremity Inflow: Right common and external iliac artery stenting again noted with chronic occlusion of the external iliac artery stent. Internal iliac artery is heavily disease but is patent at its origin. Outflow: Common femoral artery is stented and chronically occluded. Reconstitution of the superficial femoral artery via the corona mortis. Distally, the superficial femoral artery is diffusely moderately diseased with multifocal hemodynamically significant stenoses, most severe greater than 75% proximally.  Multifocal stenosis of the superficial femoral artery appears progressive since prior examination. Profundus femoral artery is proximally occluded but reconstituted via muscular collaterals. There is hypoperfusion of the right lower extremity with delayed and diminished overall arterial opacification. Runoff:  Popliteal artery is widely patent. Normal trifurcation with patency of the runoff vessels proximally. Patency distally beyond the level of the distal diaphysis of the tibia is not well assessed due to under filling. LEFT Lower Extremity Inflow: Diffusely diseased without evidence of hemodynamically significant stenosis involving the common and external iliac arteries. Internal iliac artery is moderately diseased but is patent proximally. Outflow: Common femoral, superficial femoral, and profundus femoral arteries are patent without evidence of hemodynamically significant stenosis. Variant anatomy with the third perforating muscular arterial branch of left lower extremity arising from the superficial femoral artery. Runoff: Popliteal artery is widely patent. Normal trifurcation. Occlusion of the anterior tibial artery at the level of the proximal diaphysis. Two vessel runoff to the left ankle with continuation of the posterior tibial artery to form the plantar arch and reconstitution of the dorsalis pedis artery via the peroneal artery. Veins: Not well assessed due to arterial phase enhancement. Morphologically unremarkable. Review of the MIP images confirms the above findings. NON-VASCULAR Lower chest: Asymmetric soft tissue involving the anterior, distal esophagus (39/5) appears stable since remote prior CT examination of 10/31/2022 and may represent a mass such as a leiomyoma, epiphrenic diverticulum, or complex cystic collection such as an esophageal duplication cyst. No acute abnormality. Hepatobiliary: No focal liver abnormality is seen. No gallstones, gallbladder wall thickening, or biliary dilatation. Pancreas: Unremarkable Spleen: Unremarkable Adrenals/Urinary Tract: Adrenal glands are unremarkable. Simple cortical cysts are seen within the right kidney for which no follow-up imaging is recommended. The kidneys are otherwise unremarkable. Stable congenital right posterolateral bladder diverticulum. Bladder  otherwise. Stomach/Bowel: Stomach is within normal limits. Appendix appears normal. No evidence of bowel wall thickening, distention, or inflammatory changes. Lymphatic: No pathologic adenopathy within the abdomen and pelvis Reproductive: Prostate is unremarkable. Other: Postsurgical changes noted within the right inguinal region. Tiny fat containing umbilical hernia Musculoskeletal: No acute bone abnormality. No lytic or blastic bone lesion. Osseous structures are age appropriate. IMPRESSION: 1. 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 Reconstitution of the right superficial femoral artery via the corona mortis with multifocal hemodynamically significant stenoses, most severe greater than 75% proximally. There is hypoperfusion of the right lower extremity with delayed and diminished arterial opacification. These findings appear similar since prior examination 01/12/2023, though the degree of relative hypoperfusion of the right lower extremity has progressed due to progressive disease within the reconstituted right superficial artery. 2. Three-vessel runoff at least to the level of the distal diaphysis of the right lower extremity. Patency distally is not well assessed due to filling. 3. Two vessel runoff to the left ankle with occlusion of the left anterior tibial artery at the level of the proximal diaphysis. 4. Stable asymmetric soft tissue involving the anterior, distal esophagus since remote prior CT examination of 10/31/2022 and may represent a mass such as a leiomyoma, epiphrenic diverticulum, or complex cystic collection such as an esophageal duplication cyst. 5. Aortic atherosclerosis. Aortic Atherosclerosis (ICD10-I70.0). Electronically Signed   By: Dorethia Molt M.D.   On: 09/29/2023 19:59    Scheduled Meds:  atorvastatin   80 mg Oral q1800   isosorbide  mononitrate  30 mg Oral Daily   metoprolol  succinate  50 mg Oral Daily   nicotine   7 mg  Transdermal Daily   pantoprazole   20 mg Oral Daily   Continuous Infusions:  heparin  1,400 Units/hr (10/01/23 1549)     LOS: 2 days    Time spent: 50 minutes spent on chart review, discussion with nursing staff, consultants, updating family and interview/physical exam; more than 50% of that time was spent in counseling and/or coordination of care.  Amaryllis Dare, MD [facility] Available via Epic secure chat 7am-7pm After these hours, please refer to coverage provider listed on [email or web address] 10/01/2023, 5:11 PM

## 2023-10-01 NOTE — TOC CM/SW Note (Signed)
 Transition of Care [facility]) - Inpatient Brief Assessment   Patient Details  Name: Aaron Knox MRN: [id number] Date of Birth: 06-25-1955  Transition of Care [facility]) CM/SW Contact:    Lauraine JAYSON Carpen, LCSW Phone Number: 10/01/2023, 4:36 PM   Clinical Narrative: CSW reviewed chart. No PCP. Added list to AVS. No other TOC needs identified at this time. CSW will continue to follow progress. Please place [facility] consult if any needs arise.  Transition of Care Asessment: Insurance and Status: Insurance coverage has been reviewed Patient has primary care physician: No Home environment has been reviewed: Single familiy home Prior level of function:: Not documented Prior/Current Home Services: No current home services Social Drivers of Health Review: SDOH reviewed no interventions necessary Readmission risk has been reviewed: Yes Transition of care needs: no transition of care needs at this time

## 2023-10-02 ENCOUNTER — Telehealth (HOSPITAL_COMMUNITY): Payer: Self-pay | Admitting: Pharmacy Technician

## 2023-10-02 ENCOUNTER — Other Ambulatory Visit (HOSPITAL_COMMUNITY): Payer: Self-pay

## 2023-10-02 ENCOUNTER — Other Ambulatory Visit: Payer: Self-pay

## 2023-10-02 ENCOUNTER — Encounter: Payer: Self-pay | Admitting: Vascular Surgery

## 2023-10-02 DIAGNOSIS — M79604 Pain in right leg: Secondary | ICD-10-CM | POA: Diagnosis not present

## 2023-10-02 DIAGNOSIS — I10 Essential (primary) hypertension: Secondary | ICD-10-CM | POA: Diagnosis not present

## 2023-10-02 DIAGNOSIS — I739 Peripheral vascular disease, unspecified: Secondary | ICD-10-CM | POA: Diagnosis not present

## 2023-10-02 DIAGNOSIS — I700 Atherosclerosis of aorta: Secondary | ICD-10-CM | POA: Diagnosis not present

## 2023-10-02 LAB — BASIC METABOLIC PANEL WITH GFR
Anion gap: 5 (ref 5–15)
BUN: 16 mg/dL (ref 8–23)
CO2: 28 mmol/L (ref 22–32)
Calcium: 8.5 mg/dL — ABNORMAL LOW (ref 8.9–10.3)
Chloride: 107 mmol/L (ref 98–111)
Creatinine, Ser: 0.93 mg/dL (ref 0.61–1.24)
GFR, Estimated: >60 mL/min (ref >60)
Glucose, Bld: 103 mg/dL — ABNORMAL HIGH (ref 70–99)
Potassium: 3.7 mmol/L (ref 3.5–5.1)
Sodium: 140 mmol/L (ref 135–145)

## 2023-10-02 LAB — CBC
HCT: 34.1 % — ABNORMAL LOW (ref 39.0–52.0)
Hemoglobin: 11.4 g/dL — ABNORMAL LOW (ref 13.0–17.0)
MCH: 30.9 pg (ref 26.0–34.0)
MCHC: 33.4 g/dL (ref 30.0–36.0)
MCV: 92.4 fL (ref 80.0–100.0)
Platelets: 147 10*3/uL — ABNORMAL LOW (ref 150–400)
RBC: 3.69 MIL/uL — ABNORMAL LOW (ref 4.22–5.81)
RDW: 12.9 % (ref 11.5–15.5)
WBC: 5.2 10*3/uL (ref 4.0–10.5)
nRBC: 0 % (ref 0.0–0.2)

## 2023-10-02 LAB — HEPARIN LEVEL (UNFRACTIONATED): Heparin Unfractionated: 0.54 [IU]/mL (ref 0.30–0.70)

## 2023-10-02 MED ORDER — APIXABAN (ELIQUIS) VTE STARTER PACK (10MG AND 5MG): ORAL_TABLET | ORAL | 0 refills | Status: DC

## 2023-10-02 MED ORDER — APIXABAN 5 MG PO TABS: 5.0000 mg | ORAL_TABLET | Freq: Two times a day (BID) | ORAL | 2 refills | Status: DC

## 2023-10-02 MED ORDER — APIXABAN 5 MG PO TABS
10.0000 mg | ORAL_TABLET | Freq: Two times a day (BID) | ORAL | Status: DC
  Administered 2023-10-02: 10 mg via ORAL
  Filled 2023-10-02: qty 2

## 2023-10-02 MED ORDER — CLOPIDOGREL BISULFATE 75 MG PO TABS: 75.0000 mg | ORAL_TABLET | Freq: Every day | ORAL | 11 refills | Status: AC

## 2023-10-02 MED ORDER — APIXABAN 5 MG PO TABS: 5.0000 mg | ORAL_TABLET | Freq: Two times a day (BID) | ORAL | Status: DC

## 2023-10-02 MED ORDER — ISOSORBIDE MONONITRATE ER 30 MG PO TB24: 30.0000 mg | ORAL_TABLET | Freq: Every day | ORAL | 2 refills | Status: DC

## 2023-10-02 NOTE — Discharge Summary (Signed)
 Physician Discharge Summary   Patient: Aaron Knox MRN: [id number] DOB: 01-07-1956  Admit date:     09/29/2023  Discharge date: 10/02/23  Discharge Physician: Amaryllis Dare   PCP: Pcp, No   Recommendations at discharge:  Please obtain CBC and BMP on follow-up Patient need outpatient follow-up with gastroenterology for further evaluation of esophageal lesion found on CT Home metoprolol  was discontinued due to borderline bradycardia.  His PCP and cardiology can reassess and restart if appropriate. Follow-up with primary care provider Follow-up with vascular surgery  Discharge Diagnoses: Principal Problem:   Peripheral arterial disease (HCC) Active Problems:   Essential hypertension   Hypokalemia   CAD (coronary artery disease)   Iliac aneurysm (HCC)   Nicotine  abuse   Encounter for screening involving social determinants of health (SDoH)   Esophageal lesion   Aortic atherosclerosis (HCC)   Right leg pain   Hospital Course:  68 year old gentleman with a past medical history of coronary artery disease status post PCI and DES to proximal left circumflex,PAD status post right femoral artery endarterectomy with iliac stent placement in June 2024, hypertension, hyperlipidemia who presents with increasing right lower extremity pain and tingling progressively worse since yesterday.   CTA concerning for arterial occlusion so vascular surgery was consulted and patient underwent lower extremity angiography and PCI to right external iliac and right common femoral arteries.  Patient did had rethrombosis of prior stents due to medication noncompliance.  He stopped taking his antiplatelets and Eliquis  due to financial reasons.  Vascular surgery would like him to continue Plavix  and Eliquis .  Our pharmacist discussed with him and reviewed his insurance need, he just had a deductible of $350 left and after that his out-of-pocket cost will be $47 which he agrees.  If he is having difficulty  paying for those medications, his provider can help him enroll in the patient assistance program and hopefully he will qualify.  Patient need to continue Eliquis  and Plavix  in order to prevent rethrombosis of his stents and to avoid losing his limb.  Patient was also found to have an esophageal lesion on CT.  Seems stable as he has similar lesion present on a prior CT exam done on 10/31/2022.  It could represent a mass or a complex cystic collection.  Patient was given a referral to see a gastroenterologist as outpatient for further evaluation.  Patient was having borderline bradycardia so home metoprolol  was discontinued.  He need to have a close follow-up with his PCP and cardiologist and they can restart nodal blocking agents if appropriate.  Patient will continue on current medications and need to have a close follow-up with his providers for further assistance.   Consultants: Vascular surgery Procedures performed: Lower extremity angiography and PCI Disposition: Home Diet recommendation:  Discharge Diet Orders (From admission, onward)     Start     Ordered   10/02/23 0000  Diet - low sodium heart healthy        10/02/23 1049           Cardiac diet DISCHARGE MEDICATION: Allergies as of 10/02/2023       Reactions   Aspirin  Anaphylaxis   Penicillins Other (See Comments)   Childhood allergy per pt's mother. He does not recall any details of the reaction. Willing to try beta lactams.        Medication List     STOP taking these medications    famotidine  20 MG tablet Commonly known as: PEPCID    metoprolol  succinate 50 MG  24 hr tablet Commonly known as: Toprol  XL       TAKE these medications    Apixaban  Starter Pack (10mg  and 5mg ) Commonly known as: ELIQUIS  STARTER PACK Take as directed on package: start with two-5mg  tablets twice daily for 7 days. On day 8, switch to one-5mg  tablet twice daily.   apixaban  5 MG Tabs tablet Commonly known as: ELIQUIS  Take 1 tablet  (5 mg total) by mouth 2 (two) times daily.   atorvastatin  80 MG tablet Commonly known as: LIPITOR  Take 1 tablet (80 mg total) by mouth daily at 6 PM.   clopidogrel  75 MG tablet Commonly known as: Plavix  Take 1 tablet (75 mg total) by mouth daily.   isosorbide  mononitrate 30 MG 24 hr tablet Commonly known as: IMDUR  Take 1 tablet (30 mg total) by mouth daily.   nicotine  polacrilex 2 MG lozenge Commonly known as: Nicotine  Mini Take 1 lozenge (2 mg total) by mouth every 2 (two) hours as needed.   pantoprazole  20 MG tablet Commonly known as: PROTONIX  Take 1 tablet (20 mg total) by mouth daily.        Follow-up Information     Dew, Selinda RAMAN, MD. Schedule an appointment as soon as possible for a visit in 4 week(s).   Specialties: Vascular Surgery, Radiology, Interventional Cardiology Why: Bilateral Lower extremity Arterial duplex U/S with ABI's Contact information: [address] [place] [address] [phone]                Discharge Exam: Aaron Knox   09/29/23 1724  Weight: 89.4 kg   General.  Well-developed elderly man, in no acute distress. Pulmonary.  Lungs clear bilaterally, normal respiratory effort. CV.  Regular rate and rhythm, no JVD, rub or murmur. Abdomen.  Soft, nontender, nondistended, BS positive. CNS.  Alert and oriented .  No focal neurologic deficit. Extremities.  No edema, pulses intact and symmetrical. Psychiatry.  Judgment and insight appears normal.   Condition at discharge: stable  The results of significant diagnostics from this hospitalization (including imaging, microbiology, ancillary and laboratory) are listed below for reference.   Imaging Studies: PERIPHERAL VASCULAR CATHETERIZATION Result Date: 10/01/2023 See surgical note for result.  CT Angio Aortobifemoral W and/or Wo Contrast Result Date: 09/29/2023 CLINICAL DATA:  Right leg pain, peripheral vascular disease EXAM: CT ANGIOGRAPHY OF ABDOMINAL AORTA WITH  ILIOFEMORAL RUNOFF TECHNIQUE: Multidetector CT imaging of the abdomen, pelvis and lower extremities was performed using the standard protocol during bolus administration of intravenous contrast. Multiplanar CT image reconstructions and MIPs were obtained to evaluate the vascular anatomy. RADIATION DOSE REDUCTION: This exam was performed according to the departmental dose-optimization program which includes automated exposure control, adjustment of the mA and/or kV according to patient size and/or use of iterative reconstruction technique. CONTRAST:  OMNIPAQUE  IOHEXOL  350 MG/ML SOLN COMPARISON:  01/12/2023 FINDINGS: VASCULAR Aorta: Extensive mixed atherosclerotic plaque. No hemodynamically significant stenosis. No aneurysm or dissection. Celiac: Patent without evidence of aneurysm, dissection, vasculitis or significant stenosis. SMA: Patent without evidence of aneurysm, dissection, vasculitis or significant stenosis. Renals: Dual renal arteries bilaterally. Widely patent without evidence of hemodynamically significant stenosis. Slightly irregular appearance of the right superior renal artery diffusely likely relates to atherosclerotic disease. Otherwise normal vascular morphology. No aneurysm or dissection. IMA: Patent without evidence of aneurysm, dissection, vasculitis or significant stenosis. RIGHT Lower Extremity Inflow: Right common and external iliac artery stenting again noted with chronic occlusion of the external iliac artery stent. Internal iliac artery is heavily disease  but is patent at its origin. Outflow: Common femoral artery is stented and chronically occluded. Reconstitution of the superficial femoral artery via the corona mortis. Distally, the superficial femoral artery is diffusely moderately diseased with multifocal hemodynamically significant stenoses, most severe greater than 75% proximally. Multifocal stenosis of the superficial femoral artery appears progressive since prior examination.  Profundus femoral artery is proximally occluded but reconstituted via muscular collaterals. There is hypoperfusion of the right lower extremity with delayed and diminished overall arterial opacification. Runoff: Popliteal artery is widely patent. Normal trifurcation with patency of the runoff vessels proximally. Patency distally beyond the level of the distal diaphysis of the tibia is not well assessed due to under filling. LEFT Lower Extremity Inflow: Diffusely diseased without evidence of hemodynamically significant stenosis involving the common and external iliac arteries. Internal iliac artery is moderately diseased but is patent proximally. Outflow: Common femoral, superficial femoral, and profundus femoral arteries are patent without evidence of hemodynamically significant stenosis. Variant anatomy with the third perforating muscular arterial branch of left lower extremity arising from the superficial femoral artery. Runoff: Popliteal artery is widely patent. Normal trifurcation. Occlusion of the anterior tibial artery at the level of the proximal diaphysis. Two vessel runoff to the left ankle with continuation of the posterior tibial artery to form the plantar arch and reconstitution of the dorsalis pedis artery via the peroneal artery. Veins: Not well assessed due to arterial phase enhancement. Morphologically unremarkable. Review of the MIP images confirms the above findings. NON-VASCULAR Lower chest: Asymmetric soft tissue involving the anterior, distal esophagus (39/5) appears stable since remote prior CT examination of 10/31/2022 and may represent a mass such as a leiomyoma, epiphrenic diverticulum, or complex cystic collection such as an esophageal duplication cyst. No acute abnormality. Hepatobiliary: No focal liver abnormality is seen. No gallstones, gallbladder wall thickening, or biliary dilatation. Pancreas: Unremarkable Spleen: Unremarkable Adrenals/Urinary Tract: Adrenal glands are unremarkable.  Simple cortical cysts are seen within the right kidney for which no follow-up imaging is recommended. The kidneys are otherwise unremarkable. Stable congenital right posterolateral bladder diverticulum. Bladder otherwise. Stomach/Bowel: Stomach is within normal limits. Appendix appears normal. No evidence of bowel wall thickening, distention, or inflammatory changes. Lymphatic: No pathologic adenopathy within the abdomen and pelvis Reproductive: Prostate is unremarkable. Other: Postsurgical changes noted within the right inguinal region. Tiny fat containing umbilical hernia Musculoskeletal: No acute bone abnormality. No lytic or blastic bone lesion. Osseous structures are age appropriate. IMPRESSION: 1. 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 Reconstitution of the right superficial femoral artery via the corona mortis with multifocal hemodynamically significant stenoses, most severe greater than 75% proximally. There is hypoperfusion of the right lower extremity with delayed and diminished arterial opacification. These findings appear similar since prior examination 01/12/2023, though the degree of relative hypoperfusion of the right lower extremity has progressed due to progressive disease within the reconstituted right superficial artery. 2. Three-vessel runoff at least to the level of the distal diaphysis of the right lower extremity. Patency distally is not well assessed due to filling. 3. Two vessel runoff to the left ankle with occlusion of the left anterior tibial artery at the level of the proximal diaphysis. 4. Stable asymmetric soft tissue involving the anterior, distal esophagus since remote prior CT examination of 10/31/2022 and may represent a mass such as a leiomyoma, epiphrenic diverticulum, or complex cystic collection such as an esophageal duplication cyst. 5. Aortic atherosclerosis. Aortic Atherosclerosis (ICD10-I70.0). Electronically  Signed   By:  Dorethia Molt M.D.   On: 09/29/2023 19:59   LONG TERM MONITOR (3-14 DAYS) Result Date: 09/23/2023 Event monitor Patch Wear Time:  13 days and 23 hours (2025-05-01T08:33:12-398 to 2025-05-15T08:33:04-398) Normal sinus rhythm Patient had a min HR of 41 bpm, max HR of 214 bpm, and avg HR of 70 bpm. 1 run of Ventricular Tachycardia occurred lasting 5 beats with a max rate of 214 bpm (avg 196 bpm). 172 Supraventricular Tachycardia runs occurred, the run with the fastest interval lasting 4 beats with a max rate of 200 bpm, the longest lasting 34.2 secs with an avg rate of 120 bpm. Isolated SVEs were frequent (13.4%, 817001), SVE Couplets were occasional (1.9%, 13006), and SVE Triplets were rare (<1.0%, 1162). Isolated VEs were rare (<1.0%, 782), VE Couplets were rare (<1.0%, 6), and VE Triplets were rare (<1.0%, 2). Patient triggered events (20) associated with normal sinus rhythm, and PACs Signed, Velinda Lunger, MD, Ph.D [facility]   ECHOCARDIOGRAM COMPLETE Result Date: 09/13/2023    ECHOCARDIOGRAM REPORT   Patient Name:   GANNON HEINZMAN Date of Exam: 09/13/2023 Medical Rec #:  [id number]          Height:       74.0 in Accession #:    [id number]         Weight:       195.8 lb Date of Birth:  Mar 22, 1956          BSA:          2.154 m Patient Age:    68 years           BP:           156/82 mmHg Patient Gender: M                  HR:           64 bpm. Exam Location:  [place] Procedure: 2D Echo, Cardiac Doppler, Color Doppler and Strain Analysis (Both            Spectral and Color Flow Doppler were utilized during procedure). Indications:    R06.02 SOB  History:        Patient has prior history of Echocardiogram examinations, most                 recent 01/28/2018. CAD and Previous Myocardial Infarction, PAD,                 Signs/Symptoms:Shortness of Breath; Risk Factors:Hypertension                 and Former Smoker.  Sonographer:    Doyal Point MHA, BS, RDCS Referring Phys: [phone] CADENCE H  FURTH IMPRESSIONS  1. Left ventricular ejection fraction, by estimation, is 55%. The left ventricle has normal function. The left ventricle has no regional wall motion abnormalities. Left ventricular diastolic parameters were normal.  2. Right ventricular systolic function is normal. The right ventricular size is normal.  3. The mitral valve is normal in structure. No evidence of mitral valve regurgitation.  4. The aortic valve is tricuspid. Aortic valve regurgitation is not visualized. Aortic valve sclerosis/calcification is present, without any evidence of aortic stenosis.  5. The inferior vena cava is normal in size with greater than 50% respiratory variability, suggesting right atrial pressure of 3 mmHg. FINDINGS  Left Ventricle: Left ventricular ejection fraction, by estimation, is 55%. The left ventricle has normal function. The left ventricle has no regional wall motion abnormalities. Global longitudinal strain  performed but not reported based on interpreter judgement due to suboptimal tracking. The left ventricular internal cavity size was normal in size. There is no left ventricular hypertrophy. Left ventricular diastolic parameters were normal. Right Ventricle: The right ventricular size is normal. No increase in right ventricular wall thickness. Right ventricular systolic function is normal. Left Atrium: Left atrial size was normal in size. Right Atrium: Right atrial size was normal in size. Pericardium: There is no evidence of pericardial effusion. Mitral Valve: The mitral valve is normal in structure. No evidence of mitral valve regurgitation. Tricuspid Valve: The tricuspid valve is normal in structure. Tricuspid valve regurgitation is not demonstrated. Aortic Valve: The aortic valve is tricuspid. Aortic valve regurgitation is not visualized. Aortic valve sclerosis/calcification is present, without any evidence of aortic stenosis. Aortic valve mean gradient measures 2.0 mmHg. Aortic valve peak gradient  measures 4.2 mmHg. Aortic valve area, by VTI measures 3.72 cm. Pulmonic Valve: The pulmonic valve was not well visualized. Pulmonic valve regurgitation is not visualized. Aorta: The aortic root is normal in size and structure. Venous: The inferior vena cava is normal in size with greater than 50% respiratory variability, suggesting right atrial pressure of 3 mmHg. IAS/Shunts: No atrial level shunt detected by color flow Doppler.  LEFT VENTRICLE PLAX 2D LVIDd:         5.10 cm   Diastology LVIDs:         4.33 cm   LV e' medial:    7.18 cm/s LV PW:         0.81 cm   LV E/e' medial:  10.4 LV IVS:        1.06 cm   LV e' lateral:   8.38 cm/s LVOT diam:     2.20 cm   LV E/e' lateral: 8.9 LV SV:         89 LV SV Index:   41 LVOT Area:     3.80 cm  RIGHT VENTRICLE RV S prime:     13.80 cm/s TAPSE (M-mode): 2.5 cm LEFT ATRIUM             Index LA diam:        4.70 cm 2.18 cm/m LA Vol (A2C):   67.3 ml 31.24 ml/m LA Vol (A4C):   61.1 ml 28.37 ml/m LA Biplane Vol: 64.3 ml 29.85 ml/m  AORTIC VALVE AV Area (Vmax):    3.51 cm AV Area (Vmean):   3.40 cm AV Area (VTI):     3.72 cm AV Vmax:           102.00 cm/s AV Vmean:          66.900 cm/s AV VTI:            0.239 m AV Peak Grad:      4.2 mmHg AV Mean Grad:      2.0 mmHg LVOT Vmax:         94.30 cm/s LVOT Vmean:        59.900 cm/s LVOT VTI:          0.234 m LVOT/AV VTI ratio: 0.98  AORTA Ao Sinus diam: 3.33 cm MITRAL VALVE MV Area (PHT): 3.21 cm    SHUNTS MV Decel Time: 236 msec    Systemic VTI:  0.23 m MV E velocity: 74.80 cm/s  Systemic Diam: 2.20 cm MV A velocity: 66.50 cm/s MV E/A ratio:  1.12 Redell Cave MD Electronically signed by Redell Cave MD Signature Date/Time: 09/13/2023/3:10:23 PM    Final  Microbiology: Results for orders placed or performed during the hospital encounter of 11/01/22  Blood culture (routine x 2)     Status: None   Collection Time: 11/01/22  4:40 AM   Specimen: BLOOD  Result Value Ref Range Status   Specimen Description  BLOOD LEFT ARM  Final   Special Requests   Final    BOTTLES DRAWN AEROBIC AND ANAEROBIC Blood Culture adequate volume   Culture   Final    NO GROWTH 5 DAYS Performed at [facility], [address] [place], [place] [address]    Report Status 11/06/2022 FINAL  Final  Blood culture (routine x 2)     Status: None   Collection Time: 11/01/22  4:40 AM   Specimen: BLOOD  Result Value Ref Range Status   Specimen Description BLOOD LEFT AC  Final   Special Requests   Final    BOTTLES DRAWN AEROBIC AND ANAEROBIC Blood Culture results may not be optimal due to an inadequate volume of blood received in culture bottles   Culture   Final    NO GROWTH 5 DAYS Performed at [facility], [address] [place], [place] [address]    Report Status 11/06/2022 FINAL  Final    Labs: CBC: Recent Labs  Lab 09/29/23 1759 09/30/23 0527 10/01/23 0358 10/02/23 0352  WBC 6.1 5.4 5.7 5.2  NEUTROABS 4.3  --   --   --   HGB 12.7* 12.0* 11.9* 11.4*  HCT 37.3* 35.4* 35.8* 34.1*  MCV 92.1 93.4 92.7 92.4  PLT 168 154 165 147*   Basic Metabolic Panel: Recent Labs  Lab 09/29/23 1759 09/30/23 0527 10/01/23 0358 10/02/23 0352  NA 137 137 135 140  K 3.3* 3.6 3.7 3.7  CL 107 107 106 107  CO2 23 25 24 28   GLUCOSE 102* 95 94 103*  BUN 13 12 15 16   CREATININE 0.92 0.83 0.82 0.93  CALCIUM  9.0 8.4* 8.6* 8.5*  MG 2.2  --   --   --    Liver Function Tests: Recent Labs  Lab 09/29/23 1759 09/30/23 0527  AST 29 20  ALT 24 21  ALKPHOS 74 72  BILITOT 0.8 0.7  PROT 6.4* 6.1*  ALBUMIN 3.9 3.5   CBG: No results for input(s): GLUCAP in the last 168 hours.  Discharge time spent: greater than 30 minutes.  This record has been created using Conservation officer, historic buildings. Errors have been sought and corrected,but may not always be located. Such creation errors do not reflect on the standard of care.   Signed: Amaryllis Dare, MD [facility] 10/02/2023

## 2023-10-02 NOTE — Consult Note (Signed)
 Pharmacy Consult Note - Anticoagulation  Pharmacy Consult for heparin  Indication: PAD  PATIENT MEASUREMENTS: Height: 6' 2 (188 cm) Weight: 89.4 kg (197 lb) IBW/kg (Calculated) : 82.2 HEPARIN  DW (KG): 89.4  VITAL SIGNS: Temp: 97.8 F (36.6 C) (06/24 0405) Temp Source: Oral (06/24 0405) BP: 122/67 (06/24 0405) Pulse Rate: 57 (06/24 0405)  Recent Labs    09/29/23 1759 09/29/23 2039 09/29/23 2256 09/30/23 0527 10/02/23 0352  HGB 12.7*  --   --    < > 11.4*  HCT 37.3*  --   --    < > 34.1*  PLT 168  --   --    < > 147*  APTT  --  31  --   --   --   LABPROT  --  14.5  --   --   --   INR  --  1.1  --   --   --   HEPARINUNFRC  --  <0.10*  --    < > 0.54  CREATININE 0.92  --   --    < > 0.93  CKTOTAL 165  --   --   --   --   TROPONINIHS 7  --  9  --   --    < > = values in this interval not displayed.    Estimated Creatinine Clearance: 85.9 mL/min (by C-G formula based on SCr of 0.93 mg/dL).  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)    CAD in native artery    a.) LHC/PCI 08/09/2014 (setting of STEMI): 30% mLAD, 99% pLCx (3.0 x 15 mm Resolute Integrity DES x 1 with 10% residual post-intervention stenosis), 45% mLCx, 20% pRCA, 40% dRCA); b.) LHC 01/29/2018: 30% mLAD, 45% mLCx, 50% pRCA, 80% dRCA --> med mgmt   CHF (congestive heart failure) (HCC)    DDD (degenerative disc disease), lumbar    Diastolic dysfunction    a.) TTE 08/11/2014 (s/p STEMI): EF 45-50%, inf HK, AoV sclerosis, G1DD; b.) TTE 02/28/2018: EF 55-60%, G1DD   History of bilateral cataract extraction 2023   HTN (hypertension)    Hyperlipidemia    Long term current use of antithrombotics/antiplatelets    a.) clopidogrel    ST elevation myocardial infarction (STEMI) of true posterior wall (HCC) 08/09/2014   a.) LHC/PCI 08/09/2014: 3.0 x 15 mm Resolute Integrity DES x 1 pLCx with 10% residual post-intervention stenosis)    Unstable angina [facility])     ASSESSMENT: 68 y.o. male with PMH including PAD, CAD, vascular surgery is presenting with RLE pain. Patient was started on apixaban  and clopidogrel  by vascular surgery but has been off apixaban  for several months per chart review due to affordability/coverage. Pharmacy has been consulted to initiate and manage heparin  intravenous infusion. Additional baseline labs have been ordered.  Pertinent medications: Clopidogrel  75mg  daily  Goal(s) of therapy: Heparin  level 0.3 - 0.7 units/mL Monitor platelets by anticoagulation protocol: Yes   Baseline anticoagulation labs: Recent Labs    09/29/23 2039 09/30/23 0527 10/01/23 0358 10/02/23 0352  APTT 31  --   --   --   INR 1.1  --   --   --   HGB  --  12.0* 11.9* 11.4*  PLT  --  154 165 147*    Date Time HL Rate/Comment 6/22 0527 0.77 SUPRAtherapeutic@1500  un/hr 6/22 1320 0.66 Therapeutic x 1@1400  un/hr 6/22 1928 0.61 Therapeutic x 2 6/23  0358   0.69     Therapeutic X 3 6/24 0352 0.54 Therapeutic x 4    PLAN: Continue heparin  infusion at 1400 units/hour. Check heparin  level with AM labs CBC daily  Rankin CANDIE Dills, PharmD, [facility] 10/02/2023 5:09 AM

## 2023-10-02 NOTE — Telephone Encounter (Signed)
 Patient Product/process development scientist completed.    The patient is insured through [employer]. Patient has Medicare and is not eligible for a copay card, but may be able to apply for patient assistance or Medicare RX Payment Plan (Patient Must reach out to their plan, if eligible for payment plan), if available.    Ran test claim for Eliquis  Starter Pack and the current 30 day co-pay is $397.00 due to a $350.00 deductible.  Will be $47.00 once deductible is met.   This test claim was processed through [facility]- copay amounts may vary at other pharmacies due to pharmacy/plan contracts, or as the patient moves through the different stages of their insurance plan.     Reyes Sharps, CPHT Pharmacy Technician III Certified Patient Advocate [facility] Patient Advocate Team Direct Number: [phone]  Fax: [phone]

## 2023-10-02 NOTE — Progress Notes (Signed)
 OT Cancellation Note  Patient Details Name: Aaron Knox MRN: [id number] DOB: 1956/02/26   Cancelled Treatment:    Reason Eval/Treat Not Completed: OT screened, no needs identified, will sign off. Pt is ambulating the hallways pushing his IV pole independently. Per nursing staff this is his 3rd time ambulating around the unit independently. OT to sign off in house with no skilled needs.  [name] 10/02/2023, 9:18 AM

## 2023-10-03 ENCOUNTER — Telehealth (INDEPENDENT_AMBULATORY_CARE_PROVIDER_SITE_OTHER): Payer: Self-pay

## 2023-10-03 ENCOUNTER — Telehealth: Payer: Self-pay | Admitting: Cardiovascular Disease

## 2023-10-03 NOTE — Telephone Encounter (Signed)
 Called and spoke with patient. Patient had surgery for DVT on 10/01/23 and was discharged from hospital yesterday. Patient with complaint of feeling dizzy, light headed and shortness of breath that started yesterday after discharge. Patient reports that his symptoms are worse today and has felt like he was going to pass out multiple times today. Patient reports that his blood pressure was 160/110 yesterday after discharge. Patient states current blood pressure of 102/55 and heart rate in 60's. Recommended patient to be evaluated in the Emergency Department. Patient verbalizes understanding.

## 2023-10-03 NOTE — Telephone Encounter (Signed)
 Pt c/o BP issue: STAT if pt c/o blurred vision, one-sided weakness or slurred speech.  STAT if BP is GREATER than 180/120 TODAY.  STAT if BP is LESS than 90/60 and SYMPTOMATIC TODAY  1. What is your BP concern? BP keeps going up and down  2. Have you taken any BP medication today?yes  3. What are your last 5 BP readings?129/80 74, 115/57 110/66, 106/55 67  4. Are you having any other symptoms (ex. Dizziness, headache, blurred vision, passed out)? Dizziness, weak, clammy feeling

## 2023-10-03 NOTE — Telephone Encounter (Signed)
 Patient significant other reach out to the office stating that the patient has been feeling weak, light-headed, and shortness of breathe since yesterday. Patient blood pressure has be fluctuating up and down.The symptoms are worst today. Patient has reach out to his cardiologist and was recommended to be evaluated at the ED. Due to the medication changes patient is not sure if this could be the cause feeling weak. Patient had right le angio on 10/01/23. I spoke with Orvin NP and she recommended for the patient to be evaluated at the ED. Patient verbalized understanding to medical advice.

## 2023-10-05 NOTE — Telephone Encounter (Signed)
 Called patient and notified him of the following from Cadence Bolivar, [place].  Agree with ED eval if symptoms persist. Can hold BP meds in the interim to see if symptoms improve   Patient verbalizes understanding. Patient reports that he still has some dizziness but it has improved. Patient reports blood pressure this morning of 144/86 prior to medication and 152/95 about an hour after taking Imdur  30 MG and Metoprolol  50 MG. Patient has an appointment scheduled for 10/08/23. Patient encouraged to bring a blood pressure log to upcoming appointment. Patient made aware of ED precaution should any new symptoms develop or worsen.

## 2023-10-08 ENCOUNTER — Ambulatory Visit: Attending: Medical | Admitting: Medical

## 2023-10-08 ENCOUNTER — Encounter: Payer: Self-pay | Admitting: Medical

## 2023-10-08 VITALS — BP 144/72 | HR 66 | Ht 74.0 in | Wt 196.8 lb

## 2023-10-08 DIAGNOSIS — F17200 Nicotine dependence, unspecified, uncomplicated: Secondary | ICD-10-CM

## 2023-10-08 DIAGNOSIS — Z79899 Other long term (current) drug therapy: Secondary | ICD-10-CM

## 2023-10-08 DIAGNOSIS — I739 Peripheral vascular disease, unspecified: Secondary | ICD-10-CM | POA: Diagnosis not present

## 2023-10-08 DIAGNOSIS — E782 Mixed hyperlipidemia: Secondary | ICD-10-CM

## 2023-10-08 DIAGNOSIS — I25118 Atherosclerotic heart disease of native coronary artery with other forms of angina pectoris: Secondary | ICD-10-CM

## 2023-10-08 DIAGNOSIS — I10 Essential (primary) hypertension: Secondary | ICD-10-CM

## 2023-10-08 MED ORDER — ISOSORBIDE MONONITRATE ER 30 MG PO TB24: 30.0000 mg | ORAL_TABLET | Freq: Two times a day (BID) | ORAL | 3 refills | Status: DC

## 2023-10-08 NOTE — Patient Instructions (Addendum)
 Medication Instructions:  Your physician recommends the following medication changes.  INCREASE: Imdur  to 30 mg by mouth twice a day   *If you need a refill on your cardiac medications before your next appointment, please call your pharmacy*  Lab Work: Your provider would like for you to have following labs drawn today (CBC, Lipid).     Testing/Procedures: No test ordered today   Follow-Up: At [facility], you and your health needs are our priority.  As part of our continuing mission to provide you with exceptional heart care, our providers are all part of one team.  This team includes your primary Cardiologist (physician) and Advanced Practice Providers or APPs (Physician Assistants and Nurse Practitioners) who all work together to provide you with the care you need, when you need it.  Your next appointment:   1 month(s)  Provider:   Timothy Gollan, MD or Cadence Franchester, PA-C

## 2023-10-08 NOTE — Progress Notes (Signed)
 Cardiology Office Note   Date:  10/08/2023  ID:  TRIGG DELAROCHA, DOB 1956-01-06, MRN [id number] PCP: Freddrick, No  [facility] Providers Cardiologist:  Evalene Lunger, MD    History of Present Illness CORNELL BOURBON is a 68 y.o. male with a hx of coronary artery disease status post PCI/DES to proximal left circumflex, hyperlipidemia, tobacco use, allergy to aspirin , PAD s/p right femoral endarterectomy and stenting who presents for hospital follow-up.   Patient has a history of CAD with STEMI in May 2016 with PCI and DES placement to the proximal left circumflex.  Cardiac cath in 2019 showed significant single-vessel CAD with 50% proximal and 90% distal RCA; RCA supplies relatively small PDA and PL branches.  Mild to moderate, nonobstructive CAD involving the left coronary artery with widely patent stent in the proximal left circumflex.  Medical therapy was recommended and Imdur  was added.  Refractory chest pain, can consider PCI to the RCA.  Echo showed LVEF 55 to 60%, grade 1 diastolic dysfunction.  The patient was recently admitted 6/21-6/24/25 for PAD. CTA was concerning for arterial occlusion so he underwent lower extremity angiography and PCI to the right external iliac and right common femoral arteries.  Patient did have rethrombosis of prior stents due to medication noncompliance.  He stopped taking his antiplatelets and Eliquis  due to financial reasons.  Vascular surgery recommended he continue Plavix  and Eliquis .  Today, the patient reports he is overall doing OK. He reports compliance with the blood thinners. He denies leg pain. He has occasional SOB. He denies chest pain. He quit smoking and has nicotine  patches. No lower leg edema. He says he is taking Toprol  50mg  daily. Discharge summary stopped Toprol  for low HR. EKG today shows NSR HR 66bpm.    Studies Reviewed EKG Interpretation Date/Time:  Monday October 08 2023 08:10:12 EDT Ventricular Rate:  66 PR  Interval:  146 QRS Duration:  88 QT Interval:  426 QTC Calculation: 446 R Axis:   53  Text Interpretation: Normal sinus rhythm Nonspecific ST abnormality When compared with ECG of 29-Sep-2023 21:22, PREVIOUS ECG IS PRESENT Confirmed by Franchester, Maikayla Beggs ([id number]) on 10/08/2023 8:17:27 AM    Eliquis  09/2023  1. Left ventricular ejection fraction, by estimation, is 55%. The left  ventricle has normal function. The left ventricle has no regional wall  motion abnormalities. Left ventricular diastolic parameters were normal.   2. Right ventricular systolic function is normal. The right ventricular  size is normal.   3. The mitral valve is normal in structure. No evidence of mitral valve  regurgitation.   4. The aortic valve is tricuspid. Aortic valve regurgitation is not  visualized. Aortic valve sclerosis/calcification is present, without any  evidence of aortic stenosis.   5. The inferior vena cava is normal in size with greater than 50%  respiratory variability, suggesting right atrial pressure of 3 mmHg.   LHC 01/2018 Conclusions: Significant single-vessel coronary artery disease, with 50% proximal and 80% distal RCA stenoses; RCA supplies relatively small PDA and PL branches. Mild to moderate, non-obstructive CAD involving the left coronary artery with widely patent stent in the proximal LCx. Normal left ventricular filling pressure.   Recommendations: Medical therapy of single-vessel coronary artery disease.  I add isosorbide  mononitrate 30 mg daily and continue metoprolol  tartrate 25 mg twice daily.  If patient has recurrent chest pain, PCI to distal RCA could be considered (will need to discuss aspirin  desensitization versus alternative antiplatelet strategies given aspirin  allergy). Aggressive secondary prevention, including  high-intensity statin therapy and smoking cessation. Monitor overnight.  If no further chest pain, likely discharge home tomorrow.   Recommend clopidogrel  75 mg  daily for moderate to severe CAD and history of aspirin  allergy.   Lonni Hanson, MD [facility] Pager: [phone]   Coronary Diagrams   Diagnostic Dominance: Right        Echo 01/2018 Study Conclusions   - Left ventricle: The cavity size was normal. There was mild    concentric hypertrophy. Systolic function was normal. The    estimated ejection fraction was in the range of 55% to 60%. Wall    motion was normal; there were no regional wall motion    abnormalities. Doppler parameters are consistent with abnormal    left ventricular relaxation (grade 1 diastolic dysfunction).  - Pulmonary arteries: Systolic pressure could not be accurately    estimated.       Physical Exam VS:  BP (!) 144/72 (BP Location: Left Arm, Patient Position: Sitting, Cuff Size: Normal)   Pulse 66   Ht 6' 2 (1.88 m)   Wt 196 lb 12.8 oz (89.3 kg)   SpO2 97%   BMI 25.27 kg/m        Wt Readings from Last 3 Encounters:  10/08/23 196 lb 12.8 oz (89.3 kg)  09/29/23 197 lb (89.4 kg)  09/12/23 195 lb 12.8 oz (88.8 kg)    GEN: Well nourished, well developed in no acute distress NECK: No JVD; No carotid bruits CARDIAC: RRR, no murmurs, rubs, gallops RESPIRATORY:  Clear to auscultation without rales, wheezing or rhonchi  ABDOMEN: Soft, non-tender, non-distended EXTREMITIES:  No edema; No deformity   ASSESSMENT AND PLAN  CADs/p PCI/DES pLCx SOB Patient reports persistent SOB with exertion. Cardiac PET stress was previously  ordered, but not performed. We will re-schedule this. Recent echo showed LVEF 55%, no WMA. He is on Plavix  and Eliquis  given recent peripheral arterial stent. He denies chest pain. Continue Lipitor , Imdur  and BB therapy. Breathing issues may be from COPD.  PAD s/p right femoral endarterectomy and prior stenting  Patient underwent right iliac stenting x2 in 01/2023. Recent admission for right lower extremity pain found to have arterial occlusion on CTA.he underwent lower  extremity angiography and was treated with PCI to the right external iliac and right common femoral arteries. Patient has h/o non-compliance with medications and rethrombosis of prior stents. He had stopped Eliquis  due to cost issues. He was restarted on Plavix  and Eliquis . He denies leg pain. He reports compliance with medications. CBC today.  HLD I will update a lipid panel today. Continue Lipitor   HTN Discharge summary says Toprol  was stopped for bradycardia, however patient says he is taking Toprol  50mg  daily. BP today is a little high. He says he is also taking Imdur . I asked him to bring in his medications next visit. I will increase Imdur  to 30mg  BID. Heart rate on EKG is 66bpm. He says heart rate at home can go down into the 50s.   Tobacco use He quit smoking and has the nicotine  patch.  Esophageal lesion Recommended GI follow-up. This was discussed with the patient.      Dispo: follow-up in 1 month  Signed, Tejasvi Brissett VEAR Fishman, PA-C

## 2023-10-09 ENCOUNTER — Ambulatory Visit: Payer: Self-pay | Admitting: Medical

## 2023-10-09 LAB — CBC
Hematocrit: 40.4 % (ref 37.5–51.0)
Hemoglobin: 13.6 g/dL (ref 13.0–17.7)
MCH: 31.7 pg (ref 26.6–33.0)
MCHC: 33.7 g/dL (ref 31.5–35.7)
MCV: 94 fL (ref 79–97)
Platelets: 205 10*3/uL (ref 150–450)
RBC: 4.29 x10E6/uL (ref 4.14–5.80)
RDW: 12.7 % (ref 11.6–15.4)
WBC: 4.9 10*3/uL (ref 3.4–10.8)

## 2023-10-09 LAB — LIPID PANEL
Chol/HDL Ratio: 5 ratio (ref 0.0–5.0)
Cholesterol, Total: 134 mg/dL (ref 100–199)
HDL: 27 mg/dL — ABNORMAL LOW (ref >39)
LDL Chol Calc (NIH): 85 mg/dL (ref 0–99)
Triglycerides: 123 mg/dL (ref 0–149)
VLDL Cholesterol Cal: 22 mg/dL (ref 5–40)

## 2023-10-09 MED ORDER — EZETIMIBE 10 MG PO TABS: 10.0000 mg | ORAL_TABLET | Freq: Every day | ORAL | 3 refills | Status: AC

## 2023-10-10 ENCOUNTER — Ambulatory Visit: Admitting: *Deleted

## 2023-10-10 ENCOUNTER — Encounter: Payer: Self-pay | Admitting: *Deleted

## 2023-10-10 DIAGNOSIS — F17210 Nicotine dependence, cigarettes, uncomplicated: Secondary | ICD-10-CM

## 2023-10-10 NOTE — Patient Instructions (Signed)

## 2023-10-10 NOTE — Progress Notes (Signed)
 Virtual Visit via Telephone Note  I connected with Aaron Knox on 10/10/23 at  1:30 PM EDT by telephone and verified that I am speaking with the correct person using two identifiers.  Location: Patient: in home Provider: [address] [place], [place], [address]    Shared Decision Making Visit Lung Cancer Screening Program [phone])   Eligibility: Age 68 y.o. Pack Years Smoking History Calculation 55 (# packs/per year x # years smoked) Recent History of coughing up blood  no Unexplained weight loss? no ( >Than 15 pounds within the last 6 months ) Prior History Lung / other cancer no (Diagnosis within the last 5 years already requiring surveillance chest CT Scans). Smoking Status Current Smoker Former Smokers: Years since quit: NA  Quit Date: NA  Visit Components: Discussion included one or more decision making aids. yes Discussion included risk/benefits of screening. yes Discussion included potential follow up diagnostic testing for abnormal scans. yes Discussion included meaning and risk of over diagnosis. yes Discussion included meaning and risk of False Positives. yes Discussion included meaning of total radiation exposure. yes  Counseling Included: Importance of adherence to annual lung cancer LDCT screening. yes Impact of comorbidities on ability to participate in the program. yes Ability and willingness to under diagnostic treatment. yes  Smoking Cessation Counseling: Current Smokers:  Discussed importance of smoking cessation. yes Information about tobacco cessation classes and interventions provided to patient. yes Patient provided with ticket for LDCT Scan. yes Symptomatic Patient. no  CounselingNA Diagnosis Code: Tobacco Use Z72.0 Asymptomatic Patient yes  Counseling (Intermediate counseling: > three minutes counseling) H9563 Former Smokers:  Discussed the importance of maintaining cigarette abstinence. yes Diagnosis Code: Personal History of  Nicotine  Dependence. S12.108 Information about tobacco cessation classes and interventions provided to patient. Yes Patient provided with ticket for LDCT Scan. yes Written Order for Lung Cancer Screening with LDCT placed in Epic. Yes (CT Chest Lung Cancer Screening Low Dose W/O CM) [id number] Z12.2-Screening of respiratory organs Z87.891-Personal history of nicotine  dependence   [name], RN 10/10/23

## 2023-10-15 ENCOUNTER — Ambulatory Visit
Admission: RE | Admit: 2023-10-15 | Discharge: 2023-10-15 | Disposition: A | Discharge status: Home / Self Care | Source: Ambulatory Visit | Attending: Acute Care | Admitting: Acute Care

## 2023-10-15 DIAGNOSIS — Z122 Encounter for screening for malignant neoplasm of respiratory organs: Secondary | ICD-10-CM | POA: Insufficient documentation

## 2023-10-15 DIAGNOSIS — F17210 Nicotine dependence, cigarettes, uncomplicated: Secondary | ICD-10-CM | POA: Diagnosis present

## 2023-10-15 DIAGNOSIS — Z87891 Personal history of nicotine dependence: Secondary | ICD-10-CM | POA: Insufficient documentation

## 2023-10-23 ENCOUNTER — Other Ambulatory Visit: Payer: Self-pay

## 2023-10-23 DIAGNOSIS — Z122 Encounter for screening for malignant neoplasm of respiratory organs: Secondary | ICD-10-CM

## 2023-10-23 DIAGNOSIS — Z87891 Personal history of nicotine dependence: Secondary | ICD-10-CM

## 2023-10-23 DIAGNOSIS — F17210 Nicotine dependence, cigarettes, uncomplicated: Secondary | ICD-10-CM

## 2023-10-30 ENCOUNTER — Other Ambulatory Visit (INDEPENDENT_AMBULATORY_CARE_PROVIDER_SITE_OTHER): Payer: Self-pay | Admitting: Vascular Surgery

## 2023-10-30 DIAGNOSIS — Z9889 Other specified postprocedural states: Secondary | ICD-10-CM

## 2023-11-01 ENCOUNTER — Encounter (INDEPENDENT_AMBULATORY_CARE_PROVIDER_SITE_OTHER): Payer: Self-pay | Admitting: Nurse Practitioner

## 2023-11-01 ENCOUNTER — Ambulatory Visit (INDEPENDENT_AMBULATORY_CARE_PROVIDER_SITE_OTHER)

## 2023-11-01 ENCOUNTER — Ambulatory Visit (INDEPENDENT_AMBULATORY_CARE_PROVIDER_SITE_OTHER): Admitting: Nurse Practitioner

## 2023-11-01 VITALS — BP 131/60 | HR 52 | Ht 74.0 in | Wt 193.0 lb

## 2023-11-01 DIAGNOSIS — I10 Essential (primary) hypertension: Secondary | ICD-10-CM

## 2023-11-01 DIAGNOSIS — I739 Peripheral vascular disease, unspecified: Secondary | ICD-10-CM | POA: Diagnosis not present

## 2023-11-01 DIAGNOSIS — Z9889 Other specified postprocedural states: Secondary | ICD-10-CM

## 2023-11-01 DIAGNOSIS — F17200 Nicotine dependence, unspecified, uncomplicated: Secondary | ICD-10-CM | POA: Diagnosis not present

## 2023-11-01 MED ORDER — APIXABAN 5 MG PO TABS: 5.0000 mg | ORAL_TABLET | Freq: Two times a day (BID) | ORAL | 3 refills | Status: DC

## 2023-11-01 NOTE — Progress Notes (Signed)
 Subjective:    Patient ID: Aaron Knox, male    DOB: May 22, 1955, 68 y.o.   MRN: [id number] Chief Complaint  Patient presents with   Schedule an appointment with Aaron Selinda RAMAN, MD (Vascular Sur    The patient returns to the office for followup and review status post angiogram with intervention on 10/01/2023.   Procedure: Procedure(s) Performed:             1.  Ultrasound guidance for vascular access left femoral artery             2.  Catheter placement into right SFA from left femoral approach             3.  Aortogram and selective right lower extremity angiogram             4.  Mechanical thrombectomy of the right external iliac artery, common femoral artery, and proximal superficial femoral artery with the penumbra 7 bolt device             5.  Angioplasty of the right SFA with 5 mm diameter Lutonix drug-coated angioplasty balloon             6.  Angioplasty of the right common femoral artery with 6 mm diameter Lutonix drug-coated angioplasty balloon             7.  Stent placement to the right external iliac artery with 8 mm diameter by 7.5 cm length Viabahn stent             8.  Stent placement to the right common femoral artery with 7 mm diameter by 7.5 cm length bone stent             9. Celt closure device left femoral artery   The patient notes improvement in the lower extremity symptoms. No interval shortening of the patient's claudication distance or rest pain symptoms. No new ulcers or wounds have occurred since the last visit.  He does however note a tenderness and discomfort in his right groin area.  He notes that he feels as if there is a swelling in the area and that when he lays down the area is tender and painful and throbbing especially if he lays in somewhat of a fetal position.  This pain did not occur until after his procedure.  There have been no significant changes to the patient's overall health care.  No documented history of amaurosis fugax or recent TIA  symptoms. There are no recent neurological changes noted. No documented history of DVT, PE or superficial thrombophlebitis. The patient denies recent episodes of angina or shortness of breath.   ABI's Rt=1.09 and Lt=1.00  (previous ABI's Rt=1.14 and Lt=1.11) Duplex US  of the triphasic tibial waveforms with good toe waveforms on the right.  He has triphasic/monophasic waveforms on the left also with good toe waveforms.    Review of Systems  Musculoskeletal:  Positive for arthralgias.  All other systems reviewed and are negative.      Objective:   Physical Exam Vitals reviewed.  HENT:     Head: Normocephalic.  Cardiovascular:     Rate and Rhythm: Normal rate.     Pulses:          Dorsalis pedis pulses are detected w/ Doppler on the right side and detected w/ Doppler on the left side.       Posterior tibial pulses are detected w/ Doppler on the right side and detected w/ Doppler on  the left side.  Pulmonary:     Effort: Pulmonary effort is normal.  Musculoskeletal:        General: Tenderness present.  Skin:    General: Skin is warm and dry.  Neurological:     Mental Status: He is alert and oriented to person, place, and time.  Psychiatric:        Mood and Affect: Mood normal.        Behavior: Behavior normal.        Thought Content: Thought content normal.        Judgment: Judgment normal.     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)    CAD in native artery    a.) LHC/PCI 08/09/2014 (setting of STEMI): 30% mLAD, 99% pLCx (3.0 x 15 mm Resolute Integrity DES x 1 with 10% residual post-intervention stenosis), 45% mLCx, 20% pRCA, 40% dRCA); b.) LHC 01/29/2018: 30% mLAD, 45% mLCx, 50% pRCA, 80% dRCA --> med mgmt   CHF (congestive heart failure) (HCC)    DDD (degenerative disc disease), lumbar    Diastolic dysfunction    a.)  TTE 08/11/2014 (s/p STEMI): EF 45-50%, inf HK, AoV sclerosis, G1DD; b.) TTE 02/28/2018: EF 55-60%, G1DD   History of bilateral cataract extraction 2023   HTN (hypertension)    Hyperlipidemia    Long term current use of antithrombotics/antiplatelets    a.) clopidogrel    ST elevation myocardial infarction (STEMI) of true posterior wall (HCC) 08/09/2014   a.) LHC/PCI 08/09/2014: 3.0 x 15 mm Resolute Integrity DES x 1 pLCx with 10% residual post-intervention stenosis)   Unstable angina (HCC)     Social History   Socioeconomic History   Marital status: Single    Spouse name: Not on file   Number of children: 1   Years of education: Not on file   Highest education level: Not on file  Occupational History   Not on file  Tobacco Use   Smoking status: Some Days    Current packs/day: 0.00    Average packs/day: 1.5 packs/day for 56.3 years (84.5 ttl pk-yrs)    Types: Cigarettes    Start date: 1968    Last attempt to quit: 08/2022    Years since quitting: 1.2   Smokeless tobacco: Never   Tobacco comments:    Smoked 1.5 ppd at his heaviest 09/12/23  Vaping Use   Vaping status: Every Day   Substances: Nicotine   Substance and Sexual Activity   Alcohol use: Yes    Alcohol/week: 3.0 standard drinks of alcohol    Types: 3 Glasses of wine per week    Comment: weekends   Drug use: No   Sexual activity: Not on file  Other Topics Concern   Not on file  Social History Narrative   Has fiance   Social Drivers of Health   Financial Resource Strain: Not on file  Food Insecurity: No Food Insecurity (09/30/2023)   Hunger Vital Sign    Worried About Running Out of Food in the Last Year: Never true    Ran Out of Food in the Last Year: Never true  Transportation Needs: No Transportation Needs (09/30/2023)   PRAPARE - Administrator, Civil Service (Medical): No    Lack of Transportation (Non-Medical): No  Physical  Activity: Not on file  Stress: Not on file  Social Connections:  Unknown (09/30/2023)   Social Connection and Isolation Panel    Frequency of Communication with Friends and Family: More than three times a week    Frequency of Social Gatherings with Friends and Family: Not on file    Attends Religious Services: Patient declined    Active Member of Clubs or Organizations: Patient declined    Attends Banker Meetings: Patient declined    Marital Status: Living with partner  Intimate Partner Violence: Unknown (09/30/2023)   Humiliation, Afraid, Rape, and Kick questionnaire    Fear of Current or Ex-Partner: No    Emotionally Abused: No    Physically Abused: Not on file    Sexually Abused: No    Past Surgical History:  Procedure Laterality Date   CARDIAC CATHETERIZATION N/A 08/09/2014   Procedure: Left Heart Cath and Coronary Angiography;  Surgeon: Deatrice DELENA Cage, MD;  Location: [facility];  Service: Cardiovascular;  Laterality: N/A;   CARDIAC CATHETERIZATION N/A 08/09/2014   Procedure: Coronary Stent Intervention;  Surgeon: Deatrice DELENA Cage, MD;  Location: [facility];  Service: Cardiovascular;  Laterality: N/A;   CATARACT EXTRACTION W/ INTRAOCULAR LENS  IMPLANT, BILATERAL Bilateral 2023   CORONARY ANGIOPLASTY  08/09/2014   ENDARTERECTOMY FEMORAL Right 09/20/2022   Procedure: ENDARTERECTOMY FEMORAL;  Surgeon: Aaron Selinda RAMAN, MD;  Location: [facility];  Service: Vascular;  Laterality: Right;   HAND SURGERY Left 1980   bone chip with nerve damage   INSERTION OF ILIAC STENT Right 09/20/2022   Procedure: INSERTION OF ILIAC STENT;  Surgeon: Aaron Selinda RAMAN, MD;  Location: [facility];  Service: Vascular;  Laterality: Right;   LEFT HEART CATH AND CORONARY ANGIOGRAPHY N/A 01/29/2018   Procedure: LEFT HEART CATH AND CORONARY ANGIOGRAPHY;  Surgeon: Mady Bruckner, MD;  Location: [facility];  Service: Cardiovascular;  Laterality: N/A;   LOWER EXTREMITY ANGIOGRAPHY Right 08/07/2022   Procedure: Lower Extremity Angiography;  Surgeon:  Aaron Selinda RAMAN, MD;  Location: [facility];  Service: Cardiovascular;  Laterality: Right;   LOWER EXTREMITY ANGIOGRAPHY Right 01/12/2023   Procedure: Lower Extremity Angiography;  Surgeon: Aaron Selinda RAMAN, MD;  Location: [facility];  Service: Cardiovascular;  Laterality: Right;   LOWER EXTREMITY ANGIOGRAPHY Right 10/01/2023   Procedure: Lower Extremity Angiography;  Surgeon: Aaron Selinda RAMAN, MD;  Location: [facility];  Service: Cardiovascular;  Laterality: Right;   nerve reconstruction Left 1980   hand    Family History  Problem Relation Age of Onset   CAD Maternal Grandfather     Allergies  Allergen Reactions   Aspirin  Anaphylaxis   Penicillins Other (See Comments)    Childhood allergy per pt's mother. He does not recall any details of the reaction. Willing to try beta lactams.       Latest Ref Rng & Units 10/08/2023    8:49 AM 10/02/2023    3:52 AM 10/01/2023    3:58 AM  CBC  WBC 3.4 - 10.8 x10E3/uL 4.9  5.2  5.7   Hemoglobin 13.0 - 17.7 g/dL 86.3  88.5  88.0   Hematocrit 37.5 - 51.0 % 40.4  34.1  35.8   Platelets 150 - 450 x10E3/uL 205  147  165       CMP     Component Value Date/Time   NA 140 10/02/2023 0352   K 3.7 10/02/2023 0352   CL 107 10/02/2023 0352  CO2 28 10/02/2023 0352   GLUCOSE 103 (H) 10/02/2023 0352   BUN 16 10/02/2023 0352   CREATININE 0.93 10/02/2023 0352   CALCIUM  8.5 (L) 10/02/2023 0352   PROT 6.1 (L) 09/30/2023 0527   ALBUMIN 3.5 09/30/2023 0527   AST 20 09/30/2023 0527   ALT 21 09/30/2023 0527   ALKPHOS 72 09/30/2023 0527   BILITOT 0.7 09/30/2023 0527   GFRNONAA >60 10/02/2023 0352     No results found.     Assessment & Plan:   1. Peripheral arterial disease with history of revascularization (HCC) (Primary) Today the noninvasive studies indicate that his interventions are widely patent however he is having some tenderness and discomfort in the groin area as well as when he moves certain ways or when he lies in that  area.  This could certainly be due to the closure device but I do want to have the patient return to ensure there is no evidence of pseudoaneurysm or dissection.  Will have him return shortly with an aortoiliac duplex as well as a right lower extremity arterial duplex. 2. Essential hypertension Continue antihypertensive medications as already ordered, these medications have been reviewed and there are no changes at this time.  3. Smoker I had a long discussion with the patient regarding his smoking.  While he has drastically decreased his amount of smoking, for which she is congratulated, smoking cessation in his case is going to be absolutely necessary to stop recurrent thrombosis of his stenting   Current Outpatient Medications on File Prior to Visit  Medication Sig Dispense Refill   atorvastatin  (LIPITOR ) 80 MG tablet Take 1 tablet (80 mg total) by mouth daily at 6 PM. 90 tablet 3   clopidogrel  (PLAVIX ) 75 MG tablet Take 1 tablet (75 mg total) by mouth daily. 30 tablet 11   ezetimibe  (ZETIA ) 10 MG tablet Take 1 tablet (10 mg total) by mouth daily. 90 tablet 3   isosorbide  mononitrate (IMDUR ) 30 MG 24 hr tablet Take 1 tablet (30 mg total) by mouth 2 (two) times daily. 180 tablet 3   nicotine  polacrilex (NICOTINE  MINI) 2 MG lozenge Take 1 lozenge (2 mg total) by mouth every 2 (two) hours as needed. 72 lozenge 3   pantoprazole  (PROTONIX ) 20 MG tablet Take 1 tablet (20 mg total) by mouth daily. (Patient not taking: Reported on 11/01/2023) 90 tablet 0   No current facility-administered medications on file prior to visit.    There are no Patient Instructions on file for this visit. No follow-ups on file.   Dedee Liss E Lynia Landry, NP

## 2023-11-05 LAB — VAS US ABI WITH/WO TBI
Left ABI: 1
Right ABI: 1.09

## 2023-11-05 IMAGING — MR MR LUMBAR SPINE W/O CM
5 series · 31 of 48 positions shown · non-contrast
Comparison: None.

CLINICAL DATA: Low back pain with right-sided radiculopathy for 9
months

EXAM:
MRI LUMBAR SPINE WITHOUT CONTRAST
TECHNIQUE: Multiplanar, multisequence MR imaging of the lumbar spine was
performed. No intravenous contrast was administered.

[Series 5: T2 · sagittal · 4.0mm · 0.81mm/px · 6 of 17 slices shown (1 of 2)]
[im 1/17]
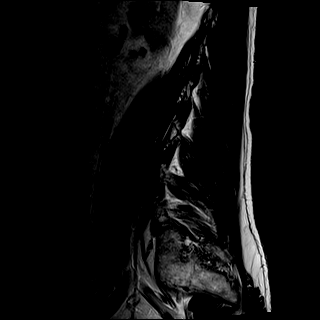
[im 4/17]
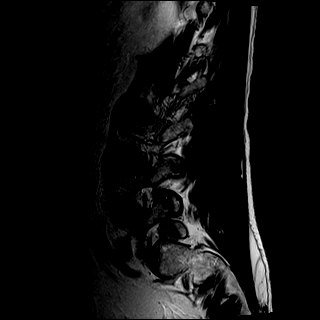
[im 7/17]
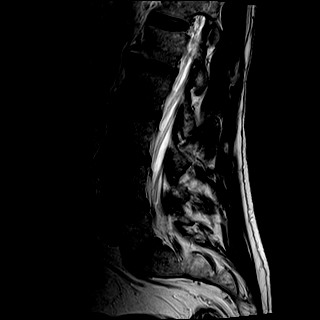
[im 10/17]
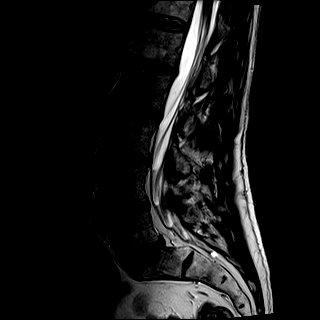
[im 13/17]
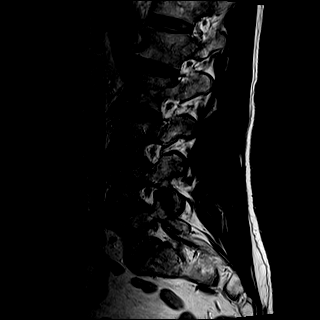
[im 17/17]
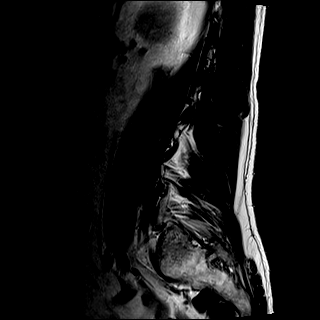

[Series 6: T1 · sagittal · 4.0mm · 0.81mm/px · 6 of 17 slices shown (1 of 2)]
[im 1/17]
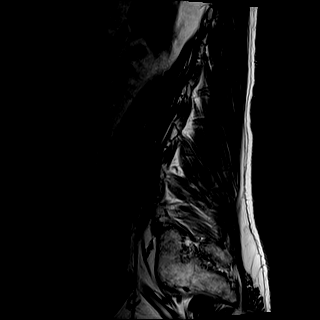
[im 4/17]
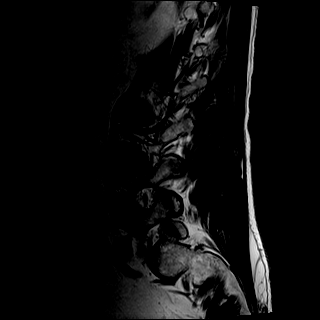
[im 7/17]
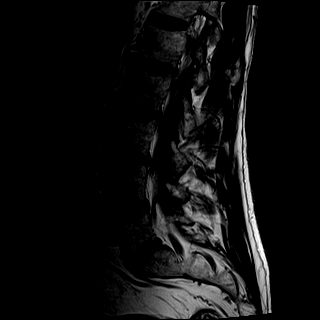
[im 10/17]
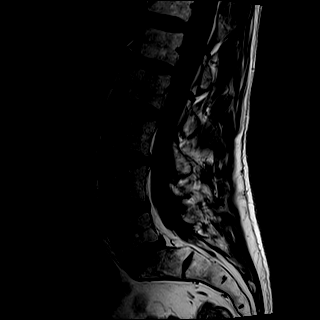
[im 13/17]
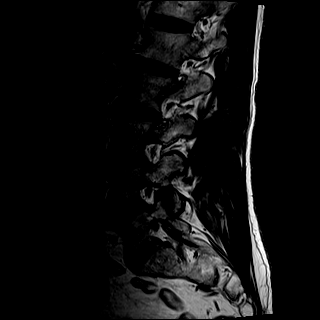
[im 17/17]
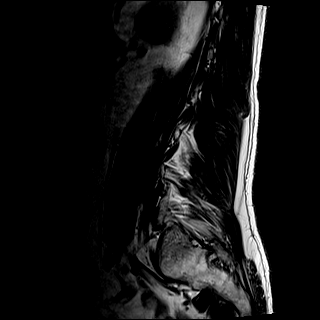

[Series 7: STIR · sagittal · 4.0mm · 0.41mm/px · 1 of 17 slices shown]
[im 1/17]
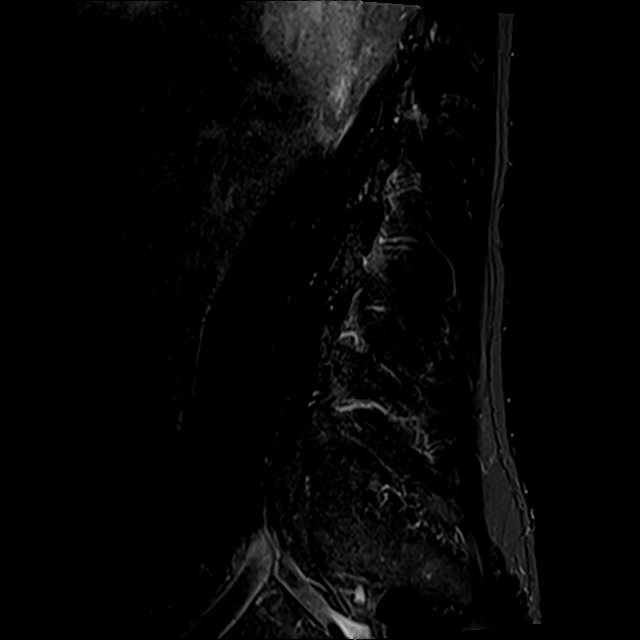

[Series 8: T2 · axial · 4.0mm · 0.78mm/px · z∈[-126,+116]mm · 9 of 39 slices shown (2 of 2)]
[im 1/39]
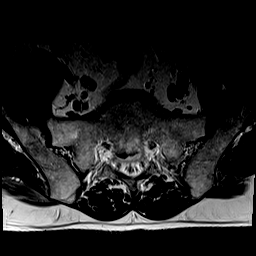
[im 6/39]
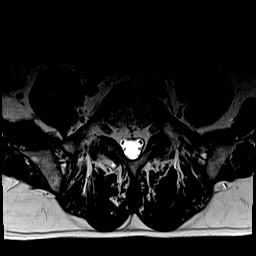
[im 11/39]
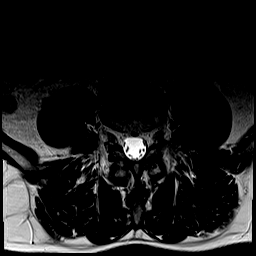
[im 17/39]
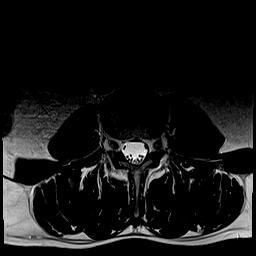
[im 20/39]
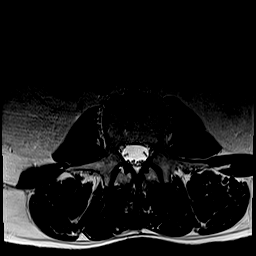
[im 22/39]
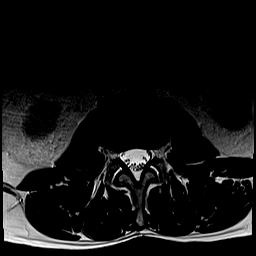
[im 28/39]
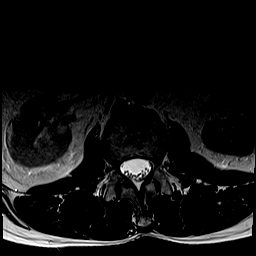
[im 33/39]
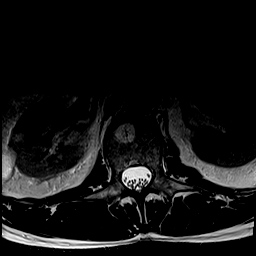
[im 39/39]
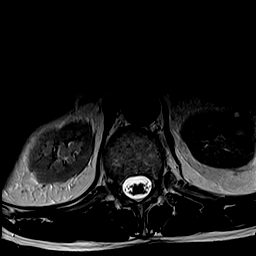

[Series 9: T1 · axial · 4.0mm · 0.39mm/px · z∈[-126,+116]mm · 9 of 39 slices shown (2 of 2)]
[im 1/39]
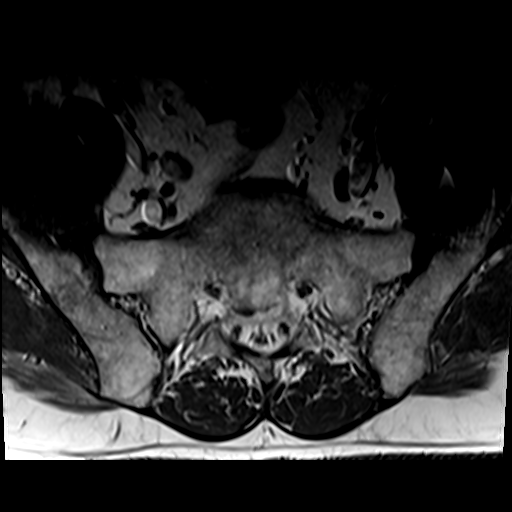
[im 6/39]
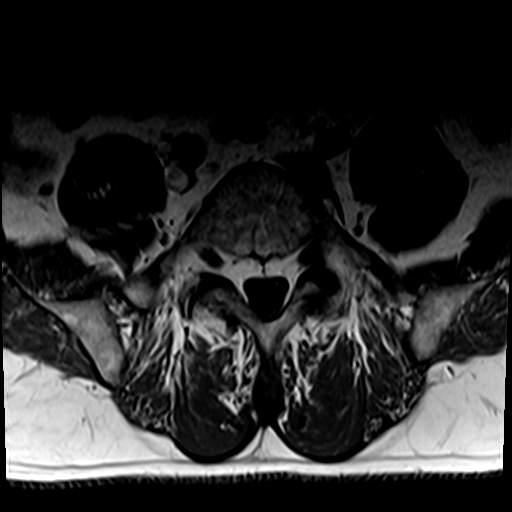
[im 11/39]
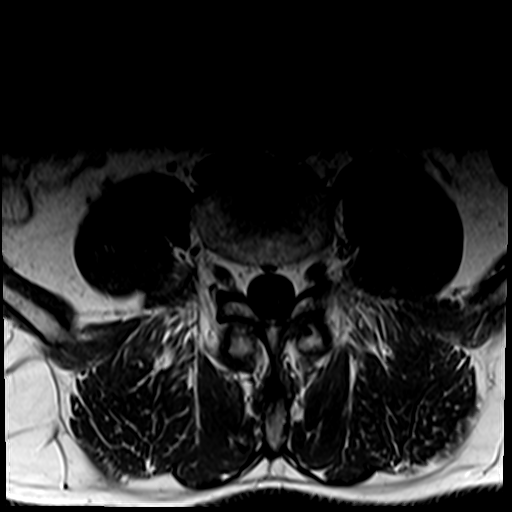
[im 17/39]
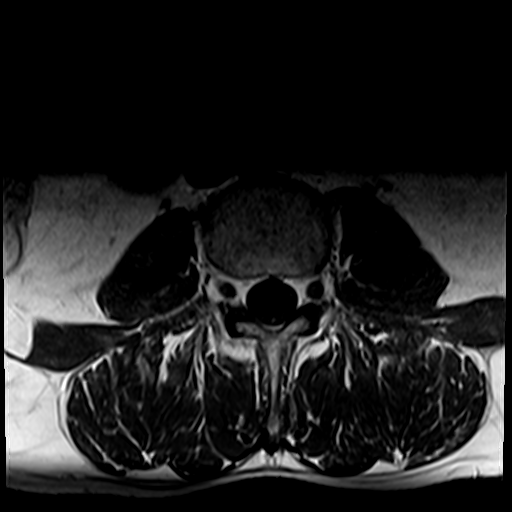
[im 20/39]
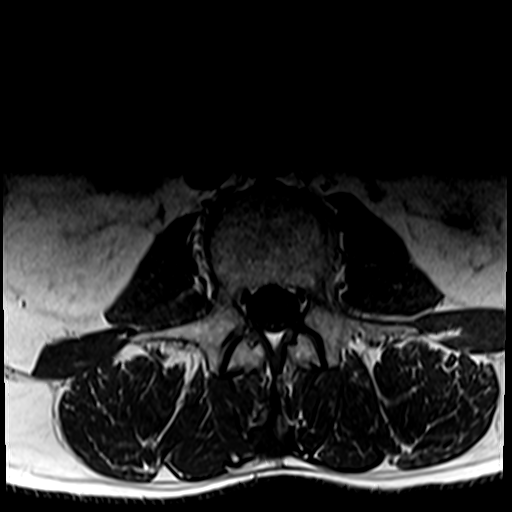
[im 22/39]
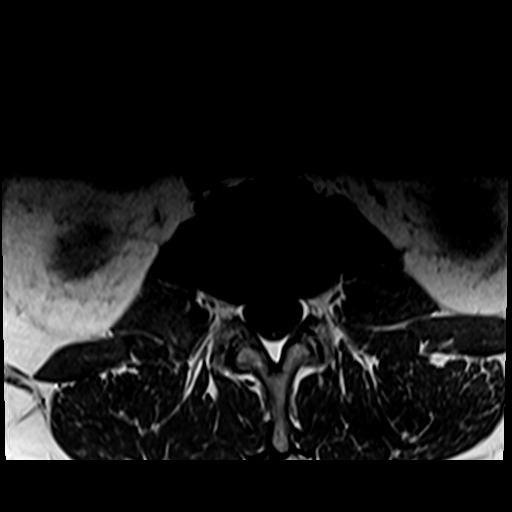
[im 28/39]
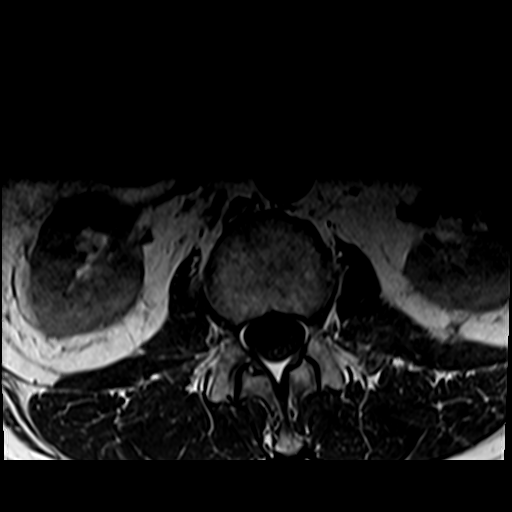
[im 33/39]
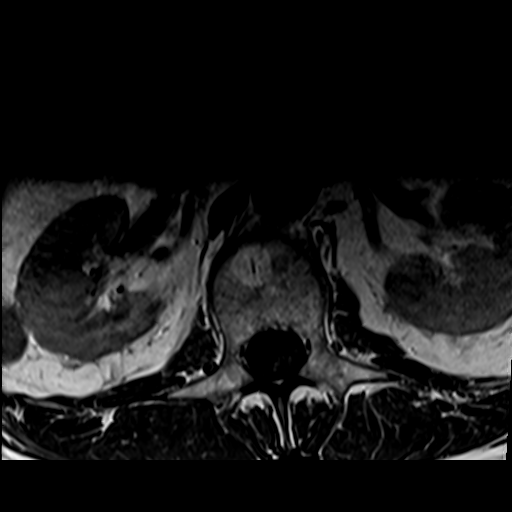
[im 39/39]
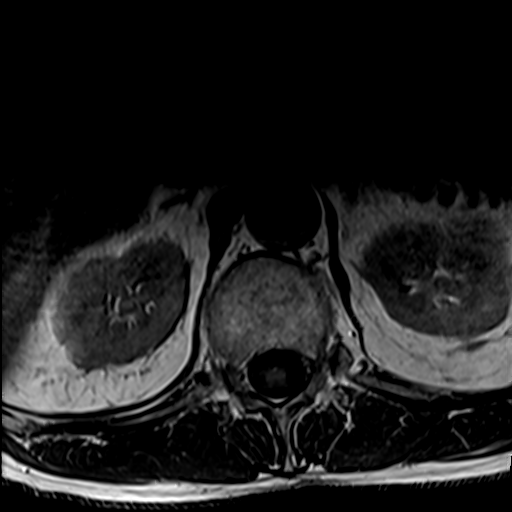

[31 of 48 positions shown; findings below may reference images not displayed]

FINDINGS: Segmentation:  Standard.

Alignment:  Physiologic.

Vertebrae: No fracture, evidence of discitis, or suspicious bone
lesion. L1 intraosseous hemangioma.

Conus medullaris and cauda equina: Conus extends to the L1 level.
Conus and cauda equina appear normal.

Paraspinal and other soft tissues: Cortically based T2 hyperintense
lesionswithin the bilateral kidneys, incompletely characterized, but
most likely represent cysts.

Disc levels:

T12-L1: Unremarkable.

L1-L2: Minimal annular disc bulge. Unremarkable facet joints. No
foraminal or canal stenosis.

L2-L3: Minimal annular disc bulge. Unremarkable facet joints. No
foraminal or canal stenosis.

L3-L4: Minimal annular disc bulge. Minimal bilateral facet
hypertrophy. No foraminal or canal stenosis.

L4-L5: Minimal annular disc bulge. Mild bilateral facet arthropathy.
No foraminal or canal stenosis.

L5-S1: Minimal disc bulge. Mild left facet hypertrophy. No foraminal
or canal stenosis.
IMPRESSION: Minimal degenerative changes of the lumbar spine without significant
foraminal or canal stenosis at any level. No findings to explain
patient's right-sided radicular symptoms.

## 2023-11-06 NOTE — Progress Notes (Unsigned)
  Electrophysiology Office Note:    Date:  11/07/2023   ID:  Aaron Knox, DOB 1955-04-21, MRN [id number]  [facility] Cardiologist:  Evalene Lunger, MD  [facility] Electrophysiologist:  OLE ONEIDA HOLTS, MD   Referring MD: Franchester Mikey DEL, PA-C   Chief Complaint: SVT  History of Present Illness:    Aaron Knox is a 68 year old man who I am seeing today for an evaluation of SVT at the request of Cadence Furth.  The patient last saw Cadence October 08, 2023.  He has a history of coronary artery disease with prior PCI, hyperlipidemia, tobacco use, peripheral vascular disease with a prior femoral endarterectomy and stenting.  He was admitted in June 2025 for peripheral vascular disease.  He underwent intervention to the right external iliac and common femoral artery.  He wore a heart monitor in June 2025.  This showed 172 episodes of SVT, longest lasting 34 seconds with an average heart rate of 120 bpm.  For this finding, he was referred to electrophysiology.  He is very active. 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 No syncope/presyncope. No CP today.    Their past medical, social and family history was reviewed.   ROS:   Please see the history of present illness.    All other systems reviewed and are negative.  EKGs/Labs/Other Studies Reviewed:    The following studies were reviewed today:  September 23, 2023 ZIO monitor personally reviewed Heart rate 41-214, average 70 172 nonsustained SVT, rhythm strip appears to be atrial tachycardia.  Longest episode lasting 34 seconds. Frequent supraventricular ectopy, 13% Rare ventricular ectopy  October 08, 2023 EKG shows sinus rhythm.  PR interval 146 ms.  QRS duration 88 ms.  September 13, 2023 echo EF 55% RV normal      Physical Exam:    VS:  BP (!) 106/50   Pulse 65   Ht 6' 2 (1.88 m)   Wt 196 lb (88.9 kg)   SpO2 98%   BMI 25.16 kg/m     Wt Readings from Last 3 Encounters:   11/07/23 196 lb (88.9 kg)  11/01/23 193 lb (87.5 kg)  10/15/23 196 lb (88.9 kg)     GEN: no distress CARD: RRR, No MRG RESP: No IWOB. CTAB.        ASSESSMENT AND PLAN:    1. SVT (supraventricular tachycardia) (HCC)   2. Coronary artery disease involving native heart with other form of angina pectoris, unspecified vessel or lesion type (HCC)   3. PAD (peripheral artery disease) (HCC)     #SVT #Palpitations #Atrial tachycardia The patient has frequent supraventricular ectopy and nonsustained SVT on his recent ZIO monitor.  I discussed treatment options for atrial tachycardia.  I discussed the pathophysiology of atrial tachycardia.  Given his past medical history including severe coronary artery disease, I have recommended a more conservativ management strategy.  I have recommended metoprolol  succinate 25 mg by mouth daily.  #Coronary artery disease No ischemic symptoms today  #Peripheral vascular disease Continue Eliquis  and Plavix   Follow-up with EP APP in 3 months.    Signed, OLE ONEIDA. HOLTS, MD, [facility], [facility] 11/07/2023 7:52 AM    Electrophysiology [facility]

## 2023-11-07 ENCOUNTER — Encounter: Payer: Self-pay | Admitting: Cardiology

## 2023-11-07 ENCOUNTER — Ambulatory Visit: Attending: Cardiology | Admitting: Cardiology

## 2023-11-07 ENCOUNTER — Other Ambulatory Visit: Payer: Self-pay

## 2023-11-07 VITALS — BP 106/50 | HR 65 | Ht 74.0 in | Wt 196.0 lb

## 2023-11-07 DIAGNOSIS — I471 Supraventricular tachycardia, unspecified: Secondary | ICD-10-CM

## 2023-11-07 DIAGNOSIS — I25118 Atherosclerotic heart disease of native coronary artery with other forms of angina pectoris: Secondary | ICD-10-CM | POA: Diagnosis not present

## 2023-11-07 DIAGNOSIS — I739 Peripheral vascular disease, unspecified: Secondary | ICD-10-CM | POA: Diagnosis not present

## 2023-11-07 MED ORDER — PANTOPRAZOLE SODIUM 20 MG PO TBEC: 20.0000 mg | DELAYED_RELEASE_TABLET | Freq: Every day | ORAL | 3 refills | Status: AC

## 2023-11-07 MED ORDER — METOPROLOL SUCCINATE ER 25 MG PO TB24: 25.0000 mg | ORAL_TABLET | Freq: Every day | ORAL | 3 refills | Status: AC

## 2023-11-07 NOTE — Patient Instructions (Signed)
 Medication Instructions:  Your physician has recommended you make the following change in your medication: 1 1) START taking Toprol  XL (metoprolol  succinate) 25 mg once daily  *If you need a refill on your cardiac medications before your next appointment, please call your pharmacy  Follow-Up: At [facility], you and your health needs are our priority.  As part of our continuing mission to provide you with exceptional heart care, our providers are all part of one team.  This team includes your primary Cardiologist (physician) and Advanced Practice Providers or APPs (Physician Assistants and Nurse Practitioners) who all work together to provide you with the care you need, when you need it.  Your next appointment:   3 months  Provider:   Suzann Riddle, NP

## 2023-11-08 ENCOUNTER — Ambulatory Visit: Attending: Medical | Admitting: Medical

## 2023-11-08 VITALS — BP 112/60 | HR 66 | Ht 74.0 in | Wt 194.0 lb

## 2023-11-08 DIAGNOSIS — I25118 Atherosclerotic heart disease of native coronary artery with other forms of angina pectoris: Secondary | ICD-10-CM | POA: Diagnosis not present

## 2023-11-08 DIAGNOSIS — E782 Mixed hyperlipidemia: Secondary | ICD-10-CM | POA: Diagnosis not present

## 2023-11-08 DIAGNOSIS — Z720 Tobacco use: Secondary | ICD-10-CM

## 2023-11-08 DIAGNOSIS — I10 Essential (primary) hypertension: Secondary | ICD-10-CM | POA: Diagnosis not present

## 2023-11-08 DIAGNOSIS — I739 Peripheral vascular disease, unspecified: Secondary | ICD-10-CM

## 2023-11-08 NOTE — Patient Instructions (Addendum)
 Medication Instructions:  Your physician recommends that you continue on your current medications as directed. Please refer to the Current Medication list given to you today.    *If you need a refill on your cardiac medications before your next appointment, please call your pharmacy*  Lab Work: No labs ordered today    Testing/Procedures: No test ordered today   Follow-Up: At [facility], you and your health needs are our priority.  As part of our continuing mission to provide you with exceptional heart care, our providers are all part of one team.  This team includes your primary Cardiologist (physician) and Advanced Practice Providers or APPs (Physician Assistants and Nurse Practitioners) who all work together to provide you with the care you need, when you need it.  Your next appointment:   6 month(s)  Provider:   You may see Timothy Gollan, MD or one of the following Advanced Practice Providers on your designated Care Team:   Cadence [place], PA-C

## 2023-11-08 NOTE — Progress Notes (Signed)
 Cardiology Office Note   Date:  11/08/2023  ID:  Aaron Knox, DOB 03-24-1956, MRN [id number] PCP: Pcp, No  [facility] Providers Cardiologist:  Evalene Lunger, MD Electrophysiologist:  OLE ONEIDA HOLTS, MD   History of Present Illness Aaron Knox is a 68 y.o. male with a hx of coronary artery disease status post PCI/DES to proximal left circumflex, hyperlipidemia, tobacco use, allergy to aspirin , PAD s/p right femoral endarterectomy and stenting who presents for follow-up.   Patient has a history of CAD with STEMI in May 2016 with PCI and DES placement to the proximal left circumflex.  Cardiac cath in 2019 showed significant single-vessel CAD with 50% proximal and 90% distal RCA; RCA supplies relatively small PDA and PL branches.  Mild to moderate, nonobstructive CAD involving the left coronary artery with widely patent stent in the proximal left circumflex.  Medical therapy was recommended and Imdur  was added.  Refractory chest pain, can consider PCI to the RCA.  Echo showed LVEF 55 to 60%, grade 1 diastolic dysfunction.  Patient was seen 07/2023 reporting SOB with exertion and cardiac PET stress was ordered, but not performed. Elevated HR was reported and a 2 week heart monitor was ordered. This showed 172 runs of pSVT, 13.4% PVC and he was referred to EP.    The patient was recently admitted 6/21-6/24/25 for PAD. CTA was concerning for arterial occlusion so he underwent lower extremity angiography and PCI to the right external iliac and right common femoral arteries.  Patient did have rethrombosis of prior stents due to medication noncompliance.  He stopped taking his antiplatelets and Eliquis  due to financial reasons.  Vascular surgery recommended he continue Plavix  and Eliquis .  Patient was last seen 11/07/2023 by Dr. HOLTS for SVT and metoprolol  was increased.   Today, the patient reports orthostatic symptoms. He feels dizzy, lightheaded and SOB when he stands up.  Once he gets his bearings he is fine and can walk a long way. He had one episode of tachycardia, but he was pushing himself out in the heat. He stays hydrated and takes break. He drinks a lot of Gatorade. He denies chest pain.   Studies Reviewed      Echo 09/2023  1. Left ventricular ejection fraction, by estimation, is 55%. The left  ventricle has normal function. The left ventricle has no regional wall  motion abnormalities. Left ventricular diastolic parameters were normal.   2. Right ventricular systolic function is normal. The right ventricular  size is normal.   3. The mitral valve is normal in structure. No evidence of mitral valve  regurgitation.   4. The aortic valve is tricuspid. Aortic valve regurgitation is not  visualized. Aortic valve sclerosis/calcification is present, without any  evidence of aortic stenosis.   5. The inferior vena cava is normal in size with greater than 50%  respiratory variability, suggesting right atrial pressure of 3 mmHg.    [facility] 01/2018 Conclusions: Significant single-vessel coronary artery disease, with 50% proximal and 80% distal RCA stenoses; RCA supplies relatively small PDA and PL branches. Mild to moderate, non-obstructive CAD involving the left coronary artery with widely patent stent in the proximal LCx. Normal left ventricular filling pressure.   Recommendations: Medical therapy of single-vessel coronary artery disease.  I add isosorbide  mononitrate 30 mg daily and continue metoprolol  tartrate 25 mg twice daily.  If patient has recurrent chest pain, PCI to distal RCA could be considered (will need to discuss aspirin  desensitization versus alternative antiplatelet strategies given  aspirin  allergy). Aggressive secondary prevention, including high-intensity statin therapy and smoking cessation. Monitor overnight.  If no further chest pain, likely discharge home tomorrow.   Recommend clopidogrel  75 mg daily for moderate to severe CAD and  history of aspirin  allergy.   Lonni Hanson, MD [facility] Pager: [phone]   Coronary Diagrams   Diagnostic Dominance: Right        Echo 01/2018 Study Conclusions   - Left ventricle: The cavity size was normal. There was mild    concentric hypertrophy. Systolic function was normal. The    estimated ejection fraction was in the range of 55% to 60%. Wall    motion was normal; there were no regional wall motion    abnormalities. Doppler parameters are consistent with abnormal    left ventricular relaxation (grade 1 diastolic dysfunction).  - Pulmonary arteries: Systolic pressure could not be accurately    estimated.        Physical Exam VS:  BP 112/60 (BP Location: Left Arm, Patient Position: Sitting, Cuff Size: Normal)   Pulse 66   Ht 6' 2 (1.88 m)   Wt 194 lb (88 kg)   SpO2 96%   BMI 24.91 kg/m        Wt Readings from Last 3 Encounters:  11/08/23 194 lb (88 kg)  11/07/23 196 lb (88.9 kg)  11/01/23 193 lb (87.5 kg)    GEN: Well nourished, well developed in no acute distress NECK: No JVD; No carotid bruits CARDIAC: RRR, no murmurs, rubs, gallops RESPIRATORY:  Clear to auscultation without rales, wheezing or rhonchi  ABDOMEN: Soft, non-tender, non-distended EXTREMITIES:  No edema; No deformity   ASSESSMENT AND PLAN  CAD s/p PCI/DES LCx Cardiac PET stress test has been rescheduled twice for SOB. Patient reports shortness of breath when he initially stands up, but feels fine when he walks. He denies chest pain. He would like to wait on stress test at this time and continue to monitor symptoms. Echo showed LVEF 55%, no WMA. He is on Eliquis  and Plavix  per [facility].  Continue Lipitor , Imdur  and BB therapy.   PAD s/p right femoral endarterectomy and prior stenting He has persistent right thigh and numbness and pain in the right calf. Dr. Marea plans on looking at further investigating this in August. No pain on the left side. He is on Eliquis  and Plavix . Continue  Lipitor  and Zetia .   HLD LDL 85. Continue Lipitor  80mg  daily and Zetia  10mg  daily. Discussed Repatha, but will wait at this time.   HTN BP is normal, continue current medications. He reports occasional orthostatic symptoms and is cautious every time he stands up. Continue Imdur  30mg  BID and Toprol  25mg  daily.   Tobacco use He smokes 4 cigarettes daily, cessation was recommended.        Dispo: follow-up in 6 months  Signed, Creig Landin VEAR Fishman, PA-C

## 2023-11-13 ENCOUNTER — Emergency Department

## 2023-11-13 ENCOUNTER — Observation Stay
Admission: EM | Admit: 2023-11-13 | Discharge: 2023-11-15 | Disposition: A | Discharge status: Home / Self Care | Attending: Vascular Surgery | Admitting: Vascular Surgery

## 2023-11-13 DIAGNOSIS — Z7901 Long term (current) use of anticoagulants: Secondary | ICD-10-CM | POA: Diagnosis not present

## 2023-11-13 DIAGNOSIS — R9389 Abnormal findings on diagnostic imaging of other specified body structures: Principal | ICD-10-CM

## 2023-11-13 DIAGNOSIS — Z79899 Other long term (current) drug therapy: Secondary | ICD-10-CM | POA: Diagnosis not present

## 2023-11-13 DIAGNOSIS — K229 Disease of esophagus, unspecified: Secondary | ICD-10-CM | POA: Diagnosis present

## 2023-11-13 DIAGNOSIS — F17290 Nicotine dependence, other tobacco product, uncomplicated: Secondary | ICD-10-CM | POA: Diagnosis not present

## 2023-11-13 DIAGNOSIS — I2510 Atherosclerotic heart disease of native coronary artery without angina pectoris: Secondary | ICD-10-CM | POA: Insufficient documentation

## 2023-11-13 DIAGNOSIS — I509 Heart failure, unspecified: Secondary | ICD-10-CM | POA: Insufficient documentation

## 2023-11-13 DIAGNOSIS — I70221 Atherosclerosis of native arteries of extremities with rest pain, right leg: Principal | ICD-10-CM | POA: Insufficient documentation

## 2023-11-13 DIAGNOSIS — I110 Hypertensive heart disease with heart failure: Secondary | ICD-10-CM | POA: Diagnosis not present

## 2023-11-13 DIAGNOSIS — J432 Centrilobular emphysema: Secondary | ICD-10-CM | POA: Diagnosis not present

## 2023-11-13 DIAGNOSIS — Z7902 Long term (current) use of antithrombotics/antiplatelets: Secondary | ICD-10-CM | POA: Insufficient documentation

## 2023-11-13 DIAGNOSIS — M79604 Pain in right leg: Secondary | ICD-10-CM | POA: Diagnosis present

## 2023-11-13 DIAGNOSIS — I10 Essential (primary) hypertension: Secondary | ICD-10-CM | POA: Diagnosis present

## 2023-11-13 DIAGNOSIS — I70229 Atherosclerosis of native arteries of extremities with rest pain, unspecified extremity: Secondary | ICD-10-CM | POA: Diagnosis present

## 2023-11-13 DIAGNOSIS — Z95820 Peripheral vascular angioplasty status with implants and grafts: Secondary | ICD-10-CM | POA: Diagnosis not present

## 2023-11-13 DIAGNOSIS — I252 Old myocardial infarction: Secondary | ICD-10-CM | POA: Diagnosis not present

## 2023-11-13 DIAGNOSIS — F17210 Nicotine dependence, cigarettes, uncomplicated: Secondary | ICD-10-CM | POA: Insufficient documentation

## 2023-11-13 DIAGNOSIS — I739 Peripheral vascular disease, unspecified: Secondary | ICD-10-CM

## 2023-11-13 LAB — BASIC METABOLIC PANEL WITH GFR
Anion gap: 9 (ref 5–15)
BUN: 9 mg/dL (ref 8–23)
CO2: 25 mmol/L (ref 22–32)
Calcium: 9.1 mg/dL (ref 8.9–10.3)
Chloride: 105 mmol/L (ref 98–111)
Creatinine, Ser: 0.95 mg/dL (ref 0.61–1.24)
GFR, Estimated: >60 mL/min (ref >60)
Glucose, Bld: 108 mg/dL — ABNORMAL HIGH (ref 70–99)
Potassium: 3.5 mmol/L (ref 3.5–5.1)
Sodium: 139 mmol/L (ref 135–145)

## 2023-11-13 LAB — CBC WITH DIFFERENTIAL/PLATELET
Abs Immature Granulocytes: 0.01 K/uL (ref 0.00–0.07)
Basophils Absolute: 0 K/uL (ref 0.0–0.1)
Basophils Relative: 1 %
Eosinophils Absolute: 0.1 K/uL (ref 0.0–0.5)
Eosinophils Relative: 3 %
HCT: 35.6 % — ABNORMAL LOW (ref 39.0–52.0)
Hemoglobin: 12.1 g/dL — ABNORMAL LOW (ref 13.0–17.0)
Immature Granulocytes: 0 %
Lymphocytes Relative: 17 %
Lymphs Abs: 0.8 K/uL (ref 0.7–4.0)
MCH: 31.3 pg (ref 26.0–34.0)
MCHC: 34 g/dL (ref 30.0–36.0)
MCV: 92.2 fL (ref 80.0–100.0)
Monocytes Absolute: 0.4 K/uL (ref 0.1–1.0)
Monocytes Relative: 9 %
Neutro Abs: 3.3 K/uL (ref 1.7–7.7)
Neutrophils Relative %: 70 %
Platelets: 181 K/uL (ref 150–400)
RBC: 3.86 MIL/uL — ABNORMAL LOW (ref 4.22–5.81)
RDW: 12.9 % (ref 11.5–15.5)
WBC: 4.6 K/uL (ref 4.0–10.5)
nRBC: 0 % (ref 0.0–0.2)

## 2023-11-13 LAB — PROTIME-INR
INR: 1.2 (ref 0.8–1.2)
Prothrombin Time: 15.4 s — ABNORMAL HIGH (ref 11.4–15.2)

## 2023-11-13 MED ORDER — ACETAMINOPHEN 650 MG RE SUPP: 650.0000 mg | Freq: Four times a day (QID) | RECTAL | Status: DC | PRN

## 2023-11-13 MED ORDER — ISOSORBIDE MONONITRATE ER 30 MG PO TB24
30.0000 mg | ORAL_TABLET | Freq: Two times a day (BID) | ORAL | Status: DC
  Administered 2023-11-13 – 2023-11-15 (×4): 30 mg via ORAL
  Filled 2023-11-13 (×4): qty 1

## 2023-11-13 MED ORDER — NICOTINE POLACRILEX 2 MG MT GUM: 2.0000 mg | CHEWING_GUM | OROMUCOSAL | Status: DC | PRN

## 2023-11-13 MED ORDER — IOHEXOL 350 MG/ML SOLN
125.0000 mL | Freq: Once | INTRAVENOUS | Status: AC | PRN
  Administered 2023-11-13: 125 mL via INTRAVENOUS

## 2023-11-13 MED ORDER — ATORVASTATIN CALCIUM 80 MG PO TABS
80.0000 mg | ORAL_TABLET | Freq: Every day | ORAL | Status: DC
  Administered 2023-11-13 – 2023-11-14 (×2): 80 mg via ORAL
  Filled 2023-11-13: qty 4
  Filled 2023-11-13: qty 1

## 2023-11-13 MED ORDER — CLOPIDOGREL BISULFATE 75 MG PO TABS
75.0000 mg | ORAL_TABLET | Freq: Every day | ORAL | Status: DC
  Administered 2023-11-14 – 2023-11-15 (×2): 75 mg via ORAL
  Filled 2023-11-13 (×2): qty 1

## 2023-11-13 MED ORDER — MORPHINE SULFATE (PF) 2 MG/ML IV SOLN: 2.0000 mg | INTRAVENOUS | Status: DC | PRN

## 2023-11-13 MED ORDER — ONDANSETRON HCL 4 MG PO TABS: 4.0000 mg | ORAL_TABLET | Freq: Four times a day (QID) | ORAL | Status: DC | PRN

## 2023-11-13 MED ORDER — ONDANSETRON HCL 4 MG/2ML IJ SOLN: 4.0000 mg | Freq: Four times a day (QID) | INTRAMUSCULAR | Status: DC | PRN

## 2023-11-13 MED ORDER — HYDROCODONE-ACETAMINOPHEN 5-325 MG PO TABS: 1.0000 | ORAL_TABLET | ORAL | Status: DC | PRN

## 2023-11-13 MED ORDER — EZETIMIBE 10 MG PO TABS
10.0000 mg | ORAL_TABLET | Freq: Every day | ORAL | Status: DC
  Administered 2023-11-14 – 2023-11-15 (×2): 10 mg via ORAL
  Filled 2023-11-13 (×2): qty 1

## 2023-11-13 MED ORDER — ACETAMINOPHEN 325 MG PO TABS: 650.0000 mg | ORAL_TABLET | Freq: Four times a day (QID) | ORAL | Status: DC | PRN

## 2023-11-13 MED ORDER — ALBUTEROL SULFATE (2.5 MG/3ML) 0.083% IN NEBU: 2.5000 mg | INHALATION_SOLUTION | RESPIRATORY_TRACT | Status: DC | PRN

## 2023-11-13 MED ORDER — APIXABAN 5 MG PO TABS
5.0000 mg | ORAL_TABLET | Freq: Two times a day (BID) | ORAL | Status: DC
  Administered 2023-11-13 – 2023-11-15 (×4): 5 mg via ORAL
  Filled 2023-11-13 (×4): qty 1

## 2023-11-13 MED ORDER — PANTOPRAZOLE SODIUM 20 MG PO TBEC
20.0000 mg | DELAYED_RELEASE_TABLET | Freq: Every day | ORAL | Status: DC
  Administered 2023-11-14 – 2023-11-15 (×2): 20 mg via ORAL
  Filled 2023-11-13 (×2): qty 1

## 2023-11-13 NOTE — Assessment & Plan Note (Signed)
 DuoNebs as needed

## 2023-11-13 NOTE — Assessment & Plan Note (Signed)
 No complaints of chest pain Continue apixaban , atorvastatin , ezetimibe , Imdur  Metoprolol  was held in June due to borderline bradycardia-will continue to hold as heart rate around 60

## 2023-11-13 NOTE — Assessment & Plan Note (Signed)
 Incidental finding on CT 09/2023 Continue with outpatient GI follow-up

## 2023-11-13 NOTE — Assessment & Plan Note (Addendum)
 History of stent placement left leg 09/2022 with restenosis 09/2023 History of medication noncompliance due to affordability CTA showing patent stents but with debris Vascular consult from ED recommending continuing Eliquis  and Plavix , no heparin  for now Patient is neurovascularly intact Continue Eliquis , Plavix , atorvastatin  and ezetimibe  Pain control Vascular surgery is planning to take him to vascular lab tomorrow morning

## 2023-11-13 NOTE — ED Provider Notes (Signed)
 [facility] Provider Note    Event Date/Time   First MD Initiated Contact with Patient 11/13/23 1558     (approximate)   History   Tingling and Leg Pain  Pt c/o R leg pain and tingling.  Pain score 8/10.  Hx of same w/ multiple clots and stents.  Pt reports it's same symptoms I had with the other blood clots.       HPI Aaron Knox is a 68 y.o. male PMH CHF, prior STEMI with DES to left circumflex PAD status post right femoral artery endarterectomy with iliac stent placement (09/2022) on Eliquis , hypertension, hyperlipidemia, emphysema presents for evaluation of right leg discomfort - Present for the past 2 days, says it feels very similar to the discomfort he had during his stent thromboses.  Has been taking his Eliquis  and Plavix  as prescribed.  No preceding trauma.  No back pain.  Notes he primarily feels a tingling and numbness sensation that extends from about the knee down, somewhat circumferential.  Denies any weakness.   Per chart review, she was recently admitted in June of this year for right lower extremity pain and tingling, CTA concerning for arterial occlusion, vascular consulted, underwent lower extremity angiography and PCI showing rethrombosis of prior stents due to medication nonadherence.  Discharged with plan to continue Eliquis  and Plavix .      Physical Exam   Triage Vital Signs: ED Triage Vitals  Encounter Vitals Group     BP 11/13/23 1413 (!) 173/74     Girls Systolic BP Percentile --      Girls Diastolic BP Percentile --      Boys Systolic BP Percentile --      Boys Diastolic BP Percentile --      Pulse Rate 11/13/23 1413 67     Resp 11/13/23 1413 18     Temp 11/13/23 1413 97.9 F (36.6 C)     Temp Source 11/13/23 1413 Oral     SpO2 11/13/23 1413 97 %     Weight 11/13/23 1414 194 lb (88 kg)     Height 11/13/23 1414 6' 2 (1.88 m)     Head Circumference --      Peak Flow --      Pain Score 11/13/23 1414 8     Pain  Loc --      Pain Education --      Exclude from Growth Chart --     Most recent vital signs: Vitals:   11/13/23 1700 11/13/23 1710  BP: (!) 141/72   Pulse: 62 60  Resp: (!) 22 17  Temp:    SpO2: 100% 98%     General: Awake, no distress.  CV:  Good peripheral perfusion. RRR, RP 2+ Resp:  Normal effort. CTAB Abd:  No distention. Nontender to deep palpation throughout Other:  Pedal +2+ in right foot and equal bilaterally.  Wiggles all toes without difficulty.  No overlying skin changes.  Negative straight leg raise on right.   ED Results / Procedures / Treatments   Labs (all labs ordered are listed, but only abnormal results are displayed) Labs Reviewed  CBC WITH DIFFERENTIAL/PLATELET - Abnormal; Notable for the following components:      Result Value   RBC 3.86 (*)    Hemoglobin 12.1 (*)    HCT 35.6 (*)    All other components within normal limits  BASIC METABOLIC PANEL WITH GFR - Abnormal; Notable for the following components:   Glucose, Bld  108 (*)    All other components within normal limits  PROTIME-INR - Abnormal; Notable for the following components:   Prothrombin Time 15.4 (*)    All other components within normal limits     EKG  N/a   RADIOLOGY Etiology interpreted by myself and radiology report reviewed.  No occlusion of stents though patient does have nonocclusive debris in the proximal to mid right SFA.      PROCEDURES:  Critical Care performed: No  Procedures   MEDICATIONS ORDERED IN ED: Medications  iohexol  (OMNIPAQUE ) 350 MG/ML injection 125 mL (125 mLs Intravenous Contrast Given 11/13/23 1737)     IMPRESSION / MDM / ASSESSMENT AND PLAN / ED COURSE  I reviewed the triage vital signs and the nursing notes.                              DDX/MDM/AP: Differential diagnosis includes, but is not limited to, stent reocclusion despite reassuring pulses here, consider partial occlusion, exam not consistent with sciatica, consider MSK strain.  Do  not clinically suspect CVA.  Plan: - Labs - Will discuss with vascular surgery as to what imaging modality would be most appropriate  Patient's presentation is most consistent with acute presentation with potential threat to life or bodily function.  ED course below.  Discussed with vascular surgery, recommended CTA.  CTA subsequently with no stent rethrombosis but does have some nonocclusive intraluminal debris.  Discussed with vascular again who does agree with admission and will evaluate patient in morning to determine if further intervention or diagnostics necessary.  Continue Eliquis  and Plavix  in the meantime, no indication for heparin .  Admitted to hospitalist service.  Clinical Course as of 11/13/23 1936  Tue Nov 13, 2023  1655 CBC unremarkable, mild anemia within baseline range [MM]  1657 Vascular paged to discuss [MM]  1702 D/w Dr. Marea of vascular --recommends CTA.  If unremarkable, no indication for further management at this time and stable for outpatient follow-up with vascular surgery.  If evidence of occlusion, admit to hospitalist and he will evaluate in the morning for possible further intervention. [MM]  1855 CTA: IMPRESSION: 1. Patent overlapping stents from the proximal right common iliac through the external iliac and common femoral arteries. Nonocclusive intraluminal debris in the proximal to mid right SFA. Scattered plaque in the distal right SFA without high-grade stenosis. Patent 3-vessel tibial runoff. 2. Left common iliac atheromatous, mildly ectatic up to 1.7 cm in diameter. Anterior tibial artery occludes mid calf with contiguous posterior tibial and peroneal runoff.   [MM]  1855 Hospitalist consult order placed [MM]    Clinical Course User Index [MM] Clarine Ozell LABOR, MD     FINAL CLINICAL IMPRESSION(S) / ED DIAGNOSES   Final diagnoses:  Abnormal computed tomography angiography (CTA)  Peripheral arterial disease (HCC)     Rx / DC Orders   ED  Discharge Orders     None        Note:  This document was prepared using Dragon voice recognition software and may include unintentional dictation errors.   Clarine Ozell LABOR, MD 11/13/23 [phone]

## 2023-11-13 NOTE — H&P (Signed)
 History and Physical    Patient: Aaron Knox FMW:[id number] DOB: [birth year] DOA: 11/13/2023 DOS: the patient was seen and examined on 11/13/2023 PCP: Pcp, No  Patient coming from: Home  Chief Complaint:  Chief Complaint  Patient presents with   Tingling   Leg Pain    HPI: Aaron Knox is a 68 y.o. male with medical history significant for HTN, HLD, CAD s/p PCI to DES and LCA,PAD s/p multiple vascular procedures, most recently 09/2023 when he underwent repeat PCI due to rethrombosis of prior stent attributed to none compliance with anticoagulation, now being readmitted with acute limb ischemia after presenting with right leg pain 8 out of 10 and tingling, similar to prior presentations.  He reports compliance with his Eliquis  and Plavix ..  Separately there was not incidental finding of an esophageal lesion on CT on that hospitalization for which he was referred to gastroenterology.  He denies any GI complaints and denies chest pain or shortness of breath. In the ED, BP 173/74 with otherwise normal vitals.   He had palpable pulses on exam  Labs for the most part unremarkable and at baseline. CTA of the extremity showing patent stents  but with nonocclusive intraluminal debris and patent 3-vessel tibial runoff.  The ED provider spoke with vascular, Dr. Marea who advised no heparin  needed, continue Eliquis  and Plavix  will see in the a.m.  Hospitalist consulted for admission     Review of Systems: As mentioned in the history of present illness. All other systems reviewed and are negative.  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)    CAD in native artery    a.) LHC/PCI 08/09/2014 (setting of STEMI): 30% mLAD, 99% pLCx (3.0 x 15 mm Resolute Integrity DES x 1 with 10% residual post-intervention stenosis), 45% mLCx, 20% pRCA, 40% dRCA); b.) LHC 01/29/2018: 30% mLAD, 45% mLCx, 50% pRCA, 80% dRCA --> med mgmt   CHF (congestive  heart failure) (HCC)    DDD (degenerative disc disease), lumbar    Diastolic dysfunction    a.) TTE 08/11/2014 (s/p STEMI): EF 45-50%, inf HK, AoV sclerosis, G1DD; b.) TTE 02/28/2018: EF 55-60%, G1DD   History of bilateral cataract extraction 2023   HTN (hypertension)    Hyperlipidemia    Long term current use of antithrombotics/antiplatelets    a.) clopidogrel    ST elevation myocardial infarction (STEMI) of true posterior wall (HCC) 08/09/2014   a.) LHC/PCI 08/09/2014: 3.0 x 15 mm Resolute Integrity DES x 1 pLCx with 10% residual post-intervention stenosis)   Unstable angina [facility])    Past Surgical History:  Procedure Laterality Date   CARDIAC CATHETERIZATION N/A 08/09/2014   Procedure: Left Heart Cath and Coronary Angiography;  Surgeon: Deatrice DELENA Cage, MD;  Location: [facility] INVASIVE CV LAB;  Service: Cardiovascular;  Laterality: N/A;   CARDIAC CATHETERIZATION N/A 08/09/2014   Procedure: Coronary Stent Intervention;  Surgeon: Deatrice DELENA Cage, MD;  Location: [facility] INVASIVE CV LAB;  Service: Cardiovascular;  Laterality: N/A;   CATARACT EXTRACTION W/ INTRAOCULAR LENS  IMPLANT, BILATERAL Bilateral 2023   CORONARY ANGIOPLASTY  08/09/2014   ENDARTERECTOMY FEMORAL Right 09/20/2022   Procedure: ENDARTERECTOMY FEMORAL;  Surgeon: Marea Selinda RAMAN, MD;  Location: [facility] ORS;  Service: Vascular;  Laterality: Right;   HAND SURGERY Left 1980   bone chip with nerve damage   INSERTION OF ILIAC STENT Right 09/20/2022   Procedure: INSERTION OF ILIAC STENT;  Surgeon: Marea Selinda RAMAN, MD;  Location: [facility]  ORS;  Service: Vascular;  Laterality: Right;   LEFT HEART CATH AND CORONARY ANGIOGRAPHY N/A 01/29/2018   Procedure: LEFT HEART CATH AND CORONARY ANGIOGRAPHY;  Surgeon: Mady Bruckner, MD;  Location: [facility] INVASIVE CV LAB;  Service: Cardiovascular;  Laterality: N/A;   LOWER EXTREMITY ANGIOGRAPHY Right 08/07/2022   Procedure: Lower Extremity Angiography;  Surgeon: Marea Selinda RAMAN, MD;  Location: [facility] INVASIVE CV LAB;   Service: Cardiovascular;  Laterality: Right;   LOWER EXTREMITY ANGIOGRAPHY Right 01/12/2023   Procedure: Lower Extremity Angiography;  Surgeon: Marea Selinda RAMAN, MD;  Location: [facility] INVASIVE CV LAB;  Service: Cardiovascular;  Laterality: Right;   LOWER EXTREMITY ANGIOGRAPHY Right 10/01/2023   Procedure: Lower Extremity Angiography;  Surgeon: Marea Selinda RAMAN, MD;  Location: [facility] INVASIVE CV LAB;  Service: Cardiovascular;  Laterality: Right;   nerve reconstruction Left 1980   hand   Social History:  reports that he has been smoking cigarettes. He started smoking about 57 years ago. He has a 84.5 pack-year smoking history. He has never used smokeless tobacco. He reports current alcohol use of about 3.0 standard drinks of alcohol per week. He reports that he does not use drugs.  Allergies  Allergen Reactions   Aspirin  Anaphylaxis   Penicillins Other (See Comments)    Childhood allergy per pt's mother. He does not recall any details of the reaction. Willing to try beta lactams.    Family History  Problem Relation Age of Onset   CAD Maternal Grandfather     Prior to Admission medications   Medication Sig Start Date End Date Taking? Authorizing Provider  apixaban  (ELIQUIS ) 5 MG TABS tablet Take 1 tablet (5 mg total) by mouth 2 (two) times daily. 11/01/23   Brown, Fallon E, NP  atorvastatin  (LIPITOR ) 80 MG tablet Take 1 tablet (80 mg total) by mouth daily at 6 PM. 08/06/23   Furth, Cadence H, PA-C  clopidogrel  (PLAVIX ) 75 MG tablet Take 1 tablet (75 mg total) by mouth daily. 10/02/23 10/01/24  Amin, Sumayya, MD  ezetimibe  (ZETIA ) 10 MG tablet Take 1 tablet (10 mg total) by mouth daily. 10/09/23 01/07/24  Furth, Cadence H, PA-C  isosorbide  mononitrate (IMDUR ) 30 MG 24 hr tablet Take 1 tablet (30 mg total) by mouth 2 (two) times daily. 10/08/23   Furth, Cadence H, PA-C  metoprolol  succinate (TOPROL  XL) 25 MG 24 hr tablet Take 1 tablet (25 mg total) by mouth daily. 11/07/23   Cindie Ole DASEN, MD  nicotine   polacrilex (NICOTINE  MINI) 2 MG lozenge Take 1 lozenge (2 mg total) by mouth every 2 (two) hours as needed. 09/12/23 12/11/23  Assaker, Darrin, MD  pantoprazole  (PROTONIX ) 20 MG tablet Take 1 tablet (20 mg total) by mouth daily. 11/07/23   [place] Mikey DEL, PA-C    Physical Exam: Vitals:   11/13/23 1413 11/13/23 1414 11/13/23 1700 11/13/23 1710  BP: (!) 173/74  (!) 141/72   Pulse: 67  62 60  Resp: 18  (!) 22 17  Temp: 97.9 F (36.6 C)     TempSrc: Oral     SpO2: 97%  100% 98%  Weight:  88 kg    Height:  6' 2 (1.88 m)     Physical Exam Vitals and nursing note reviewed.  Constitutional:      General: He is not in acute distress. HENT:     Head: Normocephalic and atraumatic.  Cardiovascular:     Rate and Rhythm: Normal rate and regular rhythm.     Heart sounds: Normal heart sounds.  Pulmonary:     Effort: Pulmonary effort is normal.     Breath sounds: Normal breath sounds.  Abdominal:     Palpations: Abdomen is soft.     Tenderness: There is no abdominal tenderness.  Neurological:     Mental Status: Mental status is at baseline.     Labs on Admission: I have personally reviewed following labs and imaging studies  CBC: Recent Labs  Lab 11/13/23 1629  WBC 4.6  NEUTROABS 3.3  HGB 12.1*  HCT 35.6*  MCV 92.2  PLT 181   Basic Metabolic Panel: Recent Labs  Lab 11/13/23 1629  NA 139  K 3.5  CL 105  CO2 25  GLUCOSE 108*  BUN 9  CREATININE 0.95  CALCIUM  9.1   GFR: Estimated Creatinine Clearance: 84.1 mL/min (by C-G formula based on SCr of 0.95 mg/dL). Liver Function Tests: No results for input(s): AST, ALT, ALKPHOS, BILITOT, PROT, ALBUMIN in the last 168 hours. No results for input(s): LIPASE, AMYLASE in the last 168 hours. No results for input(s): AMMONIA in the last 168 hours. Coagulation Profile: Recent Labs  Lab 11/13/23 1629  INR 1.2   Cardiac Enzymes: No results for input(s): CKTOTAL, CKMB, CKMBINDEX, TROPONINI in the  last 168 hours. BNP (last 3 results) No results for input(s): PROBNP in the last 8760 hours. HbA1C: No results for input(s): HGBA1C in the last 72 hours. CBG: No results for input(s): GLUCAP in the last 168 hours. Lipid Profile: No results for input(s): CHOL, HDL, LDLCALC, TRIG, CHOLHDL, LDLDIRECT in the last 72 hours. Thyroid Function Tests: No results for input(s): TSH, T4TOTAL, FREET4, T3FREE, THYROIDAB in the last 72 hours. Anemia Panel: No results for input(s): VITAMINB12, FOLATE, FERRITIN, TIBC, IRON, RETICCTPCT in the last 72 hours. Urine analysis:    Component Value Date/Time   COLORURINE STRAW (A) 10/31/2022 2230   APPEARANCEUR CLEAR (A) 10/31/2022 2230   LABSPEC 1.015 10/31/2022 2230   PHURINE 6.0 10/31/2022 2230   GLUCOSEU NEGATIVE 10/31/2022 2230   HGBUR NEGATIVE 10/31/2022 2230   BILIRUBINUR NEGATIVE 10/31/2022 2230   KETONESUR NEGATIVE 10/31/2022 2230   PROTEINUR NEGATIVE 10/31/2022 2230   NITRITE NEGATIVE 10/31/2022 2230   LEUKOCYTESUR NEGATIVE 10/31/2022 2230    Radiological Exams on Admission: CT Angio Aortobifemoral W and/or Wo Contrast Result Date: 11/13/2023 EXAM: CTA ABDOMEN AND PELVIS WITH CONTRAST AND RUNOFF CTA OF THE LOWER EXTREMITIES WITH CONTRAST 11/13/2023 05:44:33 PM TECHNIQUE: CTA images of the abdomen, pelvis and lower extremities with intravenous contrast. Three-dimensional MIP/volume rendered formations were performed. Automated exposure control, iterative reconstruction, and/or weight based adjustment of the mA/kV was utilized to reduce the radiation dose to as low as reasonably achievable. COMPARISON: 09/29/2023 CLINICAL HISTORY: Right lower extremity discomfort, tingling. History of recent stent rethrombosis. Eval for occlusion. Pt c/o R leg pain and tingling. Pain score 8/10. Hx of same w/ multiple clots and stents. Pt reports it's same symptoms I had with the other blood clots. FINDINGS: VASCULATURE: AORTA:  Moderate partially calcified atheromatous plaque through the aorta. CELIAC TRUNK: Celiac axis atheromatous, patent. SUPERIOR MESENTERIC ARTERY: SMA mildly atheromatous, patent with classic distal branch anatomy. INFERIOR MESENTERIC ARTERY: IMA patent. RENAL ARTERIES: Duplicated bilateral renal arteries, mildly atheromatous but patent. RIGHT ILIAC ARTERIES: On the right, patent overlapping stents from the proximal common iliac through the external iliac and common femoral arteries. There is continued perfusion of the atheromatous internal iliac. RIGHT FEMORAL SRTERIES: There is nonocclusive intraluminal debris in the proximal in the mid SFA. Scattered plaque in the distal  superficial femoral artery without high-grade stenosis. RIGHT POPLITEAL ARTERY: Popliteal artery mildly atheromatous, patent. RIGHT CALF ARTERIES: Patent 3-vessel tibial runoff. LEFT ILIAC ARTERIES: Common iliac atheromatous, mildly ectatic up to 1.7 cm in diameter. Internal and external iliac arteries moderately atheromatous, patent. LEFT FEMORAL ARTERIES: Common femoral patent. SFA scattered calcified plaque through its length without high-grade stenosis. LEFT POPLITEAL ARTERY: Popliteal mildly atheromatous. LEFT CALF ARTERIES: Anterior tibial artery occludes mid calf. Contiguous posterior tibial and peroneal runoff. ABDOMEN AND PELVIS: LOWER CHEST: Visualized portion of the lower chest demonstrates no acute abnormality. LIVER: The liver is unremarkable. GALLBLADDER AND BILE DUCTS: Gallbladder is unremarkable. No biliary ductal dilatation. SPLEEN: The spleen is unremarkable. PANCREAS: The pancreas is unremarkable. ADRENAL GLANDS: Bilateral adrenal glands demonstrate no acute abnormality. KIDNEYS, URETERS AND BLADDER: No stones in the kidneys or ureters. No hydronephrosis. No evidence of perinephric or periureteral stranding. Urinary bladder is unremarkable. Bilateral renal cysts. GI AND Bowel: Stomach and duodenal sweep demonstrate no acute  abnormality. There is no bowel obstruction. No abnormal bowel wall thickening or distension. Normal appendix. REPRODUCTIVE: Reproductive organs are unremarkable. PERITONEUM AND RETRPERITONEUM: No ascites or free air. LYMPH NODES: No evidence of lymphadenopathy. BONES AND SOFT TISSUES: No acute abnormality of the bones. Postoperative changes overlying right common femoral vessels. IMPRESSION: 1. Patent overlapping stents from the proximal right common iliac through the external iliac and common femoral arteries. Nonocclusive intraluminal debris in the proximal to mid right SFA. Scattered plaque in the distal right SFA without high-grade stenosis. Patent 3-vessel tibial runoff. 2. Left common iliac atheromatous, mildly ectatic up to 1.7 cm in diameter. Anterior tibial artery occludes mid calf with contiguous posterior tibial and peroneal runoff. Electronically signed by: Dayne Hassell MD 11/13/2023 06:45 PM EDT RP Workstation: [id number]   Data Reviewed for HPI: Relevant notes from primary care and specialist visits, past discharge summaries as available in EHR, including Care Everywhere. Prior diagnostic testing as pertinent to current admission diagnoses Updated medications and problem lists for reconciliation ED course, including vitals, labs, imaging, treatment and response to treatment Triage notes, nursing and pharmacy notes and ED provider's notes Notable results as noted above in HPI      Assessment and Plan: * Atherosclerosis of native arteries of extremity with rest pain (HCC) History of stent placement left leg 09/2022 with restenosis 09/2023 History of medication noncompliance due to affordability CTA showing patent stents but with debris Vascular consult from ED recommending continuing Eliquis  and Plavix , no heparin  for now Patient is neurovascularly intact Continue Eliquis , Plavix , atorvastatin  and ezetimibe  Pain control Formal vascular consult  CAD S/P percutaneous coronary  angioplasty No complaints of chest pain Continue apixaban , atorvastatin , ezetimibe , Imdur  Metoprolol  was held in June due to borderline bradycardia-will continue to hold as heart rate around 60  Esophageal lesion Incidental finding on CT 09/2023 Continue with outpatient GI follow-up  Centrilobular emphysema (HCC) DuoNebs as needed     DVT prophylaxis: Eliquis   Consults: Vascular, Dr Marea  Advance Care Planning:   Code Status: Prior   Family Communication: none  Disposition Plan: Back to previous home environment  Severity of Illness: The appropriate patient status for this patient is OBSERVATION. Observation status is judged to be reasonable and necessary in order to provide the required intensity of service to ensure the patient's safety. The patient's presenting symptoms, physical exam findings, and initial radiographic and laboratory data in the context of their medical condition is felt to place them at decreased risk for further clinical deterioration. Furthermore, it is anticipated  that the patient will be medically stable for discharge from the hospital within 2 midnights of admission.   Author: Delayne LULLA Solian, MD 11/13/2023 7:37 PM  For on call review [email or web address].

## 2023-11-13 NOTE — ED Triage Notes (Signed)
 Pt c/o R leg pain and tingling.  Pain score 8/10.  Hx of same w/ multiple clots and stents.  Pt reports it's same symptoms I had with the other blood clots.

## 2023-11-14 ENCOUNTER — Other Ambulatory Visit: Payer: Self-pay

## 2023-11-14 DIAGNOSIS — Z95820 Peripheral vascular angioplasty status with implants and grafts: Secondary | ICD-10-CM | POA: Diagnosis not present

## 2023-11-14 DIAGNOSIS — I10 Essential (primary) hypertension: Secondary | ICD-10-CM

## 2023-11-14 DIAGNOSIS — I70221 Atherosclerosis of native arteries of extremities with rest pain, right leg: Secondary | ICD-10-CM

## 2023-11-14 DIAGNOSIS — Z7901 Long term (current) use of anticoagulants: Secondary | ICD-10-CM

## 2023-11-14 DIAGNOSIS — J432 Centrilobular emphysema: Secondary | ICD-10-CM

## 2023-11-14 DIAGNOSIS — K229 Disease of esophagus, unspecified: Secondary | ICD-10-CM

## 2023-11-14 DIAGNOSIS — Z9861 Coronary angioplasty status: Secondary | ICD-10-CM

## 2023-11-14 DIAGNOSIS — I2510 Atherosclerotic heart disease of native coronary artery without angina pectoris: Secondary | ICD-10-CM | POA: Diagnosis not present

## 2023-11-14 LAB — HIV ANTIBODY (ROUTINE TESTING W REFLEX): HIV Screen 4th Generation wRfx: NONREACTIVE

## 2023-11-14 NOTE — ED Notes (Signed)
Pt able to ambulate to restroom independently.

## 2023-11-14 NOTE — Care Management Obs Status (Signed)
 MEDICARE OBSERVATION STATUS NOTIFICATION   Patient Details  Name: JORDELL OUTTEN MRN: [id number] Date of Birth: May 05, 1955   Medicare Observation Status Notification Given:  Yes    Beila Purdie W, CMA 11/14/2023, 11:33 AM

## 2023-11-14 NOTE — Progress Notes (Signed)
 Patient arrived to room, provided with call bell, bed locked and low, educated on POC. Pt verbalizes understanding. NAD. VSS. Tele monitor placed on patient.

## 2023-11-14 NOTE — Hospital Course (Addendum)
 Taken from H&P.  Aaron Knox is a 68 y.o. male with medical history significant for HTN, HLD, CAD s/p PCI to DES and LCA,PAD s/p multiple vascular procedures, most recently 09/2023 when he underwent repeat PCI due to rethrombosis of prior stent attributed to none compliance with anticoagulation, now being readmitted with acute limb ischemia after presenting with right leg pain 8 out of 10 and tingling, similar to prior presentations.  He reports compliance with his Eliquis  and Plavix .  On presentation elevated blood pressure at 173/74, otherwise normal vital.  Had palpable pulses on exam and labs are mostly unremarkable. CT of the lower extremities showing patent stents but with nonocclusive intraluminal debris and patent three-vessel tibial runoff.  Vascular surgery was consulted.  8/6: Vitals normal with borderline bradycardia and blood pressure at 106/58. Vascular is planning angiography tomorrow morning.

## 2023-11-14 NOTE — Consult Note (Signed)
 Hospital Consult    Reason for Consult:  Right Lower extremity Ischemia Requesting Physician:  Dr Delayne Solian MD  MRN #:  [id number]  History of Present Illness: This is a 68 y.o. male with medical history significant for HTN, HLD, CAD s/p PCI to DES and LCA,PAD s/p multiple vascular procedures, most recently 09/2023 when he underwent repeat PCI due to rethrombosis of prior stent attributed to none compliance with anticoagulation, now being readmitted with acute limb ischemia after presenting with right leg pain 8 out of 10 and tingling, similar to prior presentations.  He reports compliance with his Eliquis  and Plavix .  Upon workup patient underwent CTA of the abdomen pelvis with runoffs to bilateral lower extremities.  This reveals patent stents that were placed prior but he continues to have nonocclusive intraluminal debris with patent three-vessel runoff to his right lower extremity.  He is also known to have heavy calcification to his left lower extremity.  Vascular surgery was consulted to evaluate.  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)    CAD in native artery    a.) LHC/PCI 08/09/2014 (setting of STEMI): 30% mLAD, 99% pLCx (3.0 x 15 mm Resolute Integrity DES x 1 with 10% residual post-intervention stenosis), 45% mLCx, 20% pRCA, 40% dRCA); b.) LHC 01/29/2018: 30% mLAD, 45% mLCx, 50% pRCA, 80% dRCA --> med mgmt   CHF (congestive heart failure) (HCC)    DDD (degenerative disc disease), lumbar    Diastolic dysfunction    a.) TTE 08/11/2014 (s/p STEMI): EF 45-50%, inf HK, AoV sclerosis, G1DD; b.) TTE 02/28/2018: EF 55-60%, G1DD   History of bilateral cataract extraction 2023   HTN (hypertension)    Hyperlipidemia    Long term current use of antithrombotics/antiplatelets    a.) clopidogrel    ST elevation myocardial infarction (STEMI) of true posterior wall (HCC) 08/09/2014   a.) LHC/PCI 08/09/2014: 3.0 x 15 mm Resolute  Integrity DES x 1 pLCx with 10% residual post-intervention stenosis)   Unstable angina [facility])     Past Surgical History:  Procedure Laterality Date   CARDIAC CATHETERIZATION N/A 08/09/2014   Procedure: Left Heart Cath and Coronary Angiography;  Surgeon: Deatrice DELENA Cage, MD;  Location: [facility];  Service: Cardiovascular;  Laterality: N/A;   CARDIAC CATHETERIZATION N/A 08/09/2014   Procedure: Coronary Stent Intervention;  Surgeon: Deatrice DELENA Cage, MD;  Location: [facility];  Service: Cardiovascular;  Laterality: N/A;   CATARACT EXTRACTION W/ INTRAOCULAR LENS  IMPLANT, BILATERAL Bilateral 2023   CORONARY ANGIOPLASTY  08/09/2014   ENDARTERECTOMY FEMORAL Right 09/20/2022   Procedure: ENDARTERECTOMY FEMORAL;  Surgeon: Marea Selinda RAMAN, MD;  Location: [facility] ORS;  Service: Vascular;  Laterality: Right;   HAND SURGERY Left 1980   bone chip with nerve damage   INSERTION OF ILIAC STENT Right 09/20/2022   Procedure: INSERTION OF ILIAC STENT;  Surgeon: Marea Selinda RAMAN, MD;  Location: [facility] ORS;  Service: Vascular;  Laterality: Right;   LEFT HEART CATH AND CORONARY ANGIOGRAPHY N/A 01/29/2018   Procedure: LEFT HEART CATH AND CORONARY ANGIOGRAPHY;  Surgeon: Mady Bruckner, MD;  Location: [facility];  Service: Cardiovascular;  Laterality: N/A;   LOWER EXTREMITY ANGIOGRAPHY Right 08/07/2022   Procedure: Lower Extremity Angiography;  Surgeon: Marea Selinda RAMAN, MD;  Location: [facility];  Service: Cardiovascular;  Laterality: Right;   LOWER EXTREMITY ANGIOGRAPHY Right 01/12/2023   Procedure: Lower Extremity Angiography;  Surgeon: Marea Selinda RAMAN, MD;  Location: [facility];  Service: Cardiovascular;  Laterality: Right;   LOWER EXTREMITY ANGIOGRAPHY Right 10/01/2023   Procedure: Lower Extremity Angiography;  Surgeon: Marea Selinda RAMAN, MD;  Location: [facility];  Service: Cardiovascular;  Laterality: Right;   nerve reconstruction Left 1980   hand    Allergies  Allergen  Reactions   Aspirin  Anaphylaxis   Penicillins Other (See Comments)    Childhood allergy per pt's mother. He does not recall any details of the reaction. Willing to try beta lactams.    Prior to Admission medications   Medication Sig Start Date End Date Taking? Authorizing Provider  apixaban  (ELIQUIS ) 5 MG TABS tablet Take 1 tablet (5 mg total) by mouth 2 (two) times daily. 11/01/23  Yes Brown, Fallon E, NP  atorvastatin  (LIPITOR ) 80 MG tablet Take 1 tablet (80 mg total) by mouth daily at 6 PM. 08/06/23  Yes Furth, Cadence H, PA-C  clopidogrel  (PLAVIX ) 75 MG tablet Take 1 tablet (75 mg total) by mouth daily. 10/02/23 10/01/24 Yes Caleen Qualia, MD  ezetimibe  (ZETIA ) 10 MG tablet Take 1 tablet (10 mg total) by mouth daily. 10/09/23 01/07/24 Yes Furth, Cadence H, PA-C  isosorbide  mononitrate (IMDUR ) 30 MG 24 hr tablet Take 1 tablet (30 mg total) by mouth 2 (two) times daily. 10/08/23  Yes Furth, Cadence H, PA-C  metoprolol  succinate (TOPROL  XL) 25 MG 24 hr tablet Take 1 tablet (25 mg total) by mouth daily. 11/07/23  Yes Cindie Ole DASEN, MD  nicotine  polacrilex (NICOTINE  MINI) 2 MG lozenge Take 1 lozenge (2 mg total) by mouth every 2 (two) hours as needed. 09/12/23 12/11/23 Yes Assaker, Darrin, MD  pantoprazole  (PROTONIX ) 20 MG tablet Take 1 tablet (20 mg total) by mouth daily. 11/07/23  Yes Furth, Cadence H, PA-C    Social History   Socioeconomic History   Marital status: Single    Spouse name: Not on file   Number of children: 1   Years of education: Not on file   Highest education level: Not on file  Occupational History   Not on file  Tobacco Use   Smoking status: Some Days    Current packs/day: 0.00    Average packs/day: 1.5 packs/day for 56.3 years (84.5 ttl pk-yrs)    Types: Cigarettes    Start date: 1968    Last attempt to quit: 08/2022    Years since quitting: 1.2   Smokeless tobacco: Never   Tobacco comments:    Smoked 1.5 ppd at his heaviest 09/12/23  Vaping Use   Vaping  status: Every Day   Substances: Nicotine   Substance and Sexual Activity   Alcohol use: Yes    Alcohol/week: 3.0 standard drinks of alcohol    Types: 3 Glasses of wine per week    Comment: weekends   Drug use: No   Sexual activity: Not on file  Other Topics Concern   Not on file  Social History Narrative   Has fiance   Social Drivers of Health   Financial Resource Strain: Not on file  Food Insecurity: No Food Insecurity (09/30/2023)   Hunger Vital Sign    Worried About Running Out of Food in the Last Year: Never true    Ran Out of Food in the Last Year: Never true  Transportation Needs: No Transportation Needs (09/30/2023)   PRAPARE - Administrator, Civil Service (Medical): No    Lack of Transportation (Non-Medical): No  Physical Activity: Not on file  Stress: Not  on file  Social Connections: Unknown (09/30/2023)   Social Connection and Isolation Panel    Frequency of Communication with Friends and Family: More than three times a week    Frequency of Social Gatherings with Friends and Family: Not on file    Attends Religious Services: Patient declined    Active Member of Clubs or Organizations: Patient declined    Attends Banker Meetings: Patient declined    Marital Status: Living with partner  Intimate Partner Violence: Unknown (09/30/2023)   Humiliation, Afraid, Rape, and Kick questionnaire    Fear of Current or Ex-Partner: No    Emotionally Abused: No    Physically Abused: Not on file    Sexually Abused: No     Family History  Problem Relation Age of Onset   CAD Maternal Grandfather     ROS: Otherwise negative unless mentioned in HPI  Physical Examination  Vitals:   11/14/23 0600 11/14/23 0700  BP: (!) 108/56 (!) 123/57  Pulse: (!) 59 (!) 47  Resp: 17 16  Temp:    SpO2: 94% 97%   Body mass index is 24.91 kg/m.  General:  WDWN in NAD Gait: Not observed HENT: WNL, normocephalic Pulmonary: normal non-labored breathing, without  Rales, rhonchi,  wheezing Cardiac: regular, without  Murmurs, rubs or gallops; without carotid bruits Abdomen: Positive bowel sounds,  soft, NT/ND, no masses Skin: without rashes Vascular Exam/Pulses: +2+ swelling to patient's right lower extremity.  Unable to palpate DP/PT pulses.  Left lower extremity is normal without swelling with positive weak DP and PT pulses.  Both extremities are warm to touch. Extremities: with ischemic changes, without Gangrene , without cellulitis; without open wounds;  Musculoskeletal: no muscle wasting or atrophy  Neurologic: A&O X 3;  No focal weakness or paresthesias are detected; speech is fluent/normal Psychiatric:  The pt has Normal affect. Lymph:  Unremarkable  CBC    Component Value Date/Time   WBC 4.6 11/13/2023 1629   RBC 3.86 (L) 11/13/2023 1629   HGB 12.1 (L) 11/13/2023 1629   HGB 13.6 10/08/2023 0849   HCT 35.6 (L) 11/13/2023 1629   HCT 40.4 10/08/2023 0849   PLT 181 11/13/2023 1629   PLT 205 10/08/2023 0849   MCV 92.2 11/13/2023 1629   MCV 94 10/08/2023 0849   MCH 31.3 11/13/2023 1629   MCHC 34.0 11/13/2023 1629   RDW 12.9 11/13/2023 1629   RDW 12.7 10/08/2023 0849   LYMPHSABS 0.8 11/13/2023 1629   MONOABS 0.4 11/13/2023 1629   EOSABS 0.1 11/13/2023 1629   BASOSABS 0.0 11/13/2023 1629    BMET    Component Value Date/Time   NA 139 11/13/2023 1629   K 3.5 11/13/2023 1629   CL 105 11/13/2023 1629   CO2 25 11/13/2023 1629   GLUCOSE 108 (H) 11/13/2023 1629   BUN 9 11/13/2023 1629   CREATININE 0.95 11/13/2023 1629   CALCIUM  9.1 11/13/2023 1629   GFRNONAA >60 11/13/2023 1629   GFRAA >60 01/30/2018 0408    COAGS: Lab Results  Component Value Date   INR 1.2 11/13/2023   INR 1.1 09/29/2023   INR 1.2 01/12/2023     Non-Invasive Vascular Imaging:   EXAM:11/14/23 CTA ABDOMEN AND PELVIS WITH CONTRAST AND RUNOFF CTA OF THE LOWER EXTREMITIES WITH CONTRAST 11/13/2023 05:44:33 PM   TECHNIQUE: CTA images of the abdomen, pelvis and  lower extremities with intravenous contrast. Three-dimensional MIP/volume rendered formations were performed. Automated exposure control, iterative reconstruction, and/or weight based adjustment of the mA/kV was  utilized to reduce the radiation dose to as low as reasonably achievable.   COMPARISON: 09/29/2023   CLINICAL HISTORY: Right lower extremity discomfort, tingling. History of recent stent rethrombosis. Eval for occlusion. Pt c/o R leg pain and tingling. Pain score 8/10. Hx of same w/ multiple clots and stents. Pt reports it's same symptoms I had with the other blood clots.   FINDINGS:   VASCULATURE:   AORTA: Moderate partially calcified atheromatous plaque through the aorta.   CELIAC TRUNK: Celiac axis atheromatous, patent.   SUPERIOR MESENTERIC ARTERY: SMA mildly atheromatous, patent with classic distal branch anatomy.   INFERIOR MESENTERIC ARTERY: IMA patent.   RENAL ARTERIES: Duplicated bilateral renal arteries, mildly atheromatous but patent.   RIGHT ILIAC ARTERIES: On the right, patent overlapping stents from the proximal common iliac through the external iliac and common femoral arteries. There is continued perfusion of the atheromatous internal iliac.   RIGHT FEMORAL SRTERIES: There is nonocclusive intraluminal debris in the proximal in the mid SFA. Scattered plaque in the distal superficial femoral artery without high-grade stenosis.   RIGHT POPLITEAL ARTERY: Popliteal artery mildly atheromatous, patent.   RIGHT CALF ARTERIES: Patent 3-vessel tibial runoff.   LEFT ILIAC ARTERIES: Common iliac atheromatous, mildly ectatic up to 1.7 cm in diameter. Internal and external iliac arteries moderately atheromatous, patent.   LEFT FEMORAL ARTERIES: Common femoral patent. SFA scattered calcified plaque through its length without high-grade stenosis.   LEFT POPLITEAL ARTERY: Popliteal mildly atheromatous.   LEFT CALF ARTERIES: Anterior tibial artery  occludes mid calf. Contiguous posterior tibial and peroneal runoff.   ABDOMEN AND PELVIS:   LOWER CHEST: Visualized portion of the lower chest demonstrates no acute abnormality.   LIVER: The liver is unremarkable.   GALLBLADDER AND BILE DUCTS: Gallbladder is unremarkable. No biliary ductal dilatation.   SPLEEN: The spleen is unremarkable.   PANCREAS: The pancreas is unremarkable.   ADRENAL GLANDS: Bilateral adrenal glands demonstrate no acute abnormality.   KIDNEYS, URETERS AND BLADDER: No stones in the kidneys or ureters. No hydronephrosis. No evidence of perinephric or periureteral stranding. Urinary bladder is unremarkable. Bilateral renal cysts.   GI AND Bowel: Stomach and duodenal sweep demonstrate no acute abnormality. There is no bowel obstruction. No abnormal bowel wall thickening or distension. Normal appendix.   REPRODUCTIVE: Reproductive organs are unremarkable.   PERITONEUM AND RETRPERITONEUM: No ascites or free air.   LYMPH NODES: No evidence of lymphadenopathy.   BONES AND SOFT TISSUES: No acute abnormality of the bones. Postoperative changes overlying right common femoral vessels.   IMPRESSION: 1. Patent overlapping stents from the proximal right common iliac through the external iliac and common femoral arteries. Nonocclusive intraluminal debris in the proximal to mid right SFA. Scattered plaque in the distal right SFA without high-grade stenosis. Patent 3-vessel tibial runoff. 2. Left common iliac atheromatous, mildly ectatic up to 1.7 cm in diameter. Anterior tibial artery occludes mid calf with contiguous posterior tibial and peroneal runoff.  Statin:  Yes.   Beta Blocker:  Yes.   Aspirin :  No. ACEI:  No. ARB:  No. CCB use:  No Other antiplatelets/anticoagulants:  Yes.   Eliquis  5 mg BID    ASSESSMENT/PLAN: This is a 68 y.o. male who presents to [facility] emergency department with right lower extremity pain which she classifies as 8 out  of 10 with tingling similar to prior presentations.  Patient back in June 2025 underwent PCI for rethrombosis of prior stents that were placed to his right lower extremity due to limb ischemia.  At that time he was noncompliant with Eliquis  and Plavix .  Today the patient endorses that he has been very compliant with his Eliquis  and Plavix  as we had spoken about previously.  However the patient does continue to smoke at least a pack a day of cigarettes.  Vascular surgery plans on taking the patient to the vascular lab tomorrow on 11/15/2023 for right lower extremity angiogram with possible intervention.  Patient verbalizes understanding wishes to proceed.  Answered all his questions this morning.  Patient will be made n.p.o. at midnight tonight for procedure tomorrow.   -I discussed the case in detail with Dr. Selinda Gu MD and he agrees with plan.   Gwendlyn JONELLE [facility]ank [facility] 11/14/2023 7:43 AM

## 2023-11-14 NOTE — ED Notes (Signed)
 Pt back in bed. Reconnected to monitor. Bed in lowest position with wheels locked. Side rails up. Denies any needs at this time. Call light within reach.

## 2023-11-14 NOTE — H&P (View-Only) (Signed)
 Hospital Consult    Reason for Consult:  Right Lower extremity Ischemia Requesting Physician:  Dr Delayne Solian MD  MRN #:  [id number]  History of Present Illness: This is a 68 y.o. male with medical history significant for HTN, HLD, CAD s/p PCI to DES and LCA,PAD s/p multiple vascular procedures, most recently 09/2023 when he underwent repeat PCI due to rethrombosis of prior stent attributed to none compliance with anticoagulation, now being readmitted with acute limb ischemia after presenting with right leg pain 8 out of 10 and tingling, similar to prior presentations.  He reports compliance with his Eliquis  and Plavix .  Upon workup patient underwent CTA of the abdomen pelvis with runoffs to bilateral lower extremities.  This reveals patent stents that were placed prior but he continues to have nonocclusive intraluminal debris with patent three-vessel runoff to his right lower extremity.  He is also known to have heavy calcification to his left lower extremity.  Vascular surgery was consulted to evaluate.  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)    CAD in native artery    a.) LHC/PCI 08/09/2014 (setting of STEMI): 30% mLAD, 99% pLCx (3.0 x 15 mm Resolute Integrity DES x 1 with 10% residual post-intervention stenosis), 45% mLCx, 20% pRCA, 40% dRCA); b.) LHC 01/29/2018: 30% mLAD, 45% mLCx, 50% pRCA, 80% dRCA --> med mgmt   CHF (congestive heart failure) (HCC)    DDD (degenerative disc disease), lumbar    Diastolic dysfunction    a.) TTE 08/11/2014 (s/p STEMI): EF 45-50%, inf HK, AoV sclerosis, G1DD; b.) TTE 02/28/2018: EF 55-60%, G1DD   History of bilateral cataract extraction 2023   HTN (hypertension)    Hyperlipidemia    Long term current use of antithrombotics/antiplatelets    a.) clopidogrel    ST elevation myocardial infarction (STEMI) of true posterior wall (HCC) 08/09/2014   a.) LHC/PCI 08/09/2014: 3.0 x 15 mm Resolute  Integrity DES x 1 pLCx with 10% residual post-intervention stenosis)   Unstable angina [facility])     Past Surgical History:  Procedure Laterality Date   CARDIAC CATHETERIZATION N/A 08/09/2014   Procedure: Left Heart Cath and Coronary Angiography;  Surgeon: Deatrice DELENA Cage, MD;  Location: [facility];  Service: Cardiovascular;  Laterality: N/A;   CARDIAC CATHETERIZATION N/A 08/09/2014   Procedure: Coronary Stent Intervention;  Surgeon: Deatrice DELENA Cage, MD;  Location: [facility];  Service: Cardiovascular;  Laterality: N/A;   CATARACT EXTRACTION W/ INTRAOCULAR LENS  IMPLANT, BILATERAL Bilateral 2023   CORONARY ANGIOPLASTY  08/09/2014   ENDARTERECTOMY FEMORAL Right 09/20/2022   Procedure: ENDARTERECTOMY FEMORAL;  Surgeon: Marea Selinda RAMAN, MD;  Location: [facility] ORS;  Service: Vascular;  Laterality: Right;   HAND SURGERY Left 1980   bone chip with nerve damage   INSERTION OF ILIAC STENT Right 09/20/2022   Procedure: INSERTION OF ILIAC STENT;  Surgeon: Marea Selinda RAMAN, MD;  Location: [facility] ORS;  Service: Vascular;  Laterality: Right;   LEFT HEART CATH AND CORONARY ANGIOGRAPHY N/A 01/29/2018   Procedure: LEFT HEART CATH AND CORONARY ANGIOGRAPHY;  Surgeon: Mady Bruckner, MD;  Location: [facility] IN[facility] CV [facility];  Service: Cardiovascular;  Laterality: N/A;   LOWER EXTREMITY ANGIOGRAPHY Right 08/07/2022   Procedure: Lower Extremity Angiography;  Surgeon: Marea Selinda RAMAN, MD;  Location: [facility];  Service: Cardiovascular;  Laterality: Right;   LOWER EXTREMITY ANGIOGRAPHY Right 01/12/2023   Procedure: Lower Extremity Angiography;  Surgeon: Marea Selinda RAMAN, MD;  Location: [facility] CV [facility];  Service: Cardiovascular;  Laterality: Right;   LOWER EXTREMITY ANGIOGRAPHY Right 10/01/2023   Procedure: Lower Extremity Angiography;  Surgeon: Marea Selinda RAMAN, MD;  Location: [facility] INV[facility] CV [facility];  Service: Cardiovascular;  Laterality: Right;   nerve reconstruction Left 1980   hand    Allergies  Allergen  Reactions   Aspirin  Anaphylaxis   Penicillins Other (See Comments)    Childhood allergy per pt's mother. He does not recall any details of the reaction. Willing to try beta lactams.    Prior to Admission medications   Medication Sig Start Date End Date Taking? Authorizing Provider  apixaban  (ELIQUIS ) 5 MG TABS tablet Take 1 tablet (5 mg total) by mouth 2 (two) times daily. 11/01/23  Yes Brown, Fallon E, NP  atorvastatin  (LIPITOR ) 80 MG tablet Take 1 tablet (80 mg total) by mouth daily at 6 PM. 08/06/23  Yes Furth, Cadence H, PA-C  clopidogrel  (PLAVIX ) 75 MG tablet Take 1 tablet (75 mg total) by mouth daily. 10/02/23 10/01/24 Yes Caleen Qualia, MD  ezetimibe  (ZETIA ) 10 MG tablet Take 1 tablet (10 mg total) by mouth daily. 10/09/23 01/07/24 Yes Furth, Cadence H, PA-C  isosorbide  mononitrate (IMDUR ) 30 MG 24 hr tablet Take 1 tablet (30 mg total) by mouth 2 (two) times daily. 10/08/23  Yes Furth, Cadence H, PA-C  metoprolol  succinate (TOPROL  XL) 25 MG 24 hr tablet Take 1 tablet (25 mg total) by mouth daily. 11/07/23  Yes Cindie Ole DASEN, MD  nicotine  polacrilex (NICOTINE  MINI) 2 MG lozenge Take 1 lozenge (2 mg total) by mouth every 2 (two) hours as needed. 09/12/23 12/11/23 Yes Assaker, Darrin, MD  pantoprazole  (PROTONIX ) 20 MG tablet Take 1 tablet (20 mg total) by mouth daily. 11/07/23  Yes Furth, Cadence H, PA-C    Social History   Socioeconomic History   Marital status: Single    Spouse name: Not on file   Number of children: 1   Years of education: Not on file   Highest education level: Not on file  Occupational History   Not on file  Tobacco Use   Smoking status: Some Days    Current packs/day: 0.00    Average packs/day: 1.5 packs/day for 56.3 years (84.5 ttl pk-yrs)    Types: Cigarettes    Start date: 1968    Last attempt to quit: 08/2022    Years since quitting: 1.2   Smokeless tobacco: Never   Tobacco comments:    Smoked 1.5 ppd at his heaviest 09/12/23  Vaping Use   Vaping  status: Every Day   Substances: Nicotine   Substance and Sexual Activity   Alcohol use: Yes    Alcohol/week: 3.0 standard drinks of alcohol    Types: 3 Glasses of wine per week    Comment: weekends   Drug use: No   Sexual activity: Not on file  Other Topics Concern   Not on file  Social History Narrative   Has fiance   Social Drivers of Health   Financial Resource Strain: Not on file  Food Insecurity: No Food Insecurity (09/30/2023)   Hunger Vital Sign    Worried About Running Out of Food in the Last Year: Never true    Ran Out of Food in the Last Year: Never true  Transportation Needs: No Transportation Needs (09/30/2023)   PRAPARE - Administrator, Civil Service (Medical): No    Lack of Transportation (Non-Medical): No  Physical Activity: Not on file  Stress: Not  on file  Social Connections: Unknown (09/30/2023)   Social Connection and Isolation Panel    Frequency of Communication with Friends and Family: More than three times a week    Frequency of Social Gatherings with Friends and Family: Not on file    Attends Religious Services: Patient declined    Active Member of Clubs or Organizations: Patient declined    Attends Banker Meetings: Patient declined    Marital Status: Living with partner  Intimate Partner Violence: Unknown (09/30/2023)   Humiliation, Afraid, Rape, and Kick questionnaire    Fear of Current or Ex-Partner: No    Emotionally Abused: No    Physically Abused: Not on file    Sexually Abused: No     Family History  Problem Relation Age of Onset   CAD Maternal Grandfather     ROS: Otherwise negative unless mentioned in HPI  Physical Examination  Vitals:   11/14/23 0600 11/14/23 0700  BP: (!) 108/56 (!) 123/57  Pulse: (!) 59 (!) 47  Resp: 17 16  Temp:    SpO2: 94% 97%   Body mass index is 24.91 kg/m.  General:  WDWN in NAD Gait: Not observed HENT: WNL, normocephalic Pulmonary: normal non-labored breathing, without  Rales, rhonchi,  wheezing Cardiac: regular, without  Murmurs, rubs or gallops; without carotid bruits Abdomen: Positive bowel sounds,  soft, NT/ND, no masses Skin: without rashes Vascular Exam/Pulses: +2+ swelling to patient's right lower extremity.  Unable to palpate DP/PT pulses.  Left lower extremity is normal without swelling with positive weak DP and PT pulses.  Both extremities are warm to touch. Extremities: with ischemic changes, without Gangrene , without cellulitis; without open wounds;  Musculoskeletal: no muscle wasting or atrophy  Neurologic: A&O X 3;  No focal weakness or paresthesias are detected; speech is fluent/normal Psychiatric:  The pt has Normal affect. Lymph:  Unremarkable  CBC    Component Value Date/Time   WBC 4.6 11/13/2023 1629   RBC 3.86 (L) 11/13/2023 1629   HGB 12.1 (L) 11/13/2023 1629   HGB 13.6 10/08/2023 0849   HCT 35.6 (L) 11/13/2023 1629   HCT 40.4 10/08/2023 0849   PLT 181 11/13/2023 1629   PLT 205 10/08/2023 0849   MCV 92.2 11/13/2023 1629   MCV 94 10/08/2023 0849   MCH 31.3 11/13/2023 1629   MCHC 34.0 11/13/2023 1629   RDW 12.9 11/13/2023 1629   RDW 12.7 10/08/2023 0849   LYMPHSABS 0.8 11/13/2023 1629   MONOABS 0.4 11/13/2023 1629   EOSABS 0.1 11/13/2023 1629   BASOSABS 0.0 11/13/2023 1629    BMET    Component Value Date/Time   NA 139 11/13/2023 1629   K 3.5 11/13/2023 1629   CL 105 11/13/2023 1629   CO2 25 11/13/2023 1629   GLUCOSE 108 (H) 11/13/2023 1629   BUN 9 11/13/2023 1629   CREATININE 0.95 11/13/2023 1629   CALCIUM  9.1 11/13/2023 1629   GFRNONAA >60 11/13/2023 1629   GFRAA >60 01/30/2018 0408    COAGS: Lab Results  Component Value Date   INR 1.2 11/13/2023   INR 1.1 09/29/2023   INR 1.2 01/12/2023     Non-Invasive Vascular Imaging:   EXAM:11/14/23 CTA ABDOMEN AND PELVIS WITH CONTRAST AND RUNOFF CTA OF THE LOWER EXTREMITIES WITH CONTRAST 11/13/2023 05:44:33 PM   TECHNIQUE: CTA images of the abdomen, pelvis and  lower extremities with intravenous contrast. Three-dimensional MIP/volume rendered formations were performed. Automated exposure control, iterative reconstruction, and/or weight based adjustment of the mA/kV was  utilized to reduce the radiation dose to as low as reasonably achievable.   COMPARISON: 09/29/2023   CLINICAL HISTORY: Right lower extremity discomfort, tingling. History of recent stent rethrombosis. Eval for occlusion. Pt c/o R leg pain and tingling. Pain score 8/10. Hx of same w/ multiple clots and stents. Pt reports it's same symptoms I had with the other blood clots.   FINDINGS:   VASCULATURE:   AORTA: Moderate partially calcified atheromatous plaque through the aorta.   CELIAC TRUNK: Celiac axis atheromatous, patent.   SUPERIOR MESENTERIC ARTERY: SMA mildly atheromatous, patent with classic distal branch anatomy.   INFERIOR MESENTERIC ARTERY: IMA patent.   RENAL ARTERIES: Duplicated bilateral renal arteries, mildly atheromatous but patent.   RIGHT ILIAC ARTERIES: On the right, patent overlapping stents from the proximal common iliac through the external iliac and common femoral arteries. There is continued perfusion of the atheromatous internal iliac.   RIGHT FEMORAL SRTERIES: There is nonocclusive intraluminal debris in the proximal in the mid SFA. Scattered plaque in the distal superficial femoral artery without high-grade stenosis.   RIGHT POPLITEAL ARTERY: Popliteal artery mildly atheromatous, patent.   RIGHT CALF ARTERIES: Patent 3-vessel tibial runoff.   LEFT ILIAC ARTERIES: Common iliac atheromatous, mildly ectatic up to 1.7 cm in diameter. Internal and external iliac arteries moderately atheromatous, patent.   LEFT FEMORAL ARTERIES: Common femoral patent. SFA scattered calcified plaque through its length without high-grade stenosis.   LEFT POPLITEAL ARTERY: Popliteal mildly atheromatous.   LEFT CALF ARTERIES: Anterior tibial artery  occludes mid calf. Contiguous posterior tibial and peroneal runoff.   ABDOMEN AND PELVIS:   LOWER CHEST: Visualized portion of the lower chest demonstrates no acute abnormality.   LIVER: The liver is unremarkable.   GALLBLADDER AND BILE DUCTS: Gallbladder is unremarkable. No biliary ductal dilatation.   SPLEEN: The spleen is unremarkable.   PANCREAS: The pancreas is unremarkable.   ADRENAL GLANDS: Bilateral adrenal glands demonstrate no acute abnormality.   KIDNEYS, URETERS AND BLADDER: No stones in the kidneys or ureters. No hydronephrosis. No evidence of perinephric or periureteral stranding. Urinary bladder is unremarkable. Bilateral renal cysts.   GI AND Bowel: Stomach and duodenal sweep demonstrate no acute abnormality. There is no bowel obstruction. No abnormal bowel wall thickening or distension. Normal appendix.   REPRODUCTIVE: Reproductive organs are unremarkable.   PERITONEUM AND RETRPERITONEUM: No ascites or free air.   LYMPH NODES: No evidence of lymphadenopathy.   BONES AND SOFT TISSUES: No acute abnormality of the bones. Postoperative changes overlying right common femoral vessels.   IMPRESSION: 1. Patent overlapping stents from the proximal right common iliac through the external iliac and common femoral arteries. Nonocclusive intraluminal debris in the proximal to mid right SFA. Scattered plaque in the distal right SFA without high-grade stenosis. Patent 3-vessel tibial runoff. 2. Left common iliac atheromatous, mildly ectatic up to 1.7 cm in diameter. Anterior tibial artery occludes mid calf with contiguous posterior tibial and peroneal runoff.  Statin:  Yes.   Beta Blocker:  Yes.   Aspirin :  No. ACEI:  No. ARB:  No. CCB use:  No Other antiplatelets/anticoagulants:  Yes.   Eliquis  5 mg BID    ASSESSMENT/PLAN: This is a 68 y.o. male who presents to [facility] emergency department with right lower extremity pain which she classifies as 8 out  of 10 with tingling similar to prior presentations.  Patient back in June 2025 underwent PCI for rethrombosis of prior stents that were placed to his right lower extremity due to limb ischemia.  At that time he was noncompliant with Eliquis  and Plavix .  Today the patient endorses that he has been very compliant with his Eliquis  and Plavix  as we had spoken about previously.  However the patient does continue to smoke at least a pack a day of cigarettes.  Vascular surgery plans on taking the patient to the vascular lab tomorrow on 11/15/2023 for right lower extremity angiogram with possible intervention.  Patient verbalizes understanding wishes to proceed.  Answered all his questions this morning.  Patient will be made n.p.o. at midnight tonight for procedure tomorrow.   -I discussed the case in detail with Dr. Selinda Gu MD and he agrees with plan.   Gwendlyn JONELLE Shank [facility] 11/14/2023 7:43 AM

## 2023-11-14 NOTE — Progress Notes (Signed)
  Progress Note   Patient: Aaron Knox Heppler FMW:[id number] DOB: May 15, 1955 DOA: 11/13/2023     0 DOS: the patient was seen and examined on 11/14/2023   Brief hospital course: Taken from H&P.  Custer Pimenta Yeary is a 68 y.o. male with medical history significant for HTN, HLD, CAD s/p PCI to DES and LCA,PAD s/p multiple vascular procedures, most recently 09/2023 when he underwent repeat PCI due to rethrombosis of prior stent attributed to none compliance with anticoagulation, now being readmitted with acute limb ischemia after presenting with right leg pain 8 out of 10 and tingling, similar to prior presentations.  He reports compliance with his Eliquis  and Plavix .  On presentation elevated blood pressure at 173/74, otherwise normal vital.  Had palpable pulses on exam and labs are mostly unremarkable. CT of the lower extremities showing patent stents but with nonocclusive intraluminal debris and patent three-vessel tibial runoff.  Vascular surgery was consulted.  8/6: Vitals normal with borderline bradycardia and blood pressure at 106/58. Vascular is planning angiography tomorrow morning.  Assessment and Plan: * Atherosclerosis of native arteries of extremity with rest pain (HCC) History of stent placement left leg 09/2022 with restenosis 09/2023 History of medication noncompliance due to affordability CTA showing patent stents but with debris Vascular consult from ED recommending continuing Eliquis  and Plavix , no heparin  for now Patient is neurovascularly intact Continue Eliquis , Plavix , atorvastatin  and ezetimibe  Pain control Vascular surgery is planning to take him to vascular lab tomorrow morning  CAD S/P percutaneous coronary angioplasty No complaints of chest pain Continue apixaban , atorvastatin , ezetimibe , Imdur  Metoprolol  was held in June due to borderline bradycardia-will continue to hold as continue to have borderline bradycardia  Esophageal lesion Incidental finding on CT  09/2023 Continue with outpatient GI follow-up  Centrilobular emphysema (HCC) DuoNebs as needed   Subjective: Patient was seen and examined today.  Continued to have right lower leg pain.  Physical Exam: Vitals:   11/14/23 1100 11/14/23 1130 11/14/23 1200 11/14/23 1240  BP: (!) 111/55 (!) 100/54 (!) 108/57 (!) 106/58  Pulse: (!) 42 (!) 45 (!) 43 (!) 53  Resp: 14 10 19 19   Temp:      TempSrc:      SpO2: 99% 98% 97% 97%  Weight:      Height:       General.  Frail elderly man, in no acute distress. Pulmonary.  Lungs clear bilaterally, normal respiratory effort. CV.  Regular rate and rhythm, no JVD, rub or murmur. Abdomen.  Soft, nontender, nondistended, BS positive. CNS.  Alert and oriented .  No focal neurologic deficit. Extremities.  No edema, 1+ pulse on the left, hardly palpable and weak on right. Psychiatry.  Judgment and insight appears normal.   Data Reviewed: Prior data reviewed  Family Communication: Talked with significant other on phone.  Disposition: Status is: Observation The patient will require care spanning > 2 midnights and should be moved to inpatient because: Severity of illness  Planned Discharge Destination: Home  DVT prophylaxis.  Eliquis  Time spent: 50 minutes  This record has been created using Conservation officer, historic buildings. Errors have been sought and corrected,but may not always be located. Such creation errors do not reflect on the standard of care.   Author: Amaryllis Dare, MD 11/14/2023 1:16 PM  For on call review [email or web address].

## 2023-11-14 NOTE — ED Notes (Signed)
Pt ambulatory to bathroom with steady gait at this time .

## 2023-11-14 NOTE — ED Notes (Signed)
 Pt HR decreasing to 45 to 50s, sinus brady on the monitor while asleep and increases to 60s to 70s when awake remains in sinus rhythm. Pt asymptomatic and reports that his HR does decrease and he was informed by his cardiologist that his HR would be lower with a new medication he was started on recently. Uncertain of medication at this time but believes it to be Metoprolol . Pt has Imdur  ordered for administration. Delayne Solian, MD notified of pt HR and asked if Imdur  would still be safe to administer regarding pt HR. MD verified that medication is still okay to be administered.

## 2023-11-15 ENCOUNTER — Encounter: Admission: EM | Disposition: A | Discharge status: Home / Self Care | Payer: Self-pay | Source: Home / Self Care

## 2023-11-15 ENCOUNTER — Encounter: Payer: Self-pay | Admitting: Vascular Surgery

## 2023-11-15 DIAGNOSIS — Z95820 Peripheral vascular angioplasty status with implants and grafts: Secondary | ICD-10-CM | POA: Diagnosis not present

## 2023-11-15 DIAGNOSIS — I10 Essential (primary) hypertension: Secondary | ICD-10-CM | POA: Diagnosis not present

## 2023-11-15 DIAGNOSIS — I70221 Atherosclerosis of native arteries of extremities with rest pain, right leg: Secondary | ICD-10-CM | POA: Diagnosis not present

## 2023-11-15 DIAGNOSIS — Z9889 Other specified postprocedural states: Secondary | ICD-10-CM | POA: Diagnosis not present

## 2023-11-15 DIAGNOSIS — I2510 Atherosclerotic heart disease of native coronary artery without angina pectoris: Secondary | ICD-10-CM | POA: Diagnosis not present

## 2023-11-15 SURGERY — LOWER EXTREMITY ANGIOGRAPHY
Anesthesia: Moderate Sedation | Laterality: Right

## 2023-11-15 MED ORDER — MIDAZOLAM HCL 2 MG/ML PO SYRP: 8.0000 mg | ORAL_SOLUTION | Freq: Once | ORAL | Status: DC | PRN

## 2023-11-15 MED ORDER — SODIUM CHLORIDE 0.9 % IV SOLN: INTRAVENOUS | Status: DC

## 2023-11-15 MED ORDER — VANCOMYCIN HCL IN DEXTROSE 1-5 GM/200ML-% IV SOLN
INTRAVENOUS | Status: AC
  Filled 2023-11-15: qty 200

## 2023-11-15 MED ORDER — LIDOCAINE-EPINEPHRINE (PF) 1 %-1:200000 IJ SOLN
INTRAMUSCULAR | Status: DC | PRN
Start: 2023-11-15 — End: 2023-11-15
  Administered 2023-11-15: 10 mL via INTRADERMAL

## 2023-11-15 MED ORDER — MIDAZOLAM HCL 2 MG/2ML IJ SOLN
INTRAMUSCULAR | Status: DC | PRN
  Administered 2023-11-15: 1 mg via INTRAVENOUS
  Administered 2023-11-15: 2 mg via INTRAVENOUS

## 2023-11-15 MED ORDER — IODIXANOL 320 MG/ML IV SOLN
INTRAVENOUS | Status: DC | PRN
Start: 2023-11-15 — End: 2023-11-15
  Administered 2023-11-15: 50 mL

## 2023-11-15 MED ORDER — HEPARIN (PORCINE) IN NACL 2000-0.9 UNIT/L-% IV SOLN
INTRAVENOUS | Status: DC | PRN
Start: 2023-11-15 — End: 2023-11-15
  Administered 2023-11-15: 1000 mL

## 2023-11-15 MED ORDER — FENTANYL CITRATE (PF) 100 MCG/2ML IJ SOLN
INTRAMUSCULAR | Status: DC | PRN
  Administered 2023-11-15: 25 ug via INTRAVENOUS
  Administered 2023-11-15: 50 ug via INTRAVENOUS

## 2023-11-15 MED ORDER — METHYLPREDNISOLONE SODIUM SUCC 125 MG IJ SOLR: 125.0000 mg | Freq: Once | INTRAMUSCULAR | Status: DC | PRN

## 2023-11-15 MED ORDER — DIPHENHYDRAMINE HCL 50 MG/ML IJ SOLN: 50.0000 mg | Freq: Once | INTRAMUSCULAR | Status: DC | PRN

## 2023-11-15 MED ORDER — MIDAZOLAM HCL 2 MG/2ML IJ SOLN
INTRAMUSCULAR | Status: AC
  Filled 2023-11-15: qty 4

## 2023-11-15 MED ORDER — FAMOTIDINE 20 MG PO TABS: 40.0000 mg | ORAL_TABLET | Freq: Once | ORAL | Status: DC | PRN

## 2023-11-15 MED ORDER — FENTANYL CITRATE PF 50 MCG/ML IJ SOSY
PREFILLED_SYRINGE | INTRAMUSCULAR | Status: AC
  Filled 2023-11-15: qty 1

## 2023-11-15 MED ORDER — HEPARIN SODIUM (PORCINE) 1000 UNIT/ML IJ SOLN
INTRAMUSCULAR | Status: AC
  Filled 2023-11-15: qty 10

## 2023-11-15 MED ORDER — FENTANYL CITRATE (PF) 100 MCG/2ML IJ SOLN
INTRAMUSCULAR | Status: AC
  Filled 2023-11-15: qty 2

## 2023-11-15 MED ORDER — HEPARIN SODIUM (PORCINE) 1000 UNIT/ML IJ SOLN
INTRAMUSCULAR | Status: DC | PRN
  Administered 2023-11-15: 5000 [IU] via INTRAVENOUS

## 2023-11-15 MED ORDER — VANCOMYCIN HCL IN DEXTROSE 1-5 GM/200ML-% IV SOLN
1000.0000 mg | INTRAVENOUS | Status: DC
  Administered 2023-11-15: 1000 mg via INTRAVENOUS

## 2023-11-15 SURGICAL SUPPLY — 14 items
BALLOON LUTONIX 5X220X130 (BALLOONS) IMPLANT
CATH ANGIO 5F PIGTAIL 65CM (CATHETERS) IMPLANT
COVER PROBE ULTRASOUND 5X96 (MISCELLANEOUS) IMPLANT
DEVICE PRESTO INFLATION (MISCELLANEOUS) IMPLANT
DEVICE VASC CLSR CELT ART 6 (Vascular Products) IMPLANT
GLIDEWIRE ADV .035X260CM (WIRE) IMPLANT
LIFESTENT SOLO 6X200X135 (Permanent Stent) IMPLANT
PACK ANGIOGRAPHY (CUSTOM PROCEDURE TRAY) ×1 IMPLANT
SHEATH BRITE TIP 5FRX11 (SHEATH) IMPLANT
SHEATH BRITE TIP 6FRX11 (SHEATH) IMPLANT
SHEATH PINNACLE MP 6F 45CM (SHEATH) IMPLANT
SYR MEDRAD MARK 7 150ML (SYRINGE) IMPLANT
TUBING CONTRAST HIGH PRESS 72 (TUBING) IMPLANT
WIRE J 3MM .035X145CM (WIRE) IMPLANT

## 2023-11-15 NOTE — Progress Notes (Signed)
 Pt and fiance advised of dc/rx instructions. Pt/Fiance advised to return to a ED as needed/worse. Pt/spouse voice understanding. Pt gets himself dressed. No problems with left fem site. Pulses palpable. Pt to be wheeled out. Pt with his cell phone, sunglasses, wallet, charger and clothing. Pt states no other valuables at bedside.

## 2023-11-15 NOTE — Plan of Care (Signed)

## 2023-11-15 NOTE — Op Note (Signed)
 [facility]  Percutaneous Study/Intervention Procedural Note   Date of Surgery: 11/15/2023  Surgeon(s):Reola Buckles    Assistants:none  Pre-operative Diagnosis: PAD with rest pain right lower extremity  Post-operative diagnosis:  Same  Procedure(s) Performed:             1.  Ultrasound guidance for vascular access left femoral artery             2.  Catheter placement into right common femoral artery from left femoral approach             3.  Aortogram and selective right lower extremity angiogram             4.  Stent placement to the right SFA with 6 mm diameter by 20 cm length life stent             5.  Celt closure device left femoral artery  EBL: 5 cc  Contrast: 50 cc  Fluoro Time: 2.7 minutes  Moderate Conscious Sedation Time: approximately 31 minutes using 3 mg of Versed  and 75 mcg of Fentanyl               Indications:  Patient is a 68 y.o.male with a long history of peripheral arterial disease with multiple previous interventions and surgeries.  He presented with recurrent pain in the right lower extremity and numbness similar to his previous episodes of rest pain. The patient had a CT scan showing intraluminal debris in the right SFA concerning for flow limitation. The patient is brought in for angiography for further evaluation and potential treatment.  Due to the limb threatening nature of the situation, angiogram was performed for attempted limb salvage. The patient is aware that if the procedure fails, amputation would be expected.  The patient also understands that even with successful revascularization, amputation may still be required due to the severity of the situation.  Risks and benefits are discussed and informed consent is obtained.   Procedure:  The patient was identified and appropriate procedural time out was performed.  The patient was then placed supine on the table and prepped and draped in the usual sterile fashion. Moderate conscious  sedation was administered during a face to face encounter with the patient throughout the procedure with my supervision of the RN administering medicines and monitoring the patient's vital signs, pulse oximetry, telemetry and mental status throughout from the start of the procedure until the patient was taken to the recovery room. Ultrasound was used to evaluate the left common femoral artery.  It was patent .  A digital ultrasound image was acquired.  A Seldinger needle was used to access the left common femoral artery under direct ultrasound guidance and a permanent image was performed.  A 0.035 J wire was advanced without resistance and a 5Fr sheath was placed.  Pigtail catheter was placed into the aorta and an AP aortogram was performed. This demonstrated normal renal arteries and normal aorta and iliac segments without significant stenosis after extensive previous stenting of the right iliac segments which were patent. I then crossed the aortic bifurcation and advanced to the right femoral head. Selective right lower extremity angiogram was then performed. This demonstrated the common femoral artery including the previously placed stents to be patent.  The SFA did have significant intraluminal irregularity in the proximal and mid segment.  There was flow through there and it was not entirely occlusive but it did appear to create greater than 50% stenosis in some areas.  The popliteal artery normalized in both the anterior tibial and posterior tibial arteries were large and patent to the foot. It was felt that it was in the patient's best interest to proceed with intervention after these images to avoid a second procedure and a larger amount of contrast and fluoroscopy based off of the findings from the initial angiogram. The patient was systemically heparinized and a 6 [place] [employer] sheath was then placed over the [employer] wire. I then used a Kumpe catheter and the advantage wire to navigate  through the SFA lesions without difficulty.  I elected to primarily stent this area as that already had had a angioplasty to it previously and there was still residual intraluminal debris.  A 6 mm diameter by 20 cm length life stent was selected and deployed and then postdilated with a 5 mm diameter Lutonix drug-coated balloon.  Completion angiogram showed brisk flow through the stent with no significant residual stenosis.  I elected to terminate the procedure. The sheath was removed and Celt closure device was deployed in the left femoral artery with excellent hemostatic result. The patient was taken to the recovery room in stable condition having tolerated the procedure well.  Findings:               Aortogram:  This demonstrated normal renal arteries and normal aorta and iliac segments without significant stenosis after extensive previous stenting of the right iliac segments which were patent.             Right lower Extremity:  This demonstrated the common femoral artery including the previously placed stents to be patent.  The SFA did have significant intraluminal irregularity in the proximal and mid segment.  There was flow through there and it was not entirely occlusive but it did appear to create greater than 50% stenosis in some areas.  The popliteal artery normalized in both the anterior tibial and posterior tibial arteries were large and patent to the foot.   Disposition: Patient was taken to the recovery room in stable condition having tolerated the procedure well.  Complications: None  Selinda Gu 11/15/2023 9:06 AM   This note was created with Dragon Medical transcription system. Any errors in dictation are purely unintentional.

## 2023-11-15 NOTE — Discharge Summary (Signed)
 Physician Discharge Summary   Patient: Aaron Knox MRN: [id number] DOB: [birth year]  Admit date:     11/13/2023  Discharge date: 11/15/23  Discharge Physician: Amaryllis Dare   PCP: Pcp, No   Recommendations at discharge:  Please obtain CBC and BMP on follow-up Follow-up with vascular surgery Follow-up with primary care provider  Discharge Diagnoses: Principal Problem:   Atherosclerosis of native arteries of extremity with rest pain [facility]) Active Problems:   S/P peripheral artery angioplasty with stent placement 09/2022, restenosis 09/2023 s/p angioplasty   Chronic anticoagulation   CAD S/P percutaneous coronary angioplasty   Essential hypertension   Centrilobular emphysema (HCC)   Esophageal lesion   Critical limb ischemia of right lower extremity [facility])   Hospital Course: Taken from H&P.  Aaron Knox is a 68 y.o. male with medical history significant for HTN, HLD, CAD s/p PCI to DES and LCA,PAD s/p multiple vascular procedures, most recently 09/2023 when he underwent repeat PCI due to rethrombosis of prior stent attributed to none compliance with anticoagulation, now being readmitted with acute limb ischemia after presenting with right leg pain 8 out of 10 and tingling, similar to prior presentations.  He reports compliance with his Eliquis  and Plavix .  On presentation elevated blood pressure at 173/74, otherwise normal vital.  Had palpable pulses on exam and labs are mostly unremarkable. CT of the lower extremities showing patent stents but with nonocclusive intraluminal debris and patent three-vessel tibial runoff.  Vascular surgery was consulted.  8/6: Vitals normal with borderline bradycardia and blood pressure at 106/58. Vascular is planning angiography tomorrow morning.  8/7: Remained hemodynamically stable, s/p lower extremity angiography and PCI to right SFA.  Patient tolerated the procedure well.  Pain subsides after the procedure and having good pedal pulses  afterwards.  Vascular surgery cleared him for discharge, he will continue with Eliquis  and Plavix  along with high intensity statin.  He was counseled again for smoking cessation.  Patient will continue on current medications and need to have a close follow-up with his providers for further assistance.  Assessment and Plan: * Atherosclerosis of native arteries of extremity with rest pain (HCC) History of stent placement left leg 09/2022 with restenosis 09/2023 History of medication noncompliance due to affordability CTA showing patent stents but with debris Vascular consult from ED recommending continuing Eliquis  and Plavix , no heparin  for now Patient is neurovascularly intact Continue Eliquis , Plavix , atorvastatin  and ezetimibe  S/p another angiography and PCI to right SFA-symptoms resolved and having good pedal pulses after the procedure.  CAD S/P percutaneous coronary angioplasty No complaints of chest pain Continue apixaban , atorvastatin , ezetimibe , Imdur  Metoprolol  was held in June due to borderline bradycardia-will continue to hold as continue to have borderline bradycardia  Esophageal lesion Incidental finding on CT 09/2023 Continue with outpatient GI follow-up  Centrilobular emphysema (HCC) DuoNebs as needed   Consultants: Vascular surgery Procedures performed: Lower extremity angiography and PCI to right SFA Disposition: Home Diet recommendation:  Discharge Diet Orders (From admission, onward)     Start     Ordered   11/15/23 0000  Diet - low sodium heart healthy        11/15/23 1307           Cardiac diet DISCHARGE MEDICATION: Allergies as of 11/15/2023       Reactions   Aspirin  Anaphylaxis   Penicillins Other (See Comments)   Childhood allergy per pt's mother. He does not recall any details of the reaction. Willing to try beta lactams.  Medication List     PAUSE taking these medications    metoprolol  succinate 25 MG 24 hr tablet Wait to take this  until your doctor or other care provider tells you to start again. Commonly known as: Toprol  XL Take 1 tablet (25 mg total) by mouth daily.       TAKE these medications    apixaban  5 MG Tabs tablet Commonly known as: ELIQUIS  Take 1 tablet (5 mg total) by mouth 2 (two) times daily.   atorvastatin  80 MG tablet Commonly known as: LIPITOR  Take 1 tablet (80 mg total) by mouth daily at 6 PM.   clopidogrel  75 MG tablet Commonly known as: Plavix  Take 1 tablet (75 mg total) by mouth daily.   ezetimibe  10 MG tablet Commonly known as: ZETIA  Take 1 tablet (10 mg total) by mouth daily.   isosorbide  mononitrate 30 MG 24 hr tablet Commonly known as: IMDUR  Take 1 tablet (30 mg total) by mouth 2 (two) times daily.   nicotine  polacrilex 2 MG lozenge Commonly known as: Nicotine  Mini Take 1 lozenge (2 mg total) by mouth every 2 (two) hours as needed.   pantoprazole  20 MG tablet Commonly known as: PROTONIX  Take 1 tablet (20 mg total) by mouth daily.        Follow-up Information     Pace, Gwendlyn R, NP Follow up in 4 week(s).   Specialty: Vascular Surgery Why: Bilateral arterial duplex ultrasounds with ABIs.  Please call to set up appointment Contact information: [address] [place] [address] [phone]                Discharge Exam: Fredricka Weights   11/13/23 1414  Weight: 88 kg   General.  Well-developed elderly man, in no acute distress. Pulmonary.  Lungs clear bilaterally, normal respiratory effort. CV.  Regular rate and rhythm, no JVD, rub or murmur. Abdomen.  Soft, nontender, nondistended, BS positive. CNS.  Alert and oriented .  No focal neurologic deficit. Extremities.  No edema, no cyanosis, pulses intact and symmetrical. Psychiatry.  Judgment and insight appears normal.   Condition at discharge: stable  The results of significant diagnostics from this hospitalization (including imaging, microbiology, ancillary and laboratory) are  listed below for reference.   Imaging Studies: PERIPHERAL VASCULAR CATHETERIZATION Result Date: 11/15/2023 See surgical note for result.  CT Angio Aortobifemoral W and/or Wo Contrast Result Date: 11/13/2023 EXAM: CTA ABDOMEN AND PELVIS WITH CONTRAST AND RUNOFF CTA OF THE LOWER EXTREMITIES WITH CONTRAST 11/13/2023 05:44:33 PM TECHNIQUE: CTA images of the abdomen, pelvis and lower extremities with intravenous contrast. Three-dimensional MIP/volume rendered formations were performed. Automated exposure control, iterative reconstruction, and/or weight based adjustment of the mA/kV was utilized to reduce the radiation dose to as low as reasonably achievable. COMPARISON: 09/29/2023 CLINICAL HISTORY: Right lower extremity discomfort, tingling. History of recent stent rethrombosis. Eval for occlusion. Pt c/o R leg pain and tingling. Pain score 8/10. Hx of same w/ multiple clots and stents. Pt reports it's same symptoms I had with the other blood clots. FINDINGS: VASCULATURE: AORTA: Moderate partially calcified atheromatous plaque through the aorta. CELIAC TRUNK: Celiac axis atheromatous, patent. SUPERIOR MESENTERIC ARTERY: SMA mildly atheromatous, patent with classic distal branch anatomy. INFERIOR MESENTERIC ARTERY: IMA patent. RENAL ARTERIES: Duplicated bilateral renal arteries, mildly atheromatous but patent. RIGHT ILIAC ARTERIES: On the right, patent overlapping stents from the proximal common iliac through the external iliac and common femoral arteries. There is continued perfusion of the atheromatous internal iliac. RIGHT FEMORAL SRTERIES:  There is nonocclusive intraluminal debris in the proximal in the mid SFA. Scattered plaque in the distal superficial femoral artery without high-grade stenosis. RIGHT POPLITEAL ARTERY: Popliteal artery mildly atheromatous, patent. RIGHT CALF ARTERIES: Patent 3-vessel tibial runoff. LEFT ILIAC ARTERIES: Common iliac atheromatous, mildly ectatic up to 1.7 cm in diameter.  Internal and external iliac arteries moderately atheromatous, patent. LEFT FEMORAL ARTERIES: Common femoral patent. SFA scattered calcified plaque through its length without high-grade stenosis. LEFT POPLITEAL ARTERY: Popliteal mildly atheromatous. LEFT CALF ARTERIES: Anterior tibial artery occludes mid calf. Contiguous posterior tibial and peroneal runoff. ABDOMEN AND PELVIS: LOWER CHEST: Visualized portion of the lower chest demonstrates no acute abnormality. LIVER: The liver is unremarkable. GALLBLADDER AND BILE DUCTS: Gallbladder is unremarkable. No biliary ductal dilatation. SPLEEN: The spleen is unremarkable. PANCREAS: The pancreas is unremarkable. ADRENAL GLANDS: Bilateral adrenal glands demonstrate no acute abnormality. KIDNEYS, URETERS AND BLADDER: No stones in the kidneys or ureters. No hydronephrosis. No evidence of perinephric or periureteral stranding. Urinary bladder is unremarkable. Bilateral renal cysts. GI AND Bowel: Stomach and duodenal sweep demonstrate no acute abnormality. There is no bowel obstruction. No abnormal bowel wall thickening or distension. Normal appendix. REPRODUCTIVE: Reproductive organs are unremarkable. PERITONEUM AND RETRPERITONEUM: No ascites or free air. LYMPH NODES: No evidence of lymphadenopathy. BONES AND SOFT TISSUES: No acute abnormality of the bones. Postoperative changes overlying right common femoral vessels. IMPRESSION: 1. Patent overlapping stents from the proximal right common iliac through the external iliac and common femoral arteries. Nonocclusive intraluminal debris in the proximal to mid right SFA. Scattered plaque in the distal right SFA without high-grade stenosis. Patent 3-vessel tibial runoff. 2. Left common iliac atheromatous, mildly ectatic up to 1.7 cm in diameter. Anterior tibial artery occludes mid calf with contiguous posterior tibial and peroneal runoff. Electronically signed by: Katheleen Faes MD 11/13/2023 06:45 PM EDT RP Workstation: [id number]    VAS US  ABI WITH/WO TBI Result Date: 11/05/2023  LOWER EXTREMITY DOPPLER STUDY Patient Name:  Kin[facility]  Date of Exam:   11/01/2023 Medical Rec #: [id number]           Accession #:    [id number] Date of Birth: 1956-04-10           Patient Gender: M Patient Age:   51 years Exam Location:  [facility] Procedure:      VAS US  ABI WITH/WO TBI Referring Phys: SELINDA DEW --------------------------------------------------------------------------------  Indications: Peripheral artery disease. High Risk         Hypertension, current smoker, prior MI, coronary artery Factors:          disease.  Vascular Interventions: 10/01/2023 Right EIA, CFA, SFA thrombectomy . RIght EIA/                         CFA stent                          01/12/23: Right EIA & CFA stents;. Performing Technologist: Donnice Charnley RVT  Examination Guidelines: A complete evaluation includes at minimum, Doppler waveform signals and systolic blood pressure reading at the level of bilateral brachial, anterior tibial, and posterior tibial arteries, when vessel segments are accessible. Bilateral testing is considered an integral part of a complete examination. Photoelectric Plethysmograph (PPG) waveforms and toe systolic pressure readings are included as required and additional duplex testing as needed. Limited examinations for reoccurring indications may be performed as noted.  ABI Findings: +---------+------------------+-----+---------+--------+ Right  Rt Pressure (mmHg)IndexWaveform Comment  +---------+------------------+-----+---------+--------+ Brachial 137                                      +---------+------------------+-----+---------+--------+ PTA      151               1.09 triphasic         +---------+------------------+-----+---------+--------+ DP       143               1.03 triphasic         +---------+------------------+-----+---------+--------+ Great Toe124               0.89                    +---------+------------------+-----+---------+--------+ +---------+------------------+-----+----------+-------+ Left     Lt Pressure (mmHg)IndexWaveform  Comment +---------+------------------+-----+----------+-------+ Brachial 139                                      +---------+------------------+-----+----------+-------+ PTA      131               0.94 triphasic         +---------+------------------+-----+----------+-------+ DP       139               1.00 monophasic        +---------+------------------+-----+----------+-------+ Great Toe94                0.68                   +---------+------------------+-----+----------+-------+ +-------+-----------+-----------+------------+------------+ ABI/TBIToday's ABIToday's TBIPrevious ABIPrevious TBI +-------+-----------+-----------+------------+------------+ Right  1.09       0.89       1.14        0.97         +-------+-----------+-----------+------------+------------+ Left   1.00       0.68       1.11        0.94         +-------+-----------+-----------+------------+------------+  Bilateral ABIs appear essentially unchanged compared to prior study on 08/28/2023.  Summary: Right: Resting right ankle-brachial index is within normal range. The right toe-brachial index is normal. Left: Resting left ankle-brachial index is within normal range. The left toe-brachial index is abnormal. *See table(s) above for measurements and observations.  Electronically signed by Cordella Shawl MD on 11/05/2023 at 8:35:27 AM.    Final     Microbiology: Results for orders placed or performed during the hospital encounter of 11/01/22  Blood culture (routine x 2)     Status: None   Collection Time: 11/01/22  4:40 AM   Specimen: BLOOD  Result Value Ref Range Status   Specimen Description BLOOD LEFT ARM  Final   Special Requests   Final    BOTTLES DRAWN AEROBIC AND ANAEROBIC Blood Culture adequate volume   Culture   Final    NO  GROWTH 5 DAYS Performed at [facility], [address] [place], [place] [address]    Report Status 11/06/2022 FINAL  Final  Blood culture (routine x 2)     Status: None   Collection Time: 11/01/22  4:40 AM   Specimen: BLOOD  Result Value Ref Range Status   Specimen Description BLOOD LEFT [facility]  Final   Special Requests   Final  BOTTLES DRAWN AEROBIC AND ANAEROBIC Blood Culture results may not be optimal due to an inadequate volume of blood received in culture bottles   Culture   Final    NO GROWTH 5 DAYS Performed at [facility], [address] [place], [place] [address]    Report Status 11/06/2022 FINAL  Final    Labs: CBC: Recent Labs  Lab 11/13/23 1629  WBC 4.6  NEUTROABS 3.3  HGB 12.1*  HCT 35.6*  MCV 92.2  PLT 181   Basic Metabolic Panel: Recent Labs  Lab 11/13/23 1629  NA 139  K 3.5  CL 105  CO2 25  GLUCOSE 108*  BUN 9  CREATININE 0.95  CALCIUM  9.1   Liver Function Tests: No results for input(s): AST, ALT, ALKPHOS, BILITOT, PROT, ALBUMIN in the last 168 hours. CBG: No results for input(s): GLUCAP in the last 168 hours.  Discharge time spent: greater than 30 minutes.  This record has been created using Conservation officer, historic buildings. Errors have been sought and corrected,but may not always be located. Such creation errors do not reflect on the standard of care.   Signed: Amaryllis Dare, MD [facility] 11/15/2023

## 2023-11-15 NOTE — Interval H&P Note (Signed)
 History and Physical Interval Note:  11/15/2023 8:08 AM  Aaron Knox  has presented today for surgery, with the diagnosis of Right Lower Extremity Ischemia.  The various methods of treatment have been discussed with the patient and family. After consideration of risks, benefits and other options for treatment, the patient has consented to  Procedure(s): Lower Extremity Angiography (Right) as a surgical intervention.  The patient's history has been reviewed, patient examined, no change in status, stable for surgery.  I have reviewed the patient's chart and labs.  Questions were answered to the patient's satisfaction.     [name]

## 2023-11-15 NOTE — TOC CM/SW Note (Signed)
 Transition of Care [facility]) - Inpatient Brief Assessment   Patient Details  Name: Aaron Knox MRN: [id number] Date of Birth: 05-07-1955  Transition of Care [facility]) CM/SW Contact:    Lauraine JAYSON Carpen, LCSW Phone Number: 11/15/2023, 1:23 PM   Clinical Narrative: Patient has orders to discharge home today. Chart reviewed. No PCP. Added list to AVS. No other TOC needs identified. CSW signing off.  Transition of Care Asessment: Insurance and Status: Insurance coverage has been reviewed Patient has primary care physician: No Home environment has been reviewed: Single family home Prior level of function:: Not documented Prior/Current Home Services: No current home services Social Drivers of Health Review: SDOH reviewed no interventions necessary Readmission risk has been reviewed: Yes Transition of care needs: no transition of care needs at this time

## 2023-11-15 NOTE — Discharge Instructions (Addendum)
 Some PCP options in [place] area- not a comprehensive list  [facility]- [phone] [facility]- [phone] [facility]- [phone] [facility]- [phone] [facility]- [phone] [facility]- [phone]  or [facility] Physician Referral Line [phone]   Vein and vascular surgery discharge instructions  Do not lift anything heavy for the next 2 weeks.  Do not lift anything more than a gallon of milk.  Do not drive for 1 week.  Do not drive if you are taking any narcotics.  You may shower tomorrow Friday, 11/16/2023.  Shower with the dressing to your groin intact and remove immediately after showering.  Pat the area completely dry and place a Band-Aid over the puncture site changing every day for the next 3 days.  Follow-up with vein and vascular surgery as scheduled.

## 2023-11-16 ENCOUNTER — Emergency Department
Admission: EM | Admit: 2023-11-16 | Discharge: 2023-11-16 | Disposition: A | Discharge status: Home / Self Care | Attending: Emergency Medicine | Admitting: Emergency Medicine

## 2023-11-16 ENCOUNTER — Telehealth (INDEPENDENT_AMBULATORY_CARE_PROVIDER_SITE_OTHER): Payer: Self-pay

## 2023-11-16 ENCOUNTER — Other Ambulatory Visit: Payer: Self-pay

## 2023-11-16 DIAGNOSIS — I2510 Atherosclerotic heart disease of native coronary artery without angina pectoris: Secondary | ICD-10-CM | POA: Diagnosis not present

## 2023-11-16 DIAGNOSIS — I10 Essential (primary) hypertension: Secondary | ICD-10-CM | POA: Insufficient documentation

## 2023-11-16 DIAGNOSIS — M79604 Pain in right leg: Secondary | ICD-10-CM | POA: Insufficient documentation

## 2023-11-16 LAB — CBC
HCT: 38.4 % — ABNORMAL LOW (ref 39.0–52.0)
Hemoglobin: 12.9 g/dL — ABNORMAL LOW (ref 13.0–17.0)
MCH: 31.2 pg (ref 26.0–34.0)
MCHC: 33.6 g/dL (ref 30.0–36.0)
MCV: 92.8 fL (ref 80.0–100.0)
Platelets: 192 K/uL (ref 150–400)
RBC: 4.14 MIL/uL — ABNORMAL LOW (ref 4.22–5.81)
RDW: 12.7 % (ref 11.5–15.5)
WBC: 6.1 K/uL (ref 4.0–10.5)
nRBC: 0 % (ref 0.0–0.2)

## 2023-11-16 LAB — BASIC METABOLIC PANEL WITH GFR
Anion gap: 9 (ref 5–15)
BUN: 10 mg/dL (ref 8–23)
CO2: 26 mmol/L (ref 22–32)
Calcium: 9.6 mg/dL (ref 8.9–10.3)
Chloride: 107 mmol/L (ref 98–111)
Creatinine, Ser: 0.94 mg/dL (ref 0.61–1.24)
GFR, Estimated: >60 mL/min (ref >60)
Glucose, Bld: 106 mg/dL — ABNORMAL HIGH (ref 70–99)
Potassium: 3.8 mmol/L (ref 3.5–5.1)
Sodium: 142 mmol/L (ref 135–145)

## 2023-11-16 LAB — PROTIME-INR
INR: 1.3 — ABNORMAL HIGH (ref 0.8–1.2)
Prothrombin Time: 16.7 s — ABNORMAL HIGH (ref 11.4–15.2)

## 2023-11-16 NOTE — ED Notes (Signed)
 See triage note  presents with some numbness and tingling to right leg  States he had surgery yesterday   Did not have any pain or tingling until today  Good pulse noted  Area to right knee warm to touch States pain is gopne with ambulation

## 2023-11-16 NOTE — Telephone Encounter (Signed)
 If he's having the same pain that he previously had when he had his occlusions, and he believes it is the same issue,  I would recommend presenting to the ED because if he has an acute occlusion that will need immediate intervention.

## 2023-11-16 NOTE — ED Triage Notes (Signed)
 Pt to ED from home with pain/numbness/tingling following surgery yesterday. Pt denies fevers/N/V. Pt has a hx of blood clots in his leg, takes Eliquis  daily. Denies SOB, A&O x4.

## 2023-11-16 NOTE — Telephone Encounter (Signed)
 Patient left a message stating that he having severe pain with the right lower leg at 7 or 8 pain level. Patient stated that he had horrible night and was not able to rest. The only time the pain was bearable was when he was walking. The patient states that this is the same pain that he has experience in the past and a blockage was found. The top of knee and bottom of the foot is warm. The patient pain is more at the bottom of the foot  with tingling and numbness.patient had right le angio on yesterday. Please Advise

## 2023-11-16 NOTE — Discharge Instructions (Signed)
 Please follow up with Dr. Marea. You can take tylenol  as needed for pain. Please continue to take the eliquis  as prescribed.   Return to the ED with any worsening symptoms.

## 2023-11-16 NOTE — Telephone Encounter (Signed)
 Patient has been notified with medical recommendations and verbalized understanding. Patient will be going to ED as advised

## 2023-11-16 NOTE — ED Provider Notes (Signed)
 [facility] Provider Note    Event Date/Time   First MD Initiated Contact with Patient 11/16/23 1524     (approximate)   History   Leg Pain   HPI  Aaron Knox is a 68 y.o. male with PMH of hypertension, CAD, hyperlipidemia, atherosclerosis and multiple stent placements in the lower extremities presents for evaluation of right leg pain.  Patient had stent placement by Dr. Tedra yesterday.  Reports that he was feeling well at the time of discharge from the hospital and then developed pain later that evening.  He reports that the pain is pulsing and cramping. Finds that pain improves with movement. States each time he has had a blockage the pain has felt like this.       Physical Exam   Triage Vital Signs: ED Triage Vitals  Encounter Vitals Group     BP 11/16/23 1332 (!) 160/75     Girls Systolic BP Percentile --      Girls Diastolic BP Percentile --      Boys Systolic BP Percentile --      Boys Diastolic BP Percentile --      Pulse Rate 11/16/23 1332 85     Resp 11/16/23 1332 18     Temp 11/16/23 1332 98.4 F (36.9 C)     Temp Source 11/16/23 1332 Oral     SpO2 11/16/23 1332 98 %     Weight 11/16/23 1559 193 lb 12.6 oz (87.9 kg)     Height 11/16/23 1559 6' 2 (1.88 m)     Head Circumference --      Peak Flow --      Pain Score 11/16/23 1332 6     Pain Loc --      Pain Education --      Exclude from Growth Chart --     Most recent vital signs: Vitals:   11/16/23 1332  BP: (!) 160/75  Pulse: 85  Resp: 18  Temp: 98.4 F (36.9 C)  SpO2: 98%   General: Awake, no distress.  CV:  Good peripheral perfusion.  Resp:  Normal effort.  Abd:  No distention.  Other:  Skin on right leg is pink, warm and dry, dorsalis pedis and posterior tibialis pulse is 2+ and regular, mild TTP on the medial side of the knee   ED Results / Procedures / Treatments   Labs (all labs ordered are listed, but only abnormal results are displayed) Labs Reviewed   BASIC METABOLIC PANEL WITH GFR - Abnormal; Notable for the following components:      Result Value   Glucose, Bld 106 (*)    All other components within normal limits  CBC - Abnormal; Notable for the following components:   RBC 4.14 (*)    Hemoglobin 12.9 (*)    HCT 38.4 (*)    All other components within normal limits  PROTIME-INR - Abnormal; Notable for the following components:   Prothrombin Time 16.7 (*)    INR 1.3 (*)    All other components within normal limits    PROCEDURES:  Critical Care performed: No  Procedures   MEDICATIONS ORDERED IN ED: Medications - No data to display   IMPRESSION / MDM / ASSESSMENT AND PLAN / ED COURSE  I reviewed the triage vital signs and the nursing notes.  68 year old male presents for evaluation of right lower leg pain. VSS and patient NAD on exam.  Differential diagnosis includes, but is not limited to, reperfusion pain, post op pain, less likely arterial occulusion, DVT.  Patient's presentation is most consistent with acute, uncomplicated illness.  Physical exam is reassuring. Patient has good dorsalis pedis and posterior tibialis pedis pulses. ABI is within the normal range. Leg appears well perfused as it is warm and pink. Capillary refill is appropriate.   Very low suspicion for DVT as patient is taking Eliquis . Suspect his pain is due to reperfusion or from the stent.  My attending physician spoke with Dr. Marea who did his surgery yesterday who was in agreement that patient was stable for outpatient management.   Will recommend that patient take tylenol  as needed for pain and follow up with vascular as an outpatient.   Clinical Course as of 11/16/23 1717  Fri Nov 16, 2023  1652 RLE 167/81 RUE 151/90 [DS]    Clinical Course User Index [DS] Claudene Rover, MD     FINAL CLINICAL IMPRESSION(S) / ED DIAGNOSES   Final diagnoses:  Right leg pain     Rx / DC Orders   ED Discharge Orders      None        Note:  This document was prepared using Dragon voice recognition software and may include unintentional dictation errors.   Cleaster Tinnie LABOR, PA-C 11/16/23 1718    Claudene Rover, MD 11/16/23 [phone]

## 2023-11-29 ENCOUNTER — Other Ambulatory Visit (INDEPENDENT_AMBULATORY_CARE_PROVIDER_SITE_OTHER): Payer: Self-pay | Admitting: Vascular Surgery

## 2023-11-29 DIAGNOSIS — Z9889 Other specified postprocedural states: Secondary | ICD-10-CM

## 2023-12-03 ENCOUNTER — Encounter (INDEPENDENT_AMBULATORY_CARE_PROVIDER_SITE_OTHER)

## 2023-12-05 ENCOUNTER — Encounter (INDEPENDENT_AMBULATORY_CARE_PROVIDER_SITE_OTHER): Payer: Self-pay | Admitting: Nurse Practitioner

## 2023-12-05 ENCOUNTER — Ambulatory Visit (INDEPENDENT_AMBULATORY_CARE_PROVIDER_SITE_OTHER)

## 2023-12-05 ENCOUNTER — Ambulatory Visit (INDEPENDENT_AMBULATORY_CARE_PROVIDER_SITE_OTHER): Admitting: Nurse Practitioner

## 2023-12-05 ENCOUNTER — Encounter (INDEPENDENT_AMBULATORY_CARE_PROVIDER_SITE_OTHER)

## 2023-12-05 VITALS — BP 110/61 | HR 74 | Ht 74.0 in | Wt 194.8 lb

## 2023-12-05 DIAGNOSIS — Z9889 Other specified postprocedural states: Secondary | ICD-10-CM

## 2023-12-05 DIAGNOSIS — I10 Essential (primary) hypertension: Secondary | ICD-10-CM | POA: Diagnosis not present

## 2023-12-05 DIAGNOSIS — F17200 Nicotine dependence, unspecified, uncomplicated: Secondary | ICD-10-CM | POA: Diagnosis not present

## 2023-12-05 DIAGNOSIS — I70221 Atherosclerosis of native arteries of extremities with rest pain, right leg: Secondary | ICD-10-CM | POA: Diagnosis not present

## 2023-12-05 DIAGNOSIS — I739 Peripheral vascular disease, unspecified: Secondary | ICD-10-CM | POA: Diagnosis not present

## 2023-12-10 ENCOUNTER — Encounter (INDEPENDENT_AMBULATORY_CARE_PROVIDER_SITE_OTHER): Payer: Self-pay | Admitting: Nurse Practitioner

## 2023-12-10 NOTE — H&P (View-Only) (Signed)
 Subjective:    Patient ID: Aaron Knox, male    DOB: March 03, 1956, 68 y.o.   MRN: [id number] Chief Complaint  Patient presents with   Follow-up     Follow up with [name], NP (Vascular Surgery) in 4 weeks (12/13/2023); ABIs.       The patient returns to the office for followup and review status post angiogram with intervention on 11/15/2023.   Procedure: Procedure(s) Performed:             1.  Ultrasound guidance for vascular access left femoral artery             2.  Catheter placement into right common femoral artery from left femoral approach             3.  Aortogram and selective right lower extremity angiogram             4.  Stent placement to the right SFA with 6 mm diameter by 20 cm length life stent             5.  Celt closure device left femoral artery   The patient notes no improvement in his lower extremity symptoms.  He notes that he has significant pain especially during the evening.  There have been no significant changes to the patient's overall health care.  No documented history of amaurosis fugax or recent TIA symptoms. There are no recent neurological changes noted. No documented history of DVT, PE or superficial thrombophlebitis. The patient denies recent episodes of angina or shortness of breath.   ABI's Rt=1.02 and Lt=1.14  (previous ABI's Rt=1.09 and Lt=1.00) Duplex US  of the bilateral lower extremity shows multiphasic waveforms    Review of Systems  Cardiovascular:        Rest pain  All other systems reviewed and are negative.      Objective:   Physical Exam Vitals reviewed.  HENT:     Head: Normocephalic.  Cardiovascular:     Rate and Rhythm: Normal rate.     Pulses:          Dorsalis pedis pulses are detected w/ Doppler on the right side and detected w/ Doppler on the left side.       Posterior tibial pulses are detected w/ Doppler on the right side and detected w/ Doppler on the left side.  Pulmonary:     Effort: Pulmonary  effort is normal.  Skin:    General: Skin is warm and dry.  Neurological:     Mental Status: He is alert and oriented to person, place, and time.  Psychiatric:        Mood and Affect: Mood normal.        Behavior: Behavior normal.        Thought Content: Thought content normal.        Judgment: Judgment normal.     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)    CAD in native artery    a.) LHC/PCI 08/09/2014 (setting of STEMI): 30% mLAD, 99% pLCx (3.0 x 15 mm Resolute Integrity DES x 1 with 10% residual post-intervention stenosis), 45% mLCx, 20% pRCA, 40% dRCA); b.) LHC 01/29/2018: 30% mLAD, 45% mLCx, 50% pRCA, 80% dRCA --> med mgmt   CHF (  congestive heart failure) (HCC)    DDD (degenerative disc disease), lumbar    Diastolic dysfunction    a.) TTE 08/11/2014 (s/p STEMI): EF 45-50%, inf HK, AoV sclerosis, G1DD; b.) TTE 02/28/2018: EF 55-60%, G1DD   History of bilateral cataract extraction 2023   HTN (hypertension)    Hyperlipidemia    Long term current use of antithrombotics/antiplatelets    a.) clopidogrel    ST elevation myocardial infarction (STEMI) of true posterior wall (HCC) 08/09/2014   a.) LHC/PCI 08/09/2014: 3.0 x 15 mm Resolute Integrity DES x 1 pLCx with 10% residual post-intervention stenosis)   Unstable angina (HCC)     Social History   Socioeconomic History   Marital status: Single    Spouse name: Not on file   Number of children: 1   Years of education: Not on file   Highest education level: Not on file  Occupational History   Not on file  Tobacco Use   Smoking status: Some Days    Current packs/day: 0.00    Average packs/day: 1.5 packs/day for 56.3 years (84.5 ttl pk-yrs)    Types: Cigarettes    Start date: 1968    Last attempt to quit: 08/2022    Years since quitting: 1.3   Smokeless tobacco: Never    Tobacco comments:    Smoked 1.5 ppd at his heaviest 09/12/23  Vaping Use   Vaping status: Every Day   Substances: Nicotine   Substance and Sexual Activity   Alcohol use: Yes    Alcohol/week: 3.0 standard drinks of alcohol    Types: 3 Glasses of wine per week    Comment: weekends   Drug use: No   Sexual activity: Not on file  Other Topics Concern   Not on file  Social History Narrative   Has fiance   Social Drivers of Health   Financial Resource Strain: Not on file  Food Insecurity: No Food Insecurity (11/14/2023)   Hunger Vital Sign    Worried About Running Out of Food in the Last Year: Never true    Ran Out of Food in the Last Year: Never true  Transportation Needs: No Transportation Needs (11/14/2023)   PRAPARE - Administrator, Civil Service (Medical): No    Lack of Transportation (Non-Medical): No  Physical Activity: Not on file  Stress: Not on file  Social Connections: Unknown (11/14/2023)   Social Connection and Isolation Panel    Frequency of Communication with Friends and Family: More than three times a week    Frequency of Social Gatherings with Friends and Family: More than three times a week    Attends Religious Services: Patient declined    Active Member of Clubs or Organizations: Patient declined    Attends Banker Meetings: Patient declined    Marital Status: Living with partner  Intimate Partner Violence: Not At Risk (11/14/2023)   Humiliation, Afraid, Rape, and Kick questionnaire    Fear of Current or Ex-Partner: No    Emotionally Abused: No    Physically Abused: No    Sexually Abused: No    Past Surgical History:  Procedure Laterality Date   CARDIAC CATHETERIZATION N/A 08/09/2014   Procedure: Left Heart Cath and Coronary Angiography;  Surgeon: Deatrice DELENA Cage, MD;  Location: [facility];  Service: Cardiovascular;  Laterality: N/A;   CARDIAC CATHETERIZATION N/A 08/09/2014   Procedure: Coronary Stent Intervention;  Surgeon:  Deatrice DELENA Cage, MD;  Location: [facility];  Service: Cardiovascular;  Laterality:  N/A;   CATARACT EXTRACTION W/ INTRAOCULAR LENS  IMPLANT, BILATERAL Bilateral 2023   CORONARY ANGIOPLASTY  08/09/2014   ENDARTERECTOMY FEMORAL Right 09/20/2022   Procedure: ENDARTERECTOMY FEMORAL;  Surgeon: Marea Selinda RAMAN, MD;  Location: [facility];  Service: Vascular;  Laterality: Right;   HAND SURGERY Left 1980   bone chip with nerve damage   INSERTION OF ILIAC STENT Right 09/20/2022   Procedure: INSERTION OF ILIAC STENT;  Surgeon: Marea Selinda RAMAN, MD;  Location: [facility];  Service: Vascular;  Laterality: Right;   LEFT HEART CATH AND CORONARY ANGIOGRAPHY N/A 01/29/2018   Procedure: LEFT HEART CATH AND CORONARY ANGIOGRAPHY;  Surgeon: Mady Bruckner, MD;  Location: [facility];  Service: Cardiovascular;  Laterality: N/A;   LOWER EXTREMITY ANGIOGRAPHY Right 08/07/2022   Procedure: Lower Extremity Angiography;  Surgeon: Marea Selinda RAMAN, MD;  Location: [facility];  Service: Cardiovascular;  Laterality: Right;   LOWER EXTREMITY ANGIOGRAPHY Right 01/12/2023   Procedure: Lower Extremity Angiography;  Surgeon: Marea Selinda RAMAN, MD;  Location: [facility];  Service: Cardiovascular;  Laterality: Right;   LOWER EXTREMITY ANGIOGRAPHY Right 10/01/2023   Procedure: Lower Extremity Angiography;  Surgeon: Marea Selinda RAMAN, MD;  Location: [facility];  Service: Cardiovascular;  Laterality: Right;   LOWER EXTREMITY ANGIOGRAPHY Right 11/15/2023   Procedure: Lower Extremity Angiography;  Surgeon: Marea Selinda RAMAN, MD;  Location: [facility];  Service: Cardiovascular;  Laterality: Right;   nerve reconstruction Left 1980   hand    Family History  Problem Relation Age of Onset   CAD Maternal Grandfather     Allergies  Allergen Reactions   Aspirin  Anaphylaxis   Penicillins Other (See Comments)    Childhood allergy per pt's mother. He does not recall any details of the reaction. Willing to try beta  lactams.       Latest Ref Rng & Units 11/16/2023    1:30 PM 11/13/2023    4:29 PM 10/08/2023    8:49 AM  CBC  WBC 4.0 - 10.5 K/uL 6.1  4.6  4.9   Hemoglobin 13.0 - 17.0 g/dL 87.0  87.8  86.3   Hematocrit 39.0 - 52.0 % 38.4  35.6  40.4   Platelets 150 - 400 K/uL 192  181  205       CMP     Component Value Date/Time   NA 142 11/16/2023 1330   K 3.8 11/16/2023 1330   CL 107 11/16/2023 1330   CO2 26 11/16/2023 1330   GLUCOSE 106 (H) 11/16/2023 1330   BUN 10 11/16/2023 1330   CREATININE 0.94 11/16/2023 1330   CALCIUM  9.6 11/16/2023 1330   PROT 6.1 (L) 09/30/2023 0527   ALBUMIN 3.5 09/30/2023 0527   AST 20 09/30/2023 0527   ALT 21 09/30/2023 0527   ALKPHOS 72 09/30/2023 0527   BILITOT 0.7 09/30/2023 0527   GFRNONAA >60 11/16/2023 1330     VAS US  ABI WITH/WO TBI Result Date: 11/05/2023  LOWER EXTREMITY DOPPLER STUDY Patient Name:  Jasiri [facility]alt [facility]  Date of Exam:   11/01/2023 Medical Rec #: [id number]           Accession #:    [id number] Date of Birth: [birth year]           Patient Gender: M Patient Age:   28 years Exam Location:  [facility] Procedure:      VAS US  ABI WITH/WO TBI Referring Phys: SELINDA DEW --------------------------------------------------------------------------------  Indications: Peripheral artery  disease. High Risk         Hypertension, current smoker, prior MI, coronary artery Factors:          disease.  Vascular Interventions: 10/01/2023 Right EIA, CFA, SFA thrombectomy . RIght EIA/                         CFA stent                          01/12/23: Right EIA & CFA stents;. Performing Technologist: Donnice Charnley RVT  Examination Guidelines: A complete evaluation includes at minimum, Doppler waveform signals and systolic blood pressure reading at the level of bilateral brachial, anterior tibial, and posterior tibial arteries, when vessel segments are accessible. Bilateral testing is considered an integral part of a complete examination. Photoelectric  Plethysmograph (PPG) waveforms and toe systolic pressure readings are included as required and additional duplex testing as needed. Limited examinations for reoccurring indications may be performed as noted.  ABI Findings: +---------+------------------+-----+---------+--------+ Right    Rt Pressure (mmHg)IndexWaveform Comment  +---------+------------------+-----+---------+--------+ Brachial 137                                      +---------+------------------+-----+---------+--------+ PTA      151               1.09 triphasic         +---------+------------------+-----+---------+--------+ DP       143               1.03 triphasic         +---------+------------------+-----+---------+--------+ Great Toe124               0.89                   +---------+------------------+-----+---------+--------+ +---------+------------------+-----+----------+-------+ Left     Lt Pressure (mmHg)IndexWaveform  Comment +---------+------------------+-----+----------+-------+ Brachial 139                                      +---------+------------------+-----+----------+-------+ PTA      131               0.94 triphasic         +---------+------------------+-----+----------+-------+ DP       139               1.00 monophasic        +---------+------------------+-----+----------+-------+ Great Toe94                0.68                   +---------+------------------+-----+----------+-------+ +-------+-----------+-----------+------------+------------+ ABI/TBIToday's ABIToday's TBIPrevious ABIPrevious TBI +-------+-----------+-----------+------------+------------+ Right  1.09       0.89       1.14        0.97         +-------+-----------+-----------+------------+------------+ Left   1.00       0.68       1.11        0.94         +-------+-----------+-----------+------------+------------+  Bilateral ABIs appear essentially unchanged compared to prior study on  08/28/2023.  Summary: Right: Resting right ankle-brachial index is within normal range. The right toe-brachial index is normal. Left: Resting left ankle-brachial index is within normal  range. The left toe-brachial index is abnormal. *See table(s) above for measurements and observations.  Electronically signed by Cordella Shawl MD on 11/05/2023 at 8:35:27 AM.    Final        Assessment & Plan:   1. Atherosclerosis of native artery of right lower extremity with rest pain (HCC) (Primary) Recommend: The patient is concerned that the symptoms have not changed in fact seem to have worsened since his recent angiogram. The patient has evidence of severe atherosclerotic changes of both lower extremities with rest pain that is associated with preulcerative changes and impending tissue loss of the right foot.  This represents a limb threatening ischemia and places the patient at the risk for right limb loss.  Patient should undergo angiography of the right lower extremity with the hope for intervention for limb salvage.  The risks and benefits as well as the alternative therapies was discussed in detail with the patient.  All questions were answered.  Patient agrees to proceed with right lower extremity angiography.  The patient will follow up with me in the office after the procedure.      2. Essential hypertension Continue antihypertensive medications as already ordered, these medications have been reviewed and there are no changes at this time.  3. Smoker Smoking cessation was discussed, 3-10 minutes spent on this topic specifically   Current Outpatient Medications on File Prior to Visit  Medication Sig Dispense Refill   apixaban  (ELIQUIS ) 5 MG TABS tablet Take 1 tablet (5 mg total) by mouth 2 (two) times daily. 60 tablet 3   atorvastatin  (LIPITOR ) 80 MG tablet Take 1 tablet (80 mg total) by mouth daily at 6 PM. 90 tablet 3   clopidogrel  (PLAVIX ) 75 MG tablet Take 1 tablet (75 mg total) by mouth  daily. 30 tablet 11   ezetimibe  (ZETIA ) 10 MG tablet Take 1 tablet (10 mg total) by mouth daily. 90 tablet 3   isosorbide  mononitrate (IMDUR ) 30 MG 24 hr tablet Take 1 tablet (30 mg total) by mouth 2 (two) times daily. 180 tablet 3   [Paused] metoprolol  succinate (TOPROL  XL) 25 MG 24 hr tablet Take 1 tablet (25 mg total) by mouth daily. 90 tablet 3   nicotine  polacrilex (NICOTINE  MINI) 2 MG lozenge Take 1 lozenge (2 mg total) by mouth every 2 (two) hours as needed. 72 lozenge 3   pantoprazole  (PROTONIX ) 20 MG tablet Take 1 tablet (20 mg total) by mouth daily. 90 tablet 3   No current facility-administered medications on file prior to visit.    There are no Patient Instructions on file for this visit. No follow-ups on file.   Nima Kemppainen E Shaarav Ripple, NP

## 2023-12-10 NOTE — Progress Notes (Signed)
 Subjective:    Patient ID: Aaron Knox, male    DOB: March 03, 1956, 68 y.o.   MRN: [id number] Chief Complaint  Patient presents with   Follow-up     Follow up with Aaron Gwendlyn SAUNDERS, NP (Vascular Surgery) in 4 weeks (12/13/2023); ABIs.       The patient returns to the office for followup and review status post angiogram with intervention on 11/15/2023.   Procedure: Procedure(s) Performed:             1.  Ultrasound guidance for vascular access left femoral artery             2.  Catheter placement into right common femoral artery from left femoral approach             3.  Aortogram and selective right lower extremity angiogram             4.  Stent placement to the right SFA with 6 mm diameter by 20 cm length life stent             5.  Celt closure device left femoral artery   The patient notes no improvement in his lower extremity symptoms.  He notes that he has significant pain especially during the evening.  There have been no significant changes to the patient's overall health care.  No documented history of amaurosis fugax or recent TIA symptoms. There are no recent neurological changes noted. No documented history of DVT, PE or superficial thrombophlebitis. The patient denies recent episodes of angina or shortness of breath.   ABI's Rt=1.02 and Lt=1.14  (previous ABI's Rt=1.09 and Lt=1.00) Duplex US  of the bilateral lower extremity shows multiphasic waveforms    Review of Systems  Cardiovascular:        Rest pain  All other systems reviewed and are negative.      Objective:   Physical Exam Vitals reviewed.  HENT:     Head: Normocephalic.  Cardiovascular:     Rate and Rhythm: Normal rate.     Pulses:          Dorsalis pedis pulses are detected w/ Doppler on the right side and detected w/ Doppler on the left side.       Posterior tibial pulses are detected w/ Doppler on the right side and detected w/ Doppler on the left side.  Pulmonary:     Effort: Pulmonary  effort is normal.  Skin:    General: Skin is warm and dry.  Neurological:     Mental Status: He is alert and oriented to person, place, and time.  Psychiatric:        Mood and Affect: Mood normal.        Behavior: Behavior normal.        Thought Content: Thought content normal.        Judgment: Judgment normal.     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)    CAD in native artery    a.) LHC/PCI 08/09/2014 (setting of STEMI): 30% mLAD, 99% pLCx (3.0 x 15 mm Resolute Integrity DES x 1 with 10% residual post-intervention stenosis), 45% mLCx, 20% pRCA, 40% dRCA); b.) LHC 01/29/2018: 30% mLAD, 45% mLCx, 50% pRCA, 80% dRCA --> med mgmt   CHF (  congestive heart failure) (HCC)    DDD (degenerative disc disease), lumbar    Diastolic dysfunction    a.) TTE 08/11/2014 (s/p STEMI): EF 45-50%, inf HK, AoV sclerosis, G1DD; b.) TTE 02/28/2018: EF 55-60%, G1DD   History of bilateral cataract extraction 2023   HTN (hypertension)    Hyperlipidemia    Long term current use of antithrombotics/antiplatelets    a.) clopidogrel    ST elevation myocardial infarction (STEMI) of true posterior wall (HCC) 08/09/2014   a.) LHC/PCI 08/09/2014: 3.0 x 15 mm Resolute Integrity DES x 1 pLCx with 10% residual post-intervention stenosis)   Unstable angina (HCC)     Social History   Socioeconomic History   Marital status: Single    Spouse name: Not on file   Number of children: 1   Years of education: Not on file   Highest education level: Not on file  Occupational History   Not on file  Tobacco Use   Smoking status: Some Days    Current packs/day: 0.00    Average packs/day: 1.5 packs/day for 56.3 years (84.5 ttl pk-yrs)    Types: Cigarettes    Start date: 1968    Last attempt to quit: 08/2022    Years since quitting: 1.3   Smokeless tobacco: Never    Tobacco comments:    Smoked 1.5 ppd at his heaviest 09/12/23  Vaping Use   Vaping status: Every Day   Substances: Nicotine   Substance and Sexual Activity   Alcohol use: Yes    Alcohol/week: 3.0 standard drinks of alcohol    Types: 3 Glasses of wine per week    Comment: weekends   Drug use: No   Sexual activity: Not on file  Other Topics Concern   Not on file  Social History Narrative   Has fiance   Social Drivers of Health   Financial Resource Strain: Not on file  Food Insecurity: No Food Insecurity (11/14/2023)   Hunger Vital Sign    Worried About Running Out of Food in the Last Year: Never true    Ran Out of Food in the Last Year: Never true  Transportation Needs: No Transportation Needs (11/14/2023)   PRAPARE - Administrator, Civil Service (Medical): No    Lack of Transportation (Non-Medical): No  Physical Activity: Not on file  Stress: Not on file  Social Connections: Unknown (11/14/2023)   Social Connection and Isolation Panel    Frequency of Communication with Friends and Family: More than three times a week    Frequency of Social Gatherings with Friends and Family: More than three times a week    Attends Religious Services: Patient declined    Active Member of Clubs or Organizations: Patient declined    Attends Banker Meetings: Patient declined    Marital Status: Living with partner  Intimate Partner Violence: Not At Risk (11/14/2023)   Humiliation, Afraid, Rape, and Kick questionnaire    Fear of Current or Ex-Partner: No    Emotionally Abused: No    Physically Abused: No    Sexually Abused: No    Past Surgical History:  Procedure Laterality Date   CARDIAC CATHETERIZATION N/A 08/09/2014   Procedure: Left Heart Cath and Coronary Angiography;  Surgeon: Deatrice DELENA Cage, MD;  Location: [facility];  Service: Cardiovascular;  Laterality: N/A;   CARDIAC CATHETERIZATION N/A 08/09/2014   Procedure: Coronary Stent Intervention;  Surgeon:  Deatrice DELENA Cage, MD;  Location: [facility];  Service: Cardiovascular;  Laterality:  N/A;   CATARACT EXTRACTION W/ INTRAOCULAR LENS  IMPLANT, BILATERAL Bilateral 2023   CORONARY ANGIOPLASTY  08/09/2014   ENDARTERECTOMY FEMORAL Right 09/20/2022   Procedure: ENDARTERECTOMY FEMORAL;  Surgeon: Marea Selinda RAMAN, MD;  Location: [facility];  Service: Vascular;  Laterality: Right;   HAND SURGERY Left 1980   bone chip with nerve damage   INSERTION OF ILIAC STENT Right 09/20/2022   Procedure: INSERTION OF ILIAC STENT;  Surgeon: Marea Selinda RAMAN, MD;  Location: [facility];  Service: Vascular;  Laterality: Right;   LEFT HEART CATH AND CORONARY ANGIOGRAPHY N/A 01/29/2018   Procedure: LEFT HEART CATH AND CORONARY ANGIOGRAPHY;  Surgeon: Mady Bruckner, MD;  Location: [facility];  Service: Cardiovascular;  Laterality: N/A;   LOWER EXTREMITY ANGIOGRAPHY Right 08/07/2022   Procedure: Lower Extremity Angiography;  Surgeon: Marea Selinda RAMAN, MD;  Location: [facility];  Service: Cardiovascular;  Laterality: Right;   LOWER EXTREMITY ANGIOGRAPHY Right 01/12/2023   Procedure: Lower Extremity Angiography;  Surgeon: Marea Selinda RAMAN, MD;  Location: [facility];  Service: Cardiovascular;  Laterality: Right;   LOWER EXTREMITY ANGIOGRAPHY Right 10/01/2023   Procedure: Lower Extremity Angiography;  Surgeon: Marea Selinda RAMAN, MD;  Location: [facility];  Service: Cardiovascular;  Laterality: Right;   LOWER EXTREMITY ANGIOGRAPHY Right 11/15/2023   Procedure: Lower Extremity Angiography;  Surgeon: Marea Selinda RAMAN, MD;  Location: [facility];  Service: Cardiovascular;  Laterality: Right;   nerve reconstruction Left 1980   hand    Family History  Problem Relation Age of Onset   CAD Maternal Grandfather     Allergies  Allergen Reactions   Aspirin  Anaphylaxis   Penicillins Other (See Comments)    Childhood allergy per pt's mother. He does not recall any details of the reaction. Willing to try beta  lactams.       Latest Ref Rng & Units 11/16/2023    1:30 PM 11/13/2023    4:29 PM 10/08/2023    8:49 AM  CBC  WBC 4.0 - 10.5 K/uL 6.1  4.6  4.9   Hemoglobin 13.0 - 17.0 g/dL 87.0  87.8  86.3   Hematocrit 39.0 - 52.0 % 38.4  35.6  40.4   Platelets 150 - 400 K/uL 192  181  205       CMP     Component Value Date/Time   NA 142 11/16/2023 1330   K 3.8 11/16/2023 1330   CL 107 11/16/2023 1330   CO2 26 11/16/2023 1330   GLUCOSE 106 (H) 11/16/2023 1330   BUN 10 11/16/2023 1330   CREATININE 0.94 11/16/2023 1330   CALCIUM  9.6 11/16/2023 1330   PROT 6.1 (L) 09/30/2023 0527   ALBUMIN 3.5 09/30/2023 0527   AST 20 09/30/2023 0527   ALT 21 09/30/2023 0527   ALKPHOS 72 09/30/2023 0527   BILITOT 0.7 09/30/2023 0527   GFRNONAA >60 11/16/2023 1330     VAS US  ABI WITH/WO TBI Result Date: 11/05/2023  LOWER EXTREMITY DOPPLER STUDY Patient Name:  Jasiri [facility]alt [facility]  Date of Exam:   11/01/2023 Medical Rec #: [id number]           Accession #:    [id number] Date of Birth: [birth year]           Patient Gender: M Patient Age:   28 years Exam Location:  [facility] Procedure:      VAS US  ABI WITH/WO TBI Referring Phys: SELINDA DEW --------------------------------------------------------------------------------  Indications: Peripheral artery  disease. High Risk         Hypertension, current smoker, prior MI, coronary artery Factors:          disease.  Vascular Interventions: 10/01/2023 Right EIA, CFA, SFA thrombectomy . RIght EIA/                         CFA stent                          01/12/23: Right EIA & CFA stents;. Performing Technologist: Donnice Charnley RVT  Examination Guidelines: A complete evaluation includes at minimum, Doppler waveform signals and systolic blood pressure reading at the level of bilateral brachial, anterior tibial, and posterior tibial arteries, when vessel segments are accessible. Bilateral testing is considered an integral part of a complete examination. Photoelectric  Plethysmograph (PPG) waveforms and toe systolic pressure readings are included as required and additional duplex testing as needed. Limited examinations for reoccurring indications may be performed as noted.  ABI Findings: +---------+------------------+-----+---------+--------+ Right    Rt Pressure (mmHg)IndexWaveform Comment  +---------+------------------+-----+---------+--------+ Brachial 137                                      +---------+------------------+-----+---------+--------+ PTA      151               1.09 triphasic         +---------+------------------+-----+---------+--------+ DP       143               1.03 triphasic         +---------+------------------+-----+---------+--------+ Great Toe124               0.89                   +---------+------------------+-----+---------+--------+ +---------+------------------+-----+----------+-------+ Left     Lt Pressure (mmHg)IndexWaveform  Comment +---------+------------------+-----+----------+-------+ Brachial 139                                      +---------+------------------+-----+----------+-------+ PTA      131               0.94 triphasic         +---------+------------------+-----+----------+-------+ DP       139               1.00 monophasic        +---------+------------------+-----+----------+-------+ Great Toe94                0.68                   +---------+------------------+-----+----------+-------+ +-------+-----------+-----------+------------+------------+ ABI/TBIToday's ABIToday's TBIPrevious ABIPrevious TBI +-------+-----------+-----------+------------+------------+ Right  1.09       0.89       1.14        0.97         +-------+-----------+-----------+------------+------------+ Left   1.00       0.68       1.11        0.94         +-------+-----------+-----------+------------+------------+  Bilateral ABIs appear essentially unchanged compared to prior study on  08/28/2023.  Summary: Right: Resting right ankle-brachial index is within normal range. The right toe-brachial index is normal. Left: Resting left ankle-brachial index is within normal  range. The left toe-brachial index is abnormal. *See table(s) above for measurements and observations.  Electronically signed by Cordella Shawl MD on 11/05/2023 at 8:35:27 AM.    Final        Assessment & Plan:   1. Atherosclerosis of native artery of right lower extremity with rest pain (HCC) (Primary) Recommend: The patient is concerned that the symptoms have not changed in fact seem to have worsened since his recent angiogram. The patient has evidence of severe atherosclerotic changes of both lower extremities with rest pain that is associated with preulcerative changes and impending tissue loss of the right foot.  This represents a limb threatening ischemia and places the patient at the risk for right limb loss.  Patient should undergo angiography of the right lower extremity with the hope for intervention for limb salvage.  The risks and benefits as well as the alternative therapies was discussed in detail with the patient.  All questions were answered.  Patient agrees to proceed with right lower extremity angiography.  The patient will follow up with me in the office after the procedure.      2. Essential hypertension Continue antihypertensive medications as already ordered, these medications have been reviewed and there are no changes at this time.  3. Smoker Smoking cessation was discussed, 3-10 minutes spent on this topic specifically   Current Outpatient Medications on File Prior to Visit  Medication Sig Dispense Refill   apixaban  (ELIQUIS ) 5 MG TABS tablet Take 1 tablet (5 mg total) by mouth 2 (two) times daily. 60 tablet 3   atorvastatin  (LIPITOR ) 80 MG tablet Take 1 tablet (80 mg total) by mouth daily at 6 PM. 90 tablet 3   clopidogrel  (PLAVIX ) 75 MG tablet Take 1 tablet (75 mg total) by mouth  daily. 30 tablet 11   ezetimibe  (ZETIA ) 10 MG tablet Take 1 tablet (10 mg total) by mouth daily. 90 tablet 3   isosorbide  mononitrate (IMDUR ) 30 MG 24 hr tablet Take 1 tablet (30 mg total) by mouth 2 (two) times daily. 180 tablet 3   [Paused] metoprolol  succinate (TOPROL  XL) 25 MG 24 hr tablet Take 1 tablet (25 mg total) by mouth daily. 90 tablet 3   nicotine  polacrilex (NICOTINE  MINI) 2 MG lozenge Take 1 lozenge (2 mg total) by mouth every 2 (two) hours as needed. 72 lozenge 3   pantoprazole  (PROTONIX ) 20 MG tablet Take 1 tablet (20 mg total) by mouth daily. 90 tablet 3   No current facility-administered medications on file prior to visit.    There are no Patient Instructions on file for this visit. No follow-ups on file.   Nima Kemppainen E Shaarav Ripple, NP

## 2023-12-11 LAB — VAS US ABI WITH/WO TBI
Left ABI: 1.14
Right ABI: 1.02

## 2023-12-18 ENCOUNTER — Telehealth (INDEPENDENT_AMBULATORY_CARE_PROVIDER_SITE_OTHER): Payer: Self-pay

## 2023-12-18 NOTE — Telephone Encounter (Signed)
 Spoke with the patient and he is scheduled with Dr. Marea for a RLE angio with a 11:00 am arrival time to the [facility]. Pre-procedure instructions were discussed and will be sent to Mychart and mailed.

## 2023-12-27 DIAGNOSIS — I70229 Atherosclerosis of native arteries of extremities with rest pain, unspecified extremity: Secondary | ICD-10-CM

## 2023-12-27 NOTE — Progress Notes (Signed)
 Contacted patient and confirmed lower extremity angiogram reschedule date and time for Monday 9/22 for 9:30 arrival time will be 8:30 NPO after MN

## 2023-12-31 ENCOUNTER — Other Ambulatory Visit: Payer: Self-pay

## 2023-12-31 ENCOUNTER — Encounter
Admission: RE | Disposition: A | Discharge status: Home / Self Care | Payer: Self-pay | Source: Home / Self Care | Attending: Vascular Surgery

## 2023-12-31 ENCOUNTER — Encounter: Payer: Self-pay | Admitting: Vascular Surgery

## 2023-12-31 ENCOUNTER — Ambulatory Visit
Admission: RE | Admit: 2023-12-31 | Discharge: 2023-12-31 | Disposition: A | Discharge status: Home / Self Care | Attending: Vascular Surgery | Admitting: Vascular Surgery

## 2023-12-31 DIAGNOSIS — I509 Heart failure, unspecified: Secondary | ICD-10-CM | POA: Insufficient documentation

## 2023-12-31 DIAGNOSIS — I110 Hypertensive heart disease with heart failure: Secondary | ICD-10-CM | POA: Diagnosis not present

## 2023-12-31 DIAGNOSIS — Z95828 Presence of other vascular implants and grafts: Secondary | ICD-10-CM

## 2023-12-31 DIAGNOSIS — I723 Aneurysm of iliac artery: Secondary | ICD-10-CM

## 2023-12-31 DIAGNOSIS — Z79899 Other long term (current) drug therapy: Secondary | ICD-10-CM | POA: Diagnosis not present

## 2023-12-31 DIAGNOSIS — I70221 Atherosclerosis of native arteries of extremities with rest pain, right leg: Secondary | ICD-10-CM

## 2023-12-31 DIAGNOSIS — F17210 Nicotine dependence, cigarettes, uncomplicated: Secondary | ICD-10-CM | POA: Diagnosis not present

## 2023-12-31 DIAGNOSIS — I700 Atherosclerosis of aorta: Secondary | ICD-10-CM

## 2023-12-31 DIAGNOSIS — Z9889 Other specified postprocedural states: Secondary | ICD-10-CM

## 2023-12-31 DIAGNOSIS — I70229 Atherosclerosis of native arteries of extremities with rest pain, unspecified extremity: Secondary | ICD-10-CM

## 2023-12-31 LAB — BUN: BUN: 11 mg/dL (ref 8–23)

## 2023-12-31 LAB — CREATININE, SERUM
Creatinine, Ser: 0.86 mg/dL (ref 0.61–1.24)
GFR, Estimated: >60 mL/min (ref >60)

## 2023-12-31 SURGERY — LOWER EXTREMITY ANGIOGRAPHY
Anesthesia: Moderate Sedation | Site: Leg Lower | Laterality: Right

## 2023-12-31 MED ORDER — MIDAZOLAM HCL 2 MG/2ML IJ SOLN
INTRAMUSCULAR | Status: AC
  Filled 2023-12-31: qty 4

## 2023-12-31 MED ORDER — DIPHENHYDRAMINE HCL 50 MG/ML IJ SOLN: 50.0000 mg | Freq: Once | INTRAMUSCULAR | Status: DC | PRN

## 2023-12-31 MED ORDER — METHYLPREDNISOLONE SODIUM SUCC 125 MG IJ SOLR: 125.0000 mg | Freq: Once | INTRAMUSCULAR | Status: DC | PRN

## 2023-12-31 MED ORDER — CEFAZOLIN SODIUM-DEXTROSE 2-4 GM/100ML-% IV SOLN
2.0000 g | INTRAVENOUS | Status: AC
  Administered 2023-12-31: 2 g via INTRAVENOUS

## 2023-12-31 MED ORDER — CEFAZOLIN SODIUM-DEXTROSE 2-4 GM/100ML-% IV SOLN
INTRAVENOUS | Status: AC
  Filled 2023-12-31: qty 100

## 2023-12-31 MED ORDER — HYDROMORPHONE HCL 1 MG/ML IJ SOLN: 1.0000 mg | Freq: Once | INTRAMUSCULAR | Status: DC | PRN

## 2023-12-31 MED ORDER — IODIXANOL 320 MG/ML IV SOLN
INTRAVENOUS | Status: DC | PRN
  Administered 2023-12-31: 50 mL

## 2023-12-31 MED ORDER — MIDAZOLAM HCL 2 MG/2ML IJ SOLN
INTRAMUSCULAR | Status: DC | PRN
  Administered 2023-12-31: 1 mg via INTRAVENOUS
  Administered 2023-12-31: 2 mg via INTRAVENOUS

## 2023-12-31 MED ORDER — LIDOCAINE-EPINEPHRINE (PF) 1 %-1:200000 IJ SOLN
INTRAMUSCULAR | Status: DC | PRN
  Administered 2023-12-31: 10 mL

## 2023-12-31 MED ORDER — ONDANSETRON HCL 4 MG/2ML IJ SOLN: 4.0000 mg | Freq: Four times a day (QID) | INTRAMUSCULAR | Status: DC | PRN

## 2023-12-31 MED ORDER — FAMOTIDINE 20 MG PO TABS: 40.0000 mg | ORAL_TABLET | Freq: Once | ORAL | Status: DC | PRN

## 2023-12-31 MED ORDER — MIDAZOLAM HCL 2 MG/ML PO SYRP: 8.0000 mg | ORAL_SOLUTION | Freq: Once | ORAL | Status: DC | PRN

## 2023-12-31 MED ORDER — FENTANYL CITRATE (PF) 100 MCG/2ML IJ SOLN
INTRAMUSCULAR | Status: DC | PRN
  Administered 2023-12-31: 50 ug via INTRAVENOUS
  Administered 2023-12-31: 25 ug via INTRAVENOUS

## 2023-12-31 MED ORDER — SODIUM CHLORIDE 0.9 % IV SOLN: INTRAVENOUS | Status: DC

## 2023-12-31 MED ORDER — HEPARIN (PORCINE) IN NACL 1000-0.9 UT/500ML-% IV SOLN
INTRAVENOUS | Status: DC | PRN
  Administered 2023-12-31: 1000 mL

## 2023-12-31 MED ORDER — HEPARIN SODIUM (PORCINE) 1000 UNIT/ML IJ SOLN
INTRAMUSCULAR | Status: AC
  Filled 2023-12-31: qty 10

## 2023-12-31 MED ORDER — FENTANYL CITRATE (PF) 100 MCG/2ML IJ SOLN
INTRAMUSCULAR | Status: AC
  Filled 2023-12-31: qty 2

## 2023-12-31 SURGICAL SUPPLY — 10 items
CATH ANGIO 5F PIGTAIL 65CM (CATHETERS) IMPLANT
COVER PROBE ULTRASOUND 5X96 (MISCELLANEOUS) IMPLANT
DEVICE VASC CLSR CELT ART 6 (Vascular Products) IMPLANT
GLIDEWIRE ADV .035X260CM (WIRE) IMPLANT
PACK ANGIOGRAPHY (CUSTOM PROCEDURE TRAY) ×1 IMPLANT
SHEATH BRITE TIP 5FRX11 (SHEATH) IMPLANT
SHEATH BRITE TIP 6FRX11 (SHEATH) IMPLANT
SYR MEDRAD MARK 7 150ML (SYRINGE) IMPLANT
TUBING CONTRAST HIGH PRESS 72 (TUBING) IMPLANT
WIRE J 3MM .035X145CM (WIRE) IMPLANT

## 2023-12-31 NOTE — Op Note (Signed)
 [facility]  Percutaneous Study/Intervention Procedural Note   Date of Surgery: 12/31/2023  Surgeon(s):Kareemah Grounds    Assistants:none  Pre-operative Diagnosis: PAD with rest pain right lower extremity  Post-operative diagnosis:  Same  Procedure(s) Performed:             1.  Ultrasound guidance for vascular access left femoral artery             2.  Catheter placement into right SFA from left femoral approach             3.  Aortogram and selective right lower extremity angiogram             4.  Celt closure device left femoral artery  EBL: 3 cc  Contrast: 1.2 cc  Fluoro Time: 50 minutes  Moderate Conscious Sedation Time: approximately 23 minutes using 3 mg of Versed  and 75 mcg of Fentanyl               Indications:  Patient is a 68 y.o.male with a long history of PAD and multiple previous interventions.  He has severe right leg pain concerning for recurrent ischemia.  The patient is brought in for angiography for further evaluation and potential treatment.  Risks and benefits are discussed and informed consent is obtained.   Procedure:  The patient was identified and appropriate procedural time out was performed.  The patient was then placed supine on the table and prepped and draped in the usual sterile fashion. Moderate conscious sedation was administered during a face to face encounter with the patient throughout the procedure with my supervision of the RN administering medicines and monitoring the patient's vital signs, pulse oximetry, telemetry and mental status throughout from the start of the procedure until the patient was taken to the recovery room. Ultrasound was used to evaluate the left common femoral artery.  It was patent .  A digital ultrasound image was acquired.  A Seldinger needle was used to access the left common femoral artery under direct ultrasound guidance and a permanent image was performed.  A 0.035 J wire was advanced without resistance  and a 5Fr sheath was placed.  Pigtail catheter was placed into the aorta and an AP aortogram was performed. This demonstrated normal renal arteries and an aorta was somewhat irregular with mild disease in the distal segment that did not appear at all flow-limiting.  The right iliac system had been extensively stented but this was all now widely patent down to the right common femoral artery stents.  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 I then crossed the aortic bifurcation and advanced to the right femoral head and then into the right SFA to opacify distally. Selective right lower extremity angiogram was then performed. This demonstrated the stents in the right common femoral artery were patent.  The profunda femoris artery was small but not diseased.  The SFA and popliteal stents were all patent without significant recurrent stenosis.  The popliteal artery below the previous stents were patent.  There was a normal tibial trifurcation and then both the anterior tibial and posterior tibial arteries were patent without significant stenosis and continuous distally into the foot.  There was no role for revascularization with preserve blood flow at this point.  The only real abnormality was the mild distal aortic irregularity that did not appear flow-limiting. I elected to terminate the procedure. The sheath was removed  and Celt closure device was deployed in the left femoral artery with excellent hemostatic result. The patient was taken to the recovery room in stable condition having tolerated the procedure well.  Findings:               Aortogram:  This demonstrated normal renal arteries and an aorta was somewhat irregular with mild disease in the distal segment that did not appear at all flow-limiting.  The right iliac system had been extensively stented but this was all now widely patent down to the right common  femoral artery stents.  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             Right lower Extremity:  This demonstrated the stents in the right common femoral artery were patent.  The profunda femoris artery was small but not diseased.  The SFA and popliteal stents were all patent without significant recurrent stenosis.  The popliteal artery below the previous stents were patent.  There was a normal tibial trifurcation and then both the anterior tibial and posterior tibial arteries were patent without significant stenosis and continuous distally into the foot.   Disposition: Patient was taken to the recovery room in stable condition having tolerated the procedure well.  Complications: None  Selinda Gu 12/31/2023 9:51 AM   This note was created with Dragon Medical transcription system. Any errors in dictation are purely unintentional.

## 2023-12-31 NOTE — Interval H&P Note (Signed)
 History and Physical Interval Note:  12/31/2023 8:37 AM  Aaron Knox  has presented today for surgery, with the diagnosis of RLE Angio    ASO w rest pain.  The various methods of treatment have been discussed with the patient and family. After consideration of risks, benefits and other options for treatment, the patient has consented to  Procedure(s): Lower Extremity Angiography (Right) as a surgical intervention.  The patient's history has been reviewed, patient examined, no change in status, stable for surgery.  I have reviewed the patient's chart and labs.  Questions were answered to the patient's satisfaction.     Erol Flanagin

## 2023-12-31 NOTE — Progress Notes (Signed)
 Dr. Marea at bedside, speaking with pt. And his S.O. Barnie re: angiogram. Pt. And S.O. verbalized understanding of conversation with MD.

## 2024-01-01 ENCOUNTER — Encounter: Payer: Self-pay | Admitting: Vascular Surgery

## 2024-01-18 ENCOUNTER — Encounter

## 2024-01-18 ENCOUNTER — Ambulatory Visit: Admitting: Pulmonary Disease

## 2024-01-30 ENCOUNTER — Telehealth: Payer: Self-pay | Admitting: Cardiovascular Disease

## 2024-01-30 ENCOUNTER — Other Ambulatory Visit: Payer: Self-pay | Admitting: *Deleted

## 2024-01-30 MED ORDER — ISOSORBIDE MONONITRATE ER 30 MG PO TB24: 30.0000 mg | ORAL_TABLET | Freq: Two times a day (BID) | ORAL | 3 refills | Status: AC

## 2024-01-30 NOTE — Telephone Encounter (Signed)
 Pt c/o medication issue:  1. Name of Medication:   isosorbide  mononitrate (IMDUR ) 30 MG 24 hr tablet    2. How are you currently taking this medication (dosage and times per day)? As written  3. Are you having a reaction (difficulty breathing--STAT)? No   4. What is your medication issue? Pt is needing a refill but is questioning if he needs to stay on the medication. Please advise. Pt uses   [facility] - [place], [place] - [address] Phone: [phone]  Fax: [phone]

## 2024-01-30 NOTE — Telephone Encounter (Signed)
 Spoke with the patient to clarify that he still needs to continue taking Imdur  30 mg twice daily. The patient was confused, thinking he needed to stop Imdur  due to the discontinuation of Metoprolol . I explained that his refill request was rejected because he had requested a prescription for Imdur  30 mg once daily, which had been discontinued. The correct regimen remains Imdur  30 mg twice daily.  Pt also had his fiance get on the phone for me to explain to her what medication he needed to be taking A prescription refill was sent to [facility] for Imdur  30 mg by mouth twice daily..  Both pt and fiance verbalized understanding.

## 2024-02-06 NOTE — Progress Notes (Deleted)
 Electrophysiology Clinic Note    Date:  02/06/2024  Patient ID:  Aaron, Knox 17-Oct-1955, MRN [id number] PCP:  Pcp, No  Cardiologist:  Aaron Gollan, MD  Electrophysiologist:  Aaron ONEIDA HOLTS, MD    ***refresh  Discussed the use of AI scribe software for clinical note transcription with the patient, who gave verbal consent to proceed.   Patient Profile    Chief Complaint: ***  History of Present Illness: Aaron Knox is a 68 y.o. male with PMH notable for SVT, PAC, CAD s/p PCI (2016), HLD, PVD s/p femoral endarterectomy & stenting, tobacco use; seen today for Aaron ONEIDA HOLTS, MD for routine electrophysiology followup.   He last saw Dr. Holts 10/2023 for initial EP evaluation of SVT.  At that time Dr. Holts favored conservative management and Toprol  25 mg daily was started.    On follow-up today,  ***Palpitations  Since last being seen in our clinic the patient reports doing ***.  he denies chest pain, palpitations, dyspnea, PND, orthopnea, nausea, vomiting, dizziness, syncope, edema, weight gain, or early satiety.      Arrhythmia/Device History No specialty comments available.    ROS:  Please see the history of present illness. All other systems are reviewed and otherwise negative.    Physical Exam    VS:  There were no vitals taken for this visit. BMI: There is no height or weight on file to calculate BMI.           Wt Readings from Last 3 Encounters:  12/31/23 187 lb (84.8 kg)  12/05/23 194 lb 12.8 oz (88.4 kg)  11/16/23 193 lb 12.6 oz (87.9 kg)     GEN- The patient is well appearing, alert and oriented x 3 today.   Lungs- Clear to ausculation bilaterally, normal work of breathing.  Heart- {Blank single:19197::Regular,Irregularly irregular} rate and rhythm, no murmurs, rubs or gallops Extremities- {EDEMA LEVEL:28147::No} peripheral edema, warm, dry   Studies Reviewed   Previous EP, cardiology notes.    EKG is  ordered. Personal review of EKG from today shows:  ***        TTE, 09/13/2023  1. Left ventricular ejection fraction, by estimation, is 55%. The left  ventricle has normal function. The left ventricle has no regional wall  motion abnormalities. Left ventricular diastolic parameters were normal.   2. Right ventricular systolic function is normal. The right ventricular  size is normal.   3. The mitral valve is normal in structure. No evidence of mitral valve  regurgitation.   4. The aortic valve is tricuspid. Aortic valve regurgitation is not  visualized. Aortic valve sclerosis/calcification is present, without any  evidence of aortic stenosis.   5. The inferior vena cava is normal in size with greater than 50%  respiratory variability, suggesting right atrial pressure of 3 mmHg.   Long term monitor, 09/04/2023 Patch Wear Time:  13 days and 23 hours (2025-05-01T08:33:12-398 to 2025-05-15T08:33:04-398)   Normal sinus rhythm Patient had a min HR of 41 bpm, max HR of 214 bpm, and avg HR of 70 bpm.   1 run of Ventricular Tachycardia occurred lasting 5 beats with a max rate of 214 bpm (avg 196 bpm).   172 Supraventricular Tachycardia runs occurred, the run with the fastest interval lasting 4 beats with a max rate of 200 bpm, the longest lasting 34.2 secs with an avg rate of 120 bpm.   Isolated SVEs were frequent (13.4%, 817001), SVE Couplets were occasional (1.9%, 13006), and  SVE Triplets were rare (<1.0%, 1162).    Isolated VEs were rare (<1.0%, 782), VE Couplets were rare (<1.0%, 6), and VE Triplets were rare (<1.0%, 2).    Patient triggered events (20) associated with normal sinus rhythm, and PACs  LHC, 01/29/2018 Significant single-vessel coronary artery disease, with 50% proximal and 80% distal RCA stenoses; RCA supplies relatively small PDA and PL branches. Mild to moderate, non-obstructive CAD involving the left coronary artery with widely patent stent in the proximal LCx. Normal  left ventricular filling pressure.   Assessment and Plan     #) SVT #) PAC   #) CAD   {Are you ordering a CV Procedure (e.g. stress test, cath, DCCV, TEE, etc)?   Press F2        :[id number]}   Current medicines are reviewed at length with the patient today.   The patient {ACTIONS; HAS/DOES NOT HAVE:19233} concerns regarding his medicines.  The following changes were made today:  {NONE DEFAULTED:18576}  Labs/ tests ordered today include: *** No orders of the defined types were placed in this encounter.    Disposition: Follow up with {EPMDS:28135::EP Team} or EP APP {EPFOLLOW UP:28173}   Signed, Aaron Needle, NP  02/06/24  3:25 PM  Electrophysiology [facility]

## 2024-02-07 ENCOUNTER — Ambulatory Visit: Attending: Cardiology | Admitting: Cardiology

## 2024-02-07 ENCOUNTER — Other Ambulatory Visit (INDEPENDENT_AMBULATORY_CARE_PROVIDER_SITE_OTHER): Payer: Self-pay | Admitting: Vascular Surgery

## 2024-02-07 DIAGNOSIS — I491 Atrial premature depolarization: Secondary | ICD-10-CM

## 2024-02-07 DIAGNOSIS — I723 Aneurysm of iliac artery: Secondary | ICD-10-CM

## 2024-02-07 DIAGNOSIS — I471 Supraventricular tachycardia, unspecified: Secondary | ICD-10-CM

## 2024-02-07 DIAGNOSIS — Z9889 Other specified postprocedural states: Secondary | ICD-10-CM

## 2024-02-07 DIAGNOSIS — R002 Palpitations: Secondary | ICD-10-CM

## 2024-02-11 ENCOUNTER — Ambulatory Visit (INDEPENDENT_AMBULATORY_CARE_PROVIDER_SITE_OTHER): Admitting: Nurse Practitioner

## 2024-02-11 ENCOUNTER — Ambulatory Visit (INDEPENDENT_AMBULATORY_CARE_PROVIDER_SITE_OTHER)

## 2024-02-11 ENCOUNTER — Encounter (INDEPENDENT_AMBULATORY_CARE_PROVIDER_SITE_OTHER): Payer: Self-pay | Admitting: Nurse Practitioner

## 2024-02-11 ENCOUNTER — Encounter: Payer: Self-pay | Admitting: Cardiology

## 2024-02-11 VITALS — BP 100/63 | HR 72 | Resp 18 | Ht 74.0 in | Wt 197.8 lb

## 2024-02-11 DIAGNOSIS — I723 Aneurysm of iliac artery: Secondary | ICD-10-CM | POA: Diagnosis not present

## 2024-02-11 DIAGNOSIS — I739 Peripheral vascular disease, unspecified: Secondary | ICD-10-CM | POA: Diagnosis not present

## 2024-02-11 DIAGNOSIS — I10 Essential (primary) hypertension: Secondary | ICD-10-CM | POA: Diagnosis not present

## 2024-02-11 DIAGNOSIS — E782 Mixed hyperlipidemia: Secondary | ICD-10-CM

## 2024-02-11 DIAGNOSIS — Z9889 Other specified postprocedural states: Secondary | ICD-10-CM

## 2024-02-11 MED ORDER — APIXABAN 5 MG PO TABS: 5.0000 mg | ORAL_TABLET | Freq: Two times a day (BID) | ORAL | 3 refills | Status: AC

## 2024-02-11 NOTE — Progress Notes (Signed)
 Subjective:    Patient ID: Aaron Knox, male    DOB: [birth year], 68 y.o.   MRN: [id number] Chief Complaint  Patient presents with   Follow-up    6 weeks with aortoiliac duplex and ABIs    The patient returns today following an angiogram on 12/31/2023.  This angiogram was done after his angio on 11/15/2023.  He noted that he had significant pain in the right lower extremity that was not resolved after his initial stenting.  He has angiogram on 12/31/2023 showed that the stents that were previously placed were widely patent through his iliac down to his tibial vessels.  He did not have any evidence of any significant stenosis of the right lower extremity.  He continues with his Plavix  aspirin  and Eliquis .  He notes that after his procedure when he went home the pain had resolved.  He notes that he has not had any pain recurrence since that time.  He denies any significant claudication symptoms or development of any rest pain or new open wounds or ulcerations.  Today he has an ABI of 1.13 on the right and 1.2 on the left.  He has strong triphasic waveforms and normal toe waveforms bilaterally with good TBI's.  Additionally his aortoiliac duplex showed that his stents are widely patent.  He has no evidence of an abdominal aortic aneurysm but his left common iliac artery is measuring 1.7 x 2.15 cm and this is consistent with the previous studies that were done on 02/27/2023.  The distal left common iliac artery does show greater than 50% stenosis.    Review of Systems  Cardiovascular:  Negative for leg swelling.  All other systems reviewed and are negative.      Objective:   Physical Exam Vitals reviewed.  HENT:     Head: Normocephalic.  Cardiovascular:     Rate and Rhythm: Normal rate.     Pulses:          Dorsalis pedis pulses are detected w/ Doppler on the right side and detected w/ Doppler on the left side.       Posterior tibial pulses are detected w/ Doppler on the right side and  detected w/ Doppler on the left side.  Pulmonary:     Effort: Pulmonary effort is normal.  Skin:    General: Skin is warm and dry.  Neurological:     Mental Status: He is alert and oriented to person, place, and time.  Psychiatric:        Mood and Affect: Mood normal.        Behavior: Behavior normal.        Thought Content: Thought content normal.        Judgment: Judgment normal.     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)    CAD in native artery    a.) LHC/PCI 08/09/2014 (setting of STEMI): 30% mLAD, 99% pLCx (3.0 x 15 mm Resolute Integrity DES x 1 with 10% residual post-intervention stenosis), 45% mLCx, 20% pRCA, 40% dRCA); b.) LHC 01/29/2018: 30% mLAD, 45% mLCx, 50% pRCA, 80% dRCA --> med mgmt   CHF (congestive heart failure) (HCC)    DDD (degenerative disc disease), lumbar    Diastolic dysfunction  a.) TTE 08/11/2014 (s/p STEMI): EF 45-50%, inf HK, AoV sclerosis, G1DD; b.) TTE 02/28/2018: EF 55-60%, G1DD   History of bilateral cataract extraction 2023   HTN (hypertension)    Hyperlipidemia    Long term current use of antithrombotics/antiplatelets    a.) clopidogrel    ST elevation myocardial infarction (STEMI) of true posterior wall (HCC) 08/09/2014   a.) LHC/PCI 08/09/2014: 3.0 x 15 mm Resolute Integrity DES x 1 pLCx with 10% residual post-intervention stenosis)   Unstable angina (HCC)     Social History   Socioeconomic History   Marital status: Single    Spouse name: Not on file   Number of children: 1   Years of education: Not on file   Highest education level: Not on file  Occupational History   Not on file  Tobacco Use   Smoking status: Some Days    Current packs/day: 0.00    Average packs/day: 1.5 packs/day for 56.3 years (84.5 ttl pk-yrs)    Types: Cigarettes    Start date: 1968    Last  attempt to quit: 08/2022    Years since quitting: 1.5   Smokeless tobacco: Never   Tobacco comments:    Smoked 1.5 ppd at his heaviest 09/12/22  Vaping Use   Vaping status: Former   Quit date: 10/30/2023  Substance and Sexual Activity   Alcohol use: Not Currently    Alcohol/week: 3.0 standard drinks of alcohol    Types: 3 Glasses of wine per week    Comment: last drink was Jan. 2025   Drug use: No   Sexual activity: Not on file  Other Topics Concern   Not on file  Social History Narrative   Has fiance   Social Drivers of Health   Financial Resource Strain: Not on file  Food Insecurity: No Food Insecurity (11/14/2023)   Hunger Vital Sign    Worried About Running Out of Food in the Last Year: Never true    Ran Out of Food in the Last Year: Never true  Transportation Needs: No Transportation Needs (11/14/2023)   PRAPARE - Administrator, Civil Service (Medical): No    Lack of Transportation (Non-Medical): No  Physical Activity: Not on file  Stress: Not on file  Social Connections: Unknown (11/14/2023)   Social Connection and Isolation Panel    Frequency of Communication with Friends and Family: More than three times a week    Frequency of Social Gatherings with Friends and Family: More than three times a week    Attends Religious Services: Patient declined    Active Member of Clubs or Organizations: Patient declined    Attends Banker Meetings: Patient declined    Marital Status: Living with partner  Intimate Partner Violence: Not At Risk (11/14/2023)   Humiliation, Afraid, Rape, and Kick questionnaire    Fear of Current or Ex-Partner: No    Emotionally Abused: No    Physically Abused: No    Sexually Abused: No    Past Surgical History:  Procedure Laterality Date   CARDIAC CATHETERIZATION N/A 08/09/2014   Procedure: Left Heart Cath and Coronary Angiography;  Surgeon: Deatrice DELENA Cage, MD;  Location: [facility];  Service: Cardiovascular;   Laterality: N/A;   CARDIAC CATHETERIZATION N/A 08/09/2014   Procedure: Coronary Stent Intervention;  Surgeon: Deatrice DELENA Cage, MD;  Location: [facility];  Service: Cardiovascular;  Laterality: N/A;   CATARACT EXTRACTION W/ INTRAOCULAR LENS  IMPLANT, BILATERAL Bilateral 2023  CORONARY ANGIOPLASTY  08/09/2014   ENDARTERECTOMY FEMORAL Right 09/20/2022   Procedure: ENDARTERECTOMY FEMORAL;  Surgeon: Marea Selinda RAMAN, MD;  Location: [facility];  Service: Vascular;  Laterality: Right;   HAND SURGERY Left 1980   bone chip with nerve damage   INSERTION OF ILIAC STENT Right 09/20/2022   Procedure: INSERTION OF ILIAC STENT;  Surgeon: Marea Selinda RAMAN, MD;  Location: [facility];  Service: Vascular;  Laterality: Right;   LEFT HEART CATH AND CORONARY ANGIOGRAPHY N/A 01/29/2018   Procedure: LEFT HEART CATH AND CORONARY ANGIOGRAPHY;  Surgeon: Mady Bruckner, MD;  Location: [facility];  Service: Cardiovascular;  Laterality: N/A;   LOWER EXTREMITY ANGIOGRAPHY Right 08/07/2022   Procedure: Lower Extremity Angiography;  Surgeon: Marea Selinda RAMAN, MD;  Location: [facility];  Service: Cardiovascular;  Laterality: Right;   LOWER EXTREMITY ANGIOGRAPHY Right 01/12/2023   Procedure: Lower Extremity Angiography;  Surgeon: Marea Selinda RAMAN, MD;  Location: [facility];  Service: Cardiovascular;  Laterality: Right;   LOWER EXTREMITY ANGIOGRAPHY Right 10/01/2023   Procedure: Lower Extremity Angiography;  Surgeon: Marea Selinda RAMAN, MD;  Location: [facility];  Service: Cardiovascular;  Laterality: Right;   LOWER EXTREMITY ANGIOGRAPHY Right 11/15/2023   Procedure: Lower Extremity Angiography;  Surgeon: Marea Selinda RAMAN, MD;  Location: [facility];  Service: Cardiovascular;  Laterality: Right;   LOWER EXTREMITY ANGIOGRAPHY Right 12/31/2023   Procedure: Lower Extremity Angiography;  Surgeon: Marea Selinda RAMAN, MD;  Location: [facility];  Service: Cardiovascular;  Laterality: Right;   nerve  reconstruction Left 1980   hand    Family History  Problem Relation Age of Onset   CAD Maternal Grandfather     Allergies  Allergen Reactions   Aspirin  Anaphylaxis   Penicillins Other (See Comments)    Childhood allergy per pt's mother. He does not recall any details of the reaction. Willing to try beta lactams.       Latest Ref Rng & Units 11/16/2023    1:30 PM 11/13/2023    4:29 PM 10/08/2023    8:49 AM  CBC  WBC 4.0 - 10.5 K/uL 6.1  4.6  4.9   Hemoglobin 13.0 - 17.0 g/dL 87.0  87.8  86.3   Hematocrit 39.0 - 52.0 % 38.4  35.6  40.4   Platelets 150 - 400 K/uL 192  181  205       CMP     Component Value Date/Time   NA 142 11/16/2023 1330   K 3.8 11/16/2023 1330   CL 107 11/16/2023 1330   CO2 26 11/16/2023 1330   GLUCOSE 106 (H) 11/16/2023 1330   BUN 11 12/31/2023 0838   CREATININE 0.86 12/31/2023 0838   CALCIUM  9.6 11/16/2023 1330   PROT 6.1 (L) 09/30/2023 0527   ALBUMIN 3.5 09/30/2023 0527   AST 20 09/30/2023 0527   ALT 21 09/30/2023 0527   ALKPHOS 72 09/30/2023 0527   BILITOT 0.7 09/30/2023 0527   GFRNONAA >60 12/31/2023 0838     No results found.     Assessment & Plan:   1. Peripheral arterial disease with history of revascularization (Primary) Post-angiogram the patient's pain has resolved in the right lower extremity.  The right lower extremity angiogram was actually diagnostic and no intervention was done.  Unfortunately I do not have a good reason as to why his pain has resolved but we are happy that it is resolved.  We will continue with routine follow-up and have the  patient return in 3 months for noninvasive studies.  Studies also note a greater than 50% stenosis of the left common iliac artery.  Currently does not have any symptoms and so again this will also continue to be monitored.  2. Essential hypertension Continue antihypertensive medications as already ordered, these medications have been reviewed and there are no changes at this time.  3.  Iliac artery aneurysm, left Patient's iliac artery aneurysm size remains stable at 1.7 x 2.2 which is consistent with his previous studies done on 02/27/2023.  We will continue with monitoring.  4. Mixed hyperlipidemia Continue statin as ordered and reviewed, no changes at this time   Current Outpatient Medications on File Prior to Visit  Medication Sig Dispense Refill   apixaban  (ELIQUIS ) 5 MG TABS tablet Take 1 tablet (5 mg total) by mouth 2 (two) times daily. 60 tablet 3   atorvastatin  (LIPITOR ) 80 MG tablet Take 1 tablet (80 mg total) by mouth daily at 6 PM. 90 tablet 3   clopidogrel  (PLAVIX ) 75 MG tablet Take 1 tablet (75 mg total) by mouth daily. 30 tablet 11   ezetimibe  (ZETIA ) 10 MG tablet Take 1 tablet (10 mg total) by mouth daily. 90 tablet 3   isosorbide  mononitrate (IMDUR ) 30 MG 24 hr tablet Take 1 tablet (30 mg total) by mouth 2 (two) times daily. 180 tablet 3   pantoprazole  (PROTONIX ) 20 MG tablet Take 1 tablet (20 mg total) by mouth daily. 90 tablet 3   [Paused] metoprolol  succinate (TOPROL  XL) 25 MG 24 hr tablet Take 1 tablet (25 mg total) by mouth daily. 90 tablet 3   No current facility-administered medications on file prior to visit.    There are no Patient Instructions on file for this visit. No follow-ups on file.   Naszir Cott E Balian Schaller, NP

## 2024-02-15 LAB — VAS US ABI WITH/WO TBI
Left ABI: 1.2
Right ABI: 1.13

## 2024-02-29 ENCOUNTER — Encounter (INDEPENDENT_AMBULATORY_CARE_PROVIDER_SITE_OTHER)

## 2024-02-29 ENCOUNTER — Ambulatory Visit (INDEPENDENT_AMBULATORY_CARE_PROVIDER_SITE_OTHER): Admitting: Nurse Practitioner

## 2024-03-25 ENCOUNTER — Encounter

## 2024-03-25 ENCOUNTER — Ambulatory Visit: Admitting: Pulmonary Disease

## 2024-05-09 ENCOUNTER — Other Ambulatory Visit (INDEPENDENT_AMBULATORY_CARE_PROVIDER_SITE_OTHER): Payer: Self-pay | Admitting: Nurse Practitioner

## 2024-05-09 DIAGNOSIS — I723 Aneurysm of iliac artery: Secondary | ICD-10-CM

## 2024-05-09 DIAGNOSIS — Z9889 Other specified postprocedural states: Secondary | ICD-10-CM

## 2024-05-13 ENCOUNTER — Ambulatory Visit (INDEPENDENT_AMBULATORY_CARE_PROVIDER_SITE_OTHER): Admitting: Nurse Practitioner

## 2024-05-13 ENCOUNTER — Other Ambulatory Visit (INDEPENDENT_AMBULATORY_CARE_PROVIDER_SITE_OTHER)

## 2024-05-13 ENCOUNTER — Encounter (INDEPENDENT_AMBULATORY_CARE_PROVIDER_SITE_OTHER): Payer: Self-pay | Admitting: Nurse Practitioner

## 2024-05-13 VITALS — BP 113/71 | HR 72 | Resp 17 | Ht 74.0 in | Wt 199.4 lb

## 2024-05-13 DIAGNOSIS — Z9889 Other specified postprocedural states: Secondary | ICD-10-CM

## 2024-05-13 DIAGNOSIS — I10 Essential (primary) hypertension: Secondary | ICD-10-CM

## 2024-05-13 DIAGNOSIS — I723 Aneurysm of iliac artery: Secondary | ICD-10-CM

## 2024-05-13 DIAGNOSIS — I739 Peripheral vascular disease, unspecified: Secondary | ICD-10-CM | POA: Diagnosis not present

## 2024-05-13 NOTE — Progress Notes (Signed)
 "  Subjective:    Patient ID: Aaron Knox, male    DOB: 12/03/1955, 69 y.o.   MRN: [id number] Chief Complaint  Patient presents with   Follow-up    3 months + ABI + Iliac     HPI  Discussed the use of AI scribe software for clinical note transcription with the patient, who gave verbal consent to proceed.  History of Present Illness Aaron Knox is a 69 year old male with peripheral arterial disease and left iliac artery aneurysm who presents for follow-up of recurrent left leg claudication.  Over the past two weeks, he has experienced mild recurrence of exertional left calf discomfort, described as sensations similar to those prior to previous intervention. Symptoms are provoked by walking and resolve with brief rest, allowing resumption of activity for 30-50 minutes before recurrence. Standing does not provoke symptoms. He denies right leg symptoms. 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  Recent diagnostic studies include ankle-brachial index measurements, with the right leg decreasing from 1.13 to 1.07 and the left leg from 1.20 to 0.98, both within normal range.    Results Diagnostic ABI right leg (05/13/2024): 1.07, decreased from 1.13 on prior study ABI left leg (05/13/2024): 0.98, decreased from 1.20 on prior study Lower extremity vascular imaging right (05/13/2024): Patent vessels, stents intact, no significant stenosis Lower extremity vascular imaging left (05/13/2024): >50% stenosis in the left iliac artery, mild aneurysmal dilation of 1.7 cm   Review of Systems  Cardiovascular:  Negative for leg swelling.       Claudication  Skin:  Negative for wound.  All other systems reviewed and are negative.      Objective:   Physical Exam Vitals reviewed.  HENT:     Head: Normocephalic.  Cardiovascular:     Rate and Rhythm: Normal rate.     Pulses:           Dorsalis pedis pulses are detected w/ Doppler on the right side and detected w/ Doppler on the left side.       Posterior tibial pulses are detected w/ Doppler on the right side and detected w/ Doppler on the left side.  Pulmonary:     Effort: Pulmonary effort is normal.  Skin:    General: Skin is warm and dry.  Neurological:     Mental Status: He is alert and oriented to person, place, and time.  Psychiatric:        Mood and Affect: Mood normal.        Behavior: Behavior normal.        Thought Content: Thought content normal.        Judgment: Judgment normal.     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)    CAD in native artery    a.) LHC/PCI 08/09/2014 (setting of STEMI): 30% mLAD, 99% pLCx (3.0 x 15 mm Resolute Integrity DES x 1 with 10% residual post-intervention stenosis), 45% mLCx, 20% pRCA, 40% dRCA); b.) LHC 01/29/2018: 30% mLAD, 45% mLCx, 50% pRCA, 80% dRCA --> med mgmt   CHF (congestive heart failure) (HCC)    DDD (degenerative disc disease),  lumbar    Diastolic dysfunction    a.) TTE 08/11/2014 (s/p STEMI): EF 45-50%, inf HK, AoV sclerosis, G1DD; b.) TTE 02/28/2018: EF 55-60%, G1DD   History of bilateral cataract extraction 2023   HTN (hypertension)    Hyperlipidemia    Long term current use of antithrombotics/antiplatelets    a.) clopidogrel    ST elevation myocardial infarction (STEMI) of true posterior wall (HCC) 08/09/2014   a.) LHC/PCI 08/09/2014: 3.0 x 15 mm Resolute Integrity DES x 1 pLCx with 10% residual post-intervention stenosis)   Unstable angina (HCC)     Social History   Socioeconomic History   Marital status: Single    Spouse name: Not on file   Number of children: 1   Years of education: Not on file   Highest education level: Not on file  Occupational History    Not on file  Tobacco Use   Smoking status: Some Days    Current packs/day: 0.00    Average packs/day: 1.5 packs/day for 56.3 years (84.5 ttl pk-yrs)    Types: Cigarettes    Start date: 1968    Last attempt to quit: 08/2022    Years since quitting: 1.7   Smokeless tobacco: Never   Tobacco comments:    Smoked 1.5 ppd at his heaviest 09/12/22  Vaping Use   Vaping status: Former   Quit date: 10/30/2023  Substance and Sexual Activity   Alcohol use: Not Currently    Alcohol/week: 3.0 standard drinks of alcohol    Types: 3 Glasses of wine per week    Comment: last drink was Jan. 2025   Drug use: No   Sexual activity: Not on file  Other Topics Concern   Not on file  Social History Narrative   Has fiance   Social Drivers of Health   Tobacco Use: High Risk (05/13/2024)   Patient History    Smoking Tobacco Use: Some Days    Smokeless Tobacco Use: Never    Passive Exposure: Not on file  Financial Resource Strain: Not on file  Food Insecurity: No Food Insecurity (11/14/2023)   Epic    Worried About Programme Researcher, Broadcasting/film/video in the Last Year: Never true    Ran Out of Food in the Last Year: Never true  Transportation Needs: No Transportation Needs (11/14/2023)   Epic    Lack of Transportation (Medical): No    Lack of Transportation (Non-Medical): No  Physical Activity: Not on file  Stress: Not on file  Social Connections: Unknown (11/14/2023)   Social Connection and Isolation Panel    Frequency of Communication with Friends and Family: More than three times a week    Frequency of Social Gatherings with Friends and Family: More than three times a week    Attends Religious Services: Patient declined    Database Administrator or Organizations: Patient declined    Attends Banker Meetings: Patient declined    Marital Status: Living with partner  Intimate Partner Violence: Not At Risk (11/14/2023)   Epic    Fear of Current or Ex-Partner: No    Emotionally Abused: No    Physically  Abused: No    Sexually Abused: No  Depression (PHQ2-9): Not on file  Alcohol Screen: Not on file  Housing: Low Risk (11/14/2023)   Epic    Unable to Pay for Housing in the Last Year: No    Number of Times Moved in the Last Year: 0    Homeless in the Last Year: No  Utilities: Not At Risk (11/14/2023)   Epic    Threatened with loss of utilities: No  Health Literacy: Not on file    Past Surgical History:  Procedure Laterality Date   CARDIAC CATHETERIZATION N/A 08/09/2014   Procedure: Left Heart Cath and Coronary Angiography;  Surgeon: Deatrice DELENA Cage, MD;  Location: [facility];  Service: Cardiovascular;  Laterality: N/A;   CARDIAC CATHETERIZATION N/A 08/09/2014   Procedure: Coronary Stent Intervention;  Surgeon: Deatrice DELENA Cage, MD;  Location: [facility];  Service: Cardiovascular;  Laterality: N/A;   CATARACT EXTRACTION W/ INTRAOCULAR LENS  IMPLANT, BILATERAL Bilateral 2023   CORONARY ANGIOPLASTY  08/09/2014   ENDARTERECTOMY FEMORAL Right 09/20/2022   Procedure: ENDARTERECTOMY FEMORAL;  Surgeon: Marea Selinda RAMAN, MD;  Location: [facility];  Service: Vascular;  Laterality: Right;   HAND SURGERY Left 1980   bone chip with nerve damage   INSERTION OF ILIAC STENT Right 09/20/2022   Procedure: INSERTION OF ILIAC STENT;  Surgeon: Marea Selinda RAMAN, MD;  Location: [facility];  Service: Vascular;  Laterality: Right;   LEFT HEART CATH AND CORONARY ANGIOGRAPHY N/A 01/29/2018   Procedure: LEFT HEART CATH AND CORONARY ANGIOGRAPHY;  Surgeon: Mady Bruckner, MD;  Location: [facility];  Service: Cardiovascular;  Laterality: N/A;   LOWER EXTREMITY ANGIOGRAPHY Right 08/07/2022   Procedure: Lower Extremity Angiography;  Surgeon: Marea Selinda RAMAN, MD;  Location: [facility];  Service: Cardiovascular;  Laterality: Right;   LOWER EXTREMITY ANGIOGRAPHY Right 01/12/2023   Procedure: Lower Extremity Angiography;  Surgeon: Marea Selinda RAMAN, MD;  Location: [facility];  Service:  Cardiovascular;  Laterality: Right;   LOWER EXTREMITY ANGIOGRAPHY Right 10/01/2023   Procedure: Lower Extremity Angiography;  Surgeon: Marea Selinda RAMAN, MD;  Location: [facility];  Service: Cardiovascular;  Laterality: Right;   LOWER EXTREMITY ANGIOGRAPHY Right 11/15/2023   Procedure: Lower Extremity Angiography;  Surgeon: Marea Selinda RAMAN, MD;  Location: [facility];  Service: Cardiovascular;  Laterality: Right;   LOWER EXTREMITY ANGIOGRAPHY Right 12/31/2023   Procedure: Lower Extremity Angiography;  Surgeon: Marea Selinda RAMAN, MD;  Location: [facility];  Service: Cardiovascular;  Laterality: Right;   nerve reconstruction Left 1980   hand    Family History  Problem Relation Age of Onset   CAD Maternal Grandfather     Allergies[1]     Latest Ref Rng & Units 11/16/2023    1:30 PM 11/13/2023    4:29 PM 10/08/2023    8:49 AM  CBC  WBC 4.0 - 10.5 K/uL 6.1  4.6  4.9   Hemoglobin 13.0 - 17.0 g/dL 87.0  87.8  86.3   Hematocrit 39.0 - 52.0 % 38.4  35.6  40.4   Platelets 150 - 400 K/uL 192  181  205       CMP     Component Value Date/Time   NA 142 11/16/2023 1330   K 3.8 11/16/2023 1330   CL 107 11/16/2023 1330   CO2 26 11/16/2023 1330   GLUCOSE 106 (H) 11/16/2023 1330   BUN 11 12/31/2023 0838   CREATININE 0.86 12/31/2023 0838   CALCIUM  9.6 11/16/2023 1330   PROT 6.1 (L) 09/30/2023 0527   ALBUMIN 3.5 09/30/2023 0527   AST 20 09/30/2023 0527   ALT 21 09/30/2023 0527   ALKPHOS 72 09/30/2023 0527   BILITOT 0.7 09/30/2023 0527   GFRNONAA >60 12/31/2023 0838     No results found.  Assessment & Plan:   1. Iliac aneurysm (Primary) Left iliac artery aneurysm Mildly aneurysmal and ectatic left iliac artery with >50% stenosis. Anatomy requires complex endovascular approach. Early intervention planned. Discussed procedural risks and recovery expectations. - Discussed need for preoperative CT scan to further characterize anatomy and plan intervention. - Ordered CT  scan of abdomen and pelvis. - Discussed need for cardiac clearance prior to intervention; advised notification of cardiologist at upcoming appointment. - Planned follow-up consultation after CT and cardiac clearance to finalize operative plan. - CT Angio Abd/Pel w/ and/or w/o; Future    2. Peripheral arterial disease with history of revascularization Peripheral arterial disease with claudication, left leg Mild recurrence of left calf claudication with >50% stenosis in left iliac artery. ABI decreased but within normal limits. Disease not critical; physical activity promotes collateralization. - Reviewed recent ABI and imaging findings. - Advised continuation of current activity level and walking regimen.  3. Essential hypertension  Continue antihypertensive medications as already ordered, these medications have been reviewed and there are no changes at this time.   Medications Ordered Prior to Encounter[2]  There are no Patient Instructions on file for this visit. No follow-ups on file.   Giovonnie Trettel E Frady Taddeo, NP      [1]  Allergies Allergen Reactions   Aspirin  Anaphylaxis   Penicillins Other (See Comments)    Childhood allergy per pt's mother. He does not recall any details of the reaction. Willing to try beta lactams.  [2]  Current Outpatient Medications on File Prior to Visit  Medication Sig Dispense Refill   apixaban  (ELIQUIS ) 5 MG TABS tablet Take 1 tablet (5 mg total) by mouth 2 (two) times daily. 60 tablet 3   atorvastatin  (LIPITOR ) 80 MG tablet Take 1 tablet (80 mg total) by mouth daily at 6 PM. 90 tablet 3   clopidogrel  (PLAVIX ) 75 MG tablet Take 1 tablet (75 mg total) by mouth daily. 30 tablet 11   ezetimibe  (ZETIA ) 10 MG tablet Take 1 tablet (10 mg total) by mouth daily. 90 tablet 3   isosorbide  mononitrate (IMDUR ) 30 MG 24 hr tablet Take 1 tablet (30 mg total) by mouth 2 (two) times daily. 180 tablet 3   pantoprazole  (PROTONIX ) 20 MG tablet Take 1 tablet (20 mg total)  by mouth daily. 90 tablet 3   [Paused] metoprolol  succinate (TOPROL  XL) 25 MG 24 hr tablet Take 1 tablet (25 mg total) by mouth daily. (Patient not taking: Reported on 05/13/2024) 90 tablet 3   No current facility-administered medications on file prior to visit.   "

## 2024-05-15 LAB — VAS US ABI WITH/WO TBI
Left ABI: 0.98
Right ABI: 1.07

## 2024-05-21 ENCOUNTER — Ambulatory Visit

## 2024-05-21 ENCOUNTER — Ambulatory Visit: Admitting: Medical
